# Patient Record
Sex: Female | Born: 1940 | Race: White | Hispanic: No | Marital: Married | Smoking: Never smoker
Health system: Southern US, Community
[De-identification: ages and names within clinical notes are randomized; demographics above are authoritative.]

## PROBLEM LIST (undated history)

## (undated) DIAGNOSIS — Z9989 Dependence on other enabling machines and devices: Secondary | ICD-10-CM

## (undated) DIAGNOSIS — A498 Other bacterial infections of unspecified site: Secondary | ICD-10-CM

## (undated) DIAGNOSIS — S72009A Fracture of unspecified part of neck of unspecified femur, initial encounter for closed fracture: Secondary | ICD-10-CM

## (undated) HISTORY — PX: SKIN BIOPSY: SHX1

## (undated) HISTORY — PX: TONSILLECTOMY: SUR1361

---

## 2006-10-15 ENCOUNTER — Ambulatory Visit (HOSPITAL_COMMUNITY): Admission: RE | Admit: 2006-10-15 | Discharge: 2006-10-15 | Payer: Self-pay | Admitting: Internal Medicine

## 2008-10-06 DIAGNOSIS — A498 Other bacterial infections of unspecified site: Secondary | ICD-10-CM

## 2008-10-06 HISTORY — DX: Other bacterial infections of unspecified site: A49.8

## 2012-11-23 ENCOUNTER — Other Ambulatory Visit (HOSPITAL_COMMUNITY): Payer: Self-pay | Admitting: Internal Medicine

## 2012-11-23 DIAGNOSIS — Z139 Encounter for screening, unspecified: Secondary | ICD-10-CM

## 2012-12-02 ENCOUNTER — Ambulatory Visit (HOSPITAL_COMMUNITY)
Admission: RE | Admit: 2012-12-02 | Discharge: 2012-12-02 | Disposition: A | Discharge status: Home / Self Care | Payer: Medicare Other | Source: Ambulatory Visit | Attending: Internal Medicine | Admitting: Internal Medicine

## 2012-12-02 DIAGNOSIS — Z1231 Encounter for screening mammogram for malignant neoplasm of breast: Secondary | ICD-10-CM | POA: Insufficient documentation

## 2012-12-02 DIAGNOSIS — Z139 Encounter for screening, unspecified: Secondary | ICD-10-CM

## 2012-12-20 ENCOUNTER — Inpatient Hospital Stay (HOSPITAL_COMMUNITY)
Admission: EM | Admit: 2012-12-20 | Discharge: 2012-12-24 | DRG: 481 | Disposition: A | Discharge status: Skilled Nursing Facility | Payer: Medicare Other | Attending: Internal Medicine | Admitting: Internal Medicine

## 2012-12-20 ENCOUNTER — Encounter (HOSPITAL_COMMUNITY): Payer: Self-pay | Admitting: *Deleted

## 2012-12-20 ENCOUNTER — Emergency Department (HOSPITAL_COMMUNITY): Payer: Medicare Other

## 2012-12-20 DIAGNOSIS — S72141A Displaced intertrochanteric fracture of right femur, initial encounter for closed fracture: Secondary | ICD-10-CM

## 2012-12-20 DIAGNOSIS — Z23 Encounter for immunization: Secondary | ICD-10-CM

## 2012-12-20 DIAGNOSIS — S72001A Fracture of unspecified part of neck of right femur, initial encounter for closed fracture: Secondary | ICD-10-CM

## 2012-12-20 DIAGNOSIS — S72009A Fracture of unspecified part of neck of unspecified femur, initial encounter for closed fracture: Principal | ICD-10-CM | POA: Diagnosis present

## 2012-12-20 DIAGNOSIS — W010XXA Fall on same level from slipping, tripping and stumbling without subsequent striking against object, initial encounter: Secondary | ICD-10-CM | POA: Diagnosis present

## 2012-12-20 DIAGNOSIS — S72141S Displaced intertrochanteric fracture of right femur, sequela: Secondary | ICD-10-CM

## 2012-12-20 DIAGNOSIS — Y92009 Unspecified place in unspecified non-institutional (private) residence as the place of occurrence of the external cause: Secondary | ICD-10-CM

## 2012-12-20 DIAGNOSIS — D62 Acute posthemorrhagic anemia: Secondary | ICD-10-CM | POA: Diagnosis not present

## 2012-12-20 HISTORY — DX: Other bacterial infections of unspecified site: A49.8

## 2012-12-20 NOTE — ED Notes (Signed)
Pt fell onto tile floor on rt side, co pain to rt hip, no leg deformities noted

## 2012-12-21 ENCOUNTER — Inpatient Hospital Stay (HOSPITAL_COMMUNITY): Payer: Medicare Other

## 2012-12-21 ENCOUNTER — Encounter (HOSPITAL_COMMUNITY)
Admission: EM | Disposition: A | Discharge status: Skilled Nursing Facility | Payer: Self-pay | Source: Home / Self Care | Attending: Internal Medicine

## 2012-12-21 ENCOUNTER — Encounter (HOSPITAL_COMMUNITY): Payer: Self-pay | Admitting: Internal Medicine

## 2012-12-21 ENCOUNTER — Encounter (HOSPITAL_COMMUNITY): Payer: Self-pay | Admitting: Anesthesiology

## 2012-12-21 ENCOUNTER — Inpatient Hospital Stay (HOSPITAL_COMMUNITY): Payer: Medicare Other | Admitting: Anesthesiology

## 2012-12-21 DIAGNOSIS — S72009A Fracture of unspecified part of neck of unspecified femur, initial encounter for closed fracture: Principal | ICD-10-CM

## 2012-12-21 DIAGNOSIS — S72141A Displaced intertrochanteric fracture of right femur, initial encounter for closed fracture: Secondary | ICD-10-CM

## 2012-12-21 HISTORY — PX: INTRAMEDULLARY (IM) NAIL INTERTROCHANTERIC: SHX5875

## 2012-12-21 LAB — BASIC METABOLIC PANEL
CO2: 25 mEq/L (ref 19–32)
Chloride: 103 mEq/L (ref 96–112)
GFR calc Af Amer: >90 mL/min (ref >90)

## 2012-12-21 LAB — TYPE AND SCREEN
ABO/RH(D): A POS
Antibody Screen: NEGATIVE

## 2012-12-21 LAB — URINALYSIS, MICROSCOPIC ONLY
Glucose, UA: NEGATIVE mg/dL
Ketones, ur: NEGATIVE mg/dL
Nitrite: NEGATIVE
Urobilinogen, UA: 0.2 mg/dL (ref 0.0–1.0)

## 2012-12-21 LAB — PROTIME-INR
INR: 0.92 (ref 0.00–1.49)
Prothrombin Time: 12.3 seconds (ref 11.6–15.2)

## 2012-12-21 LAB — CBC WITH DIFFERENTIAL/PLATELET
Basophils Relative: 0 % (ref 0–1)
Eosinophils Relative: 1 % (ref 0–5)
HCT: 42.7 % (ref 36.0–46.0)
Lymphocytes Relative: 19 % (ref 12–46)
Lymphs Abs: 1.8 10*3/uL (ref 0.7–4.0)
MCHC: 33.5 g/dL (ref 30.0–36.0)
Monocytes Absolute: 0.6 10*3/uL (ref 0.1–1.0)
RDW: 13.4 % (ref 11.5–15.5)

## 2012-12-21 LAB — SURGICAL PCR SCREEN
MRSA, PCR: NEGATIVE
Staphylococcus aureus: NEGATIVE

## 2012-12-21 SURGERY — FIXATION, FRACTURE, INTERTROCHANTERIC, WITH INTRAMEDULLARY ROD
Anesthesia: Spinal | Site: Hip | Laterality: Right | Wound class: Clean

## 2012-12-21 MED ORDER — CEFAZOLIN SODIUM-DEXTROSE 2-3 GM-% IV SOLR
INTRAVENOUS | Status: DC | PRN
  Administered 2012-12-21: 2 g via INTRAVENOUS

## 2012-12-21 MED ORDER — FENTANYL CITRATE 0.05 MG/ML IJ SOLN
25.0000 ug | INTRAMUSCULAR | Status: DC | PRN
  Administered 2012-12-21: 25 ug via INTRAVENOUS

## 2012-12-21 MED ORDER — ENOXAPARIN SODIUM 40 MG/0.4ML ~~LOC~~ SOLN
40.0000 mg | SUBCUTANEOUS | Status: DC
  Administered 2012-12-22 – 2012-12-24 (×3): 40 mg via SUBCUTANEOUS
  Filled 2012-12-21 (×3): qty 0.4

## 2012-12-21 MED ORDER — CHLORHEXIDINE GLUCONATE 4 % EX LIQD
60.0000 mL | Freq: Once | CUTANEOUS | Status: DC
  Filled 2012-12-21: qty 60

## 2012-12-21 MED ORDER — CEFAZOLIN SODIUM-DEXTROSE 2-3 GM-% IV SOLR
INTRAVENOUS | Status: AC
  Filled 2012-12-21: qty 50

## 2012-12-21 MED ORDER — METOCLOPRAMIDE HCL 5 MG/ML IJ SOLN: 5.0000 mg | Freq: Three times a day (TID) | INTRAMUSCULAR | Status: DC | PRN

## 2012-12-21 MED ORDER — DOCUSATE SODIUM 100 MG PO CAPS
100.0000 mg | ORAL_CAPSULE | Freq: Two times a day (BID) | ORAL | Status: DC
  Filled 2012-12-21: qty 1

## 2012-12-21 MED ORDER — METHOCARBAMOL 100 MG/ML IJ SOLN
500.0000 mg | Freq: Four times a day (QID) | INTRAVENOUS | Status: DC | PRN
  Filled 2012-12-21: qty 5

## 2012-12-21 MED ORDER — PROPOFOL INFUSION 10 MG/ML OPTIME
INTRAVENOUS | Status: DC | PRN
  Administered 2012-12-21: 25 ug/kg/min via INTRAVENOUS

## 2012-12-21 MED ORDER — SENNA 8.6 MG PO TABS
1.0000 | ORAL_TABLET | Freq: Two times a day (BID) | ORAL | Status: DC
  Administered 2012-12-21 – 2012-12-24 (×5): 8.6 mg via ORAL
  Filled 2012-12-21 (×5): qty 1

## 2012-12-21 MED ORDER — ONDANSETRON HCL 4 MG/2ML IJ SOLN: 4.0000 mg | Freq: Four times a day (QID) | INTRAMUSCULAR | Status: DC | PRN

## 2012-12-21 MED ORDER — MIDAZOLAM HCL 2 MG/2ML IJ SOLN
INTRAMUSCULAR | Status: AC
  Filled 2012-12-21: qty 2

## 2012-12-21 MED ORDER — PNEUMOCOCCAL VAC POLYVALENT 25 MCG/0.5ML IJ INJ
0.5000 mL | INJECTION | INTRAMUSCULAR | Status: DC
  Filled 2012-12-21: qty 0.5

## 2012-12-21 MED ORDER — FENTANYL CITRATE 0.05 MG/ML IJ SOLN
INTRAMUSCULAR | Status: DC | PRN
  Administered 2012-12-21: 25 ug via INTRATHECAL

## 2012-12-21 MED ORDER — HYDROCODONE-ACETAMINOPHEN 7.5-325 MG PO TABS
1.0000 | ORAL_TABLET | Freq: Once | ORAL | Status: AC
  Administered 2012-12-21: 1 via ORAL

## 2012-12-21 MED ORDER — EPHEDRINE SULFATE 50 MG/ML IJ SOLN
INTRAMUSCULAR | Status: DC | PRN
  Administered 2012-12-21 (×2): 10 mg via INTRAVENOUS

## 2012-12-21 MED ORDER — CEFAZOLIN SODIUM-DEXTROSE 2-3 GM-% IV SOLR: 2.0000 g | INTRAVENOUS | Status: DC

## 2012-12-21 MED ORDER — HYDROMORPHONE HCL PF 1 MG/ML IJ SOLN
1.0000 mg | INTRAMUSCULAR | Status: DC | PRN
  Administered 2012-12-21 (×2): 1 mg via INTRAVENOUS
  Filled 2012-12-21 (×2): qty 1

## 2012-12-21 MED ORDER — HYDROCODONE-ACETAMINOPHEN 7.5-325 MG PO TABS
ORAL_TABLET | ORAL | Status: AC
  Filled 2012-12-21: qty 1

## 2012-12-21 MED ORDER — ONDANSETRON HCL 4 MG/2ML IJ SOLN
4.0000 mg | Freq: Four times a day (QID) | INTRAMUSCULAR | Status: DC | PRN
  Administered 2012-12-22: 4 mg via INTRAVENOUS
  Filled 2012-12-21: qty 2

## 2012-12-21 MED ORDER — SENNOSIDES-DOCUSATE SODIUM 8.6-50 MG PO TABS
1.0000 | ORAL_TABLET | Freq: Every evening | ORAL | Status: DC | PRN
  Administered 2012-12-22: 1 via ORAL
  Filled 2012-12-21: qty 1

## 2012-12-21 MED ORDER — HYDROCODONE-ACETAMINOPHEN 5-325 MG PO TABS
1.0000 | ORAL_TABLET | Freq: Four times a day (QID) | ORAL | Status: DC | PRN
  Administered 2012-12-22 (×2): 1 via ORAL
  Filled 2012-12-21 (×3): qty 1

## 2012-12-21 MED ORDER — LACTATED RINGERS IV SOLN
INTRAVENOUS | Status: DC
  Administered 2012-12-21 (×2): via INTRAVENOUS

## 2012-12-21 MED ORDER — DEXTROSE-NACL 5-0.9 % IV SOLN
INTRAVENOUS | Status: DC
  Administered 2012-12-21: 03:00:00 via INTRAVENOUS

## 2012-12-21 MED ORDER — ONDANSETRON HCL 4 MG/2ML IJ SOLN
4.0000 mg | Freq: Once | INTRAMUSCULAR | Status: AC
  Administered 2012-12-21: 4 mg via INTRAVENOUS
  Filled 2012-12-21: qty 2

## 2012-12-21 MED ORDER — FENTANYL CITRATE 0.05 MG/ML IJ SOLN
INTRAMUSCULAR | Status: AC
  Filled 2012-12-21: qty 2

## 2012-12-21 MED ORDER — ACETAMINOPHEN 10 MG/ML IV SOLN
500.0000 mg | Freq: Once | INTRAVENOUS | Status: AC
  Administered 2012-12-21: 500 mg via INTRAVENOUS

## 2012-12-21 MED ORDER — BUPIVACAINE IN DEXTROSE 0.75-8.25 % IT SOLN
INTRATHECAL | Status: AC
  Filled 2012-12-21: qty 2

## 2012-12-21 MED ORDER — SODIUM CHLORIDE 0.9 % IR SOLN
Status: DC | PRN
  Administered 2012-12-21: 1000 mL

## 2012-12-21 MED ORDER — MUPIROCIN 2 % EX OINT
TOPICAL_OINTMENT | Freq: Two times a day (BID) | CUTANEOUS | Status: DC
  Administered 2012-12-21: 14:00:00 via NASAL

## 2012-12-21 MED ORDER — ONDANSETRON HCL 4 MG/2ML IJ SOLN: 4.0000 mg | Freq: Four times a day (QID) | INTRAMUSCULAR | Status: DC

## 2012-12-21 MED ORDER — HYDROCODONE-ACETAMINOPHEN 5-325 MG PO TABS: 1.0000 | ORAL_TABLET | ORAL | Status: DC | PRN

## 2012-12-21 MED ORDER — SODIUM CHLORIDE 0.9 % IV SOLN: 1000.0000 mL | INTRAVENOUS | Status: DC

## 2012-12-21 MED ORDER — PHENOL 1.4 % MT LIQD: 1.0000 | OROMUCOSAL | Status: DC | PRN

## 2012-12-21 MED ORDER — ACETAMINOPHEN 10 MG/ML IV SOLN
INTRAVENOUS | Status: AC
  Filled 2012-12-21: qty 100

## 2012-12-21 MED ORDER — CEFAZOLIN SODIUM-DEXTROSE 2-3 GM-% IV SOLR
INTRAVENOUS | Status: AC
  Filled 2012-12-21: qty 100

## 2012-12-21 MED ORDER — FENTANYL CITRATE 0.05 MG/ML IJ SOLN
50.0000 ug | INTRAMUSCULAR | Status: DC | PRN
  Administered 2012-12-21: 50 ug via INTRAVENOUS
  Filled 2012-12-21: qty 2

## 2012-12-21 MED ORDER — BUPIVACAINE-EPINEPHRINE PF 0.5-1:200000 % IJ SOLN
INTRAMUSCULAR | Status: AC
  Filled 2012-12-21: qty 20

## 2012-12-21 MED ORDER — ONDANSETRON HCL 4 MG PO TABS: 4.0000 mg | ORAL_TABLET | Freq: Four times a day (QID) | ORAL | Status: DC | PRN

## 2012-12-21 MED ORDER — ASPIRIN EC 81 MG PO TBEC: 81.0000 mg | DELAYED_RELEASE_TABLET | Freq: Every day | ORAL | Status: DC

## 2012-12-21 MED ORDER — MUPIROCIN 2 % EX OINT
TOPICAL_OINTMENT | CUTANEOUS | Status: AC
  Filled 2012-12-21: qty 22

## 2012-12-21 MED ORDER — BUPIVACAINE IN DEXTROSE 0.75-8.25 % IT SOLN
INTRATHECAL | Status: DC | PRN
  Administered 2012-12-21: 15 mg via INTRATHECAL

## 2012-12-21 MED ORDER — MIDAZOLAM HCL 2 MG/2ML IJ SOLN
1.0000 mg | INTRAMUSCULAR | Status: DC | PRN
  Administered 2012-12-21: 2 mg via INTRAVENOUS

## 2012-12-21 MED ORDER — METHOCARBAMOL 500 MG PO TABS
500.0000 mg | ORAL_TABLET | Freq: Four times a day (QID) | ORAL | Status: DC | PRN
  Administered 2012-12-22 – 2012-12-24 (×7): 500 mg via ORAL
  Filled 2012-12-21 (×9): qty 1

## 2012-12-21 MED ORDER — METOCLOPRAMIDE HCL 10 MG PO TABS: 5.0000 mg | ORAL_TABLET | Freq: Three times a day (TID) | ORAL | Status: DC | PRN

## 2012-12-21 MED ORDER — CEFAZOLIN SODIUM-DEXTROSE 2-3 GM-% IV SOLR
2.0000 g | Freq: Four times a day (QID) | INTRAVENOUS | Status: AC
  Administered 2012-12-21 – 2012-12-22 (×2): 2 g via INTRAVENOUS
  Filled 2012-12-21 (×2): qty 50

## 2012-12-21 MED ORDER — MAGNESIUM CITRATE PO SOLN: 1.0000 | Freq: Once | ORAL | Status: AC | PRN

## 2012-12-21 MED ORDER — PHENYLEPHRINE HCL 10 MG/ML IJ SOLN
INTRAMUSCULAR | Status: DC | PRN
  Administered 2012-12-21: 30 ug via INTRAVENOUS
  Administered 2012-12-21: 50 ug via INTRAVENOUS
  Administered 2012-12-21: 20 ug via INTRAVENOUS
  Administered 2012-12-21: 50 ug via INTRAVENOUS

## 2012-12-21 MED ORDER — DOCUSATE SODIUM 100 MG PO CAPS
100.0000 mg | ORAL_CAPSULE | Freq: Two times a day (BID) | ORAL | Status: DC
  Administered 2012-12-21 – 2012-12-24 (×6): 100 mg via ORAL
  Filled 2012-12-21 (×6): qty 1

## 2012-12-21 MED ORDER — BUPIVACAINE-EPINEPHRINE 0.5% -1:200000 IJ SOLN
INTRAMUSCULAR | Status: DC | PRN
  Administered 2012-12-21: 60 mL

## 2012-12-21 MED ORDER — FENTANYL CITRATE 0.05 MG/ML IJ SOLN: 25.0000 ug | INTRAMUSCULAR | Status: DC | PRN

## 2012-12-21 MED ORDER — EPHEDRINE SULFATE 50 MG/ML IJ SOLN
INTRAMUSCULAR | Status: AC
  Filled 2012-12-21: qty 1

## 2012-12-21 MED ORDER — BISACODYL 5 MG PO TBEC: 5.0000 mg | DELAYED_RELEASE_TABLET | Freq: Every day | ORAL | Status: DC | PRN

## 2012-12-21 MED ORDER — PROPOFOL 10 MG/ML IV EMUL
INTRAVENOUS | Status: AC
  Filled 2012-12-21: qty 20

## 2012-12-21 MED ORDER — ONDANSETRON HCL 4 MG/2ML IJ SOLN
4.0000 mg | Freq: Once | INTRAMUSCULAR | Status: AC | PRN
  Administered 2012-12-21: 4 mg via INTRAVENOUS

## 2012-12-21 MED ORDER — HYDROMORPHONE HCL PF 1 MG/ML IJ SOLN: 0.1000 mg | INTRAMUSCULAR | Status: DC | PRN

## 2012-12-21 MED ORDER — MENTHOL 3 MG MT LOZG: 1.0000 | LOZENGE | OROMUCOSAL | Status: DC | PRN

## 2012-12-21 MED ORDER — LACTATED RINGERS IV SOLN
INTRAVENOUS | Status: DC
  Administered 2012-12-21 – 2012-12-23 (×4): via INTRAVENOUS

## 2012-12-21 MED ORDER — SODIUM CHLORIDE 0.9 % IV SOLN
1000.0000 mL | INTRAVENOUS | Status: DC
  Administered 2012-12-21: 1000 mL via INTRAVENOUS

## 2012-12-21 MED ORDER — HYDROCODONE-ACETAMINOPHEN 5-325 MG PO TABS
1.0000 | ORAL_TABLET | ORAL | Status: DC
  Administered 2012-12-21 – 2012-12-22 (×8): 1 via ORAL
  Filled 2012-12-21 (×7): qty 1

## 2012-12-21 MED ORDER — ALUM & MAG HYDROXIDE-SIMETH 200-200-20 MG/5ML PO SUSP: 30.0000 mL | ORAL | Status: DC | PRN

## 2012-12-21 MED ORDER — ONDANSETRON HCL 4 MG/2ML IJ SOLN
INTRAMUSCULAR | Status: AC
  Filled 2012-12-21: qty 2

## 2012-12-21 SURGICAL SUPPLY — 50 items
BAG HAMPER (MISCELLANEOUS) ×2 IMPLANT
BANDAGE GAUZE ELAST BULKY 4 IN (GAUZE/BANDAGES/DRESSINGS) IMPLANT
BIT DRILL AO GAMMA 4.2X300 (BIT) ×2 IMPLANT
BLADE SURG SZ10 CARB STEEL (BLADE) ×4 IMPLANT
CHLORAPREP W/TINT 26ML (MISCELLANEOUS) ×2 IMPLANT
CLOTH BEACON ORANGE TIMEOUT ST (SAFETY) ×2 IMPLANT
COVER LIGHT HANDLE STERIS (MISCELLANEOUS) ×4 IMPLANT
COVER MAYO STAND XLG (DRAPE) ×2 IMPLANT
DECANTER SPIKE VIAL GLASS SM (MISCELLANEOUS) ×4 IMPLANT
DRAPE STERI IOBAN 125X83 (DRAPES) ×2 IMPLANT
DRSG MEPILEX BORDER 4X12 (GAUZE/BANDAGES/DRESSINGS) ×2 IMPLANT
ELECT REM PT RETURN 9FT ADLT (ELECTROSURGICAL) ×2
ELECTRODE REM PT RTRN 9FT ADLT (ELECTROSURGICAL) ×1 IMPLANT
GLOVE ECLIPSE 6.5 STRL STRAW (GLOVE) ×2 IMPLANT
GLOVE ECLIPSE 7.0 STRL STRAW (GLOVE) ×2 IMPLANT
GLOVE EXAM NITRILE MD LF STRL (GLOVE) ×2 IMPLANT
GLOVE INDICATOR 7.0 STRL GRN (GLOVE) ×2 IMPLANT
GLOVE INDICATOR 7.5 STRL GRN (GLOVE) ×2 IMPLANT
GLOVE SKINSENSE NS SZ8.0 LF (GLOVE) ×1
GLOVE SKINSENSE STRL SZ8.0 LF (GLOVE) ×1 IMPLANT
GLOVE SS N UNI LF 8.5 STRL (GLOVE) ×2 IMPLANT
GOWN STRL REIN XL XLG (GOWN DISPOSABLE) ×8 IMPLANT
GUIDEROD T2 3X1000 (ROD) ×2 IMPLANT
INST SET MAJOR BONE (KITS) ×2 IMPLANT
K-WIRE  3.2X450M STR (WIRE) ×1
K-WIRE 3.2X450M STR (WIRE) ×1
KIT BLADEGUARD II DBL (SET/KITS/TRAYS/PACK) ×2 IMPLANT
KIT ROOM TURNOVER AP CYSTO (KITS) ×2 IMPLANT
KWIRE 3.2X450M STR (WIRE) ×1 IMPLANT
MANIFOLD NEPTUNE II (INSTRUMENTS) ×2 IMPLANT
MARKER SKIN DUAL TIP RULER LAB (MISCELLANEOUS) ×2 IMPLANT
NAIL TROCH GAMMA 11X18 (Nail) ×2 IMPLANT
NEEDLE HYPO 21X1.5 SAFETY (NEEDLE) ×2 IMPLANT
NEEDLE SPNL 18GX3.5 QUINCKE PK (NEEDLE) ×2 IMPLANT
NS IRRIG 1000ML POUR BTL (IV SOLUTION) ×2 IMPLANT
PACK BASIC III (CUSTOM PROCEDURE TRAY) ×1
PACK SRG BSC III STRL LF ECLPS (CUSTOM PROCEDURE TRAY) ×1 IMPLANT
PENCIL HANDSWITCHING (ELECTRODE) ×2 IMPLANT
SCREW LAG GAMMA 3 TI 10.5X80MM (Screw) ×2 IMPLANT
SCREW LOCKING T2 F/T  5X32.5MM (Screw) ×1 IMPLANT
SCREW LOCKING T2 F/T 5X32.5MM (Screw) ×1 IMPLANT
SET BASIN LINEN APH (SET/KITS/TRAYS/PACK) ×2 IMPLANT
SPONGE LAP 18X18 X RAY DECT (DISPOSABLE) ×4 IMPLANT
STAPLER VISISTAT 35W (STAPLE) ×2 IMPLANT
SUT MNCRL 0 VIOLET CTX 36 (SUTURE) ×2 IMPLANT
SUT MON AB 2-0 CT1 36 (SUTURE) ×2 IMPLANT
SUT MONOCRYL 0 CTX 36 (SUTURE) ×2
SYR 30ML LL (SYRINGE) ×2 IMPLANT
SYR BULB IRRIGATION 50ML (SYRINGE) ×4 IMPLANT
YANKAUER SUCT 12FT TUBE ARGYLE (SUCTIONS) ×2 IMPLANT

## 2012-12-21 NOTE — Anesthesia Postprocedure Evaluation (Signed)
  Anesthesia Post-op Note  Patient: Angela [name]  Procedure(s) Performed: Procedure(s): INTRAMEDULLARY (IM) NAIL INTERTROCHANTRIC (Right)  Patient Location: PACU  Anesthesia Type:Spinal  Level of Consciousness: awake, alert  and patient cooperative  Airway and Oxygen Therapy: Patient Spontanous Breathing  Post-op Pain: 4 /10, mild  Post-op Assessment: Post-op Vital signs reviewed, Patient's Cardiovascular Status Stable, Respiratory Function Stable, RESPIRATORY FUNCTION UNSTABLE, No signs of Nausea or vomiting and Pain level controlled  Post-op Vital Signs: Reviewed and stable  Complications: No apparent anesthesia complications

## 2012-12-21 NOTE — Brief Op Note (Signed)
12/20/2012 - 12/21/2012  4:08 PM  PATIENT:  Angela [name]  72 y.o. female  PRE-OPERATIVE DIAGNOSIS:  right hip fracture, intertrochanteric   POST-OPERATIVE DIAGNOSIS:  right hip fracture, intertrochanteric  PROCEDURE:  Procedure(s): INTRAMEDULLARY (IM) NAIL INTERTROCHANTRIC (Right) Short gamma nail 125 / 80 / 32.5 / proximal screw SURGEON:  Surgeon(s) and Role:    * Vickki Hearing, MD - Primary  PHYSICIAN ASSISTANT:   ASSISTANTS: betty ashley   ANESTHESIA:   spinal  EBL:  Total I/O In: 2187.5 [I.V.:2187.5] Out: 1175 [Urine:1100; Blood:75]  BLOOD ADMINISTERED:none  DRAINS: none   LOCAL MEDICATIONS USED:  MARCAINE   , Amount: 60 ml and OTHER epi  SPECIMEN:  No Specimen  DISPOSITION OF SPECIMEN:  N/A  COUNTS:  YES  TOURNIQUET:  * No tourniquets in log *  DICTATION: .Dragon Dictation  PLAN OF CARE: Admit to inpatient   PATIENT DISPOSITION:  PACU - hemodynamically stable.   Delay start of Pharmacological VTE agent (>24hrs) due to surgical blood loss or risk of bleeding: yes

## 2012-12-21 NOTE — Op Note (Signed)
  After site marking and chart update the patient was taken to the operating room where she received spinal anesthesia. The patient was placed on the fracture table with perineal post. Right leg placed in traction device left leg placed in a padded foot holder and placed in abduction   In traction and manipulation of the limb was performed under C-arm guidance until a closed stable reduction was obtained. The right leg was then prepped and draped sterilely. After completing the timeout a lateral incision was made over the greater trochanter extended proximally. Subcutaneous tissues were divided and the subcutaneous hemorrhage was controlled with electrocautery. Further dissection was carried out to the fascia which was split in line with the skin incision and then blunt dissection was carried out in the abductor musculature into the greater trochanter was palpated. A curved awl was passed into the greater trochanter into the femoral canal. This was confirmed by x-ray. A guidewire was placed into the cannulated awl and passed to the knee and confirmed by x-ray.   Using manufacturer's recommended technique reaming was performed over the guidewire to the level of the lesser trochanter. A 125 nail was passed over the guidewire,  . The nail was then passed . A lateral incision was made in preparation for the lag screw. Using the cannula and perforating drill the guidewire was placed across the fracture site into the femoral head. X-rays confirmed position. The pin was then measured. A 80 mm setting was placed on the reamer and the reamer was passed over the guidewire under C-arm guidance. The 80 mm lag screw was placed. The acorn was then placed proximally. The acorn was placed in a dynamic mode. The acorn was confirmed to be engaged per manufacture a technique.  Traction was then released.  We then made a second stab wound laterally, the cannula for the locking bolt was placed down to bone and the recommended  drill was drilled across the nail. We then passed a 32.5 m locking bolt per direct measurement.   Wounds were then irrigated radiographs confirmed position of the fracture and of the implant.  Layered closure was performed proximally with 0 Monocryl and 2-0 Monocryl followed by reapproximation of skin edges with staples  The 2 distal incisions were closed with staples.  A total of 60 cc of Marcaine was injected into the wound area with 30 cc injected beneath the fascial layer and 30 cc in the subcutaneous layer.  Sterile dressing was applied  The patient was taken to recovery room in stable condition  Postop plan is for full weightbearing.

## 2012-12-21 NOTE — H&P (Signed)
[facility] History and Physical  Angela [name] ZOX:[id number] DOB: 12-08-1940    PCP:   Dwana Melena, MD   Chief Complaint: Mechanical fall, right hip Fx.  HPI: Angela [name] is an 72 y.o. female previously healthy with only home medicine of 81mg  ASA, was walking over the gate at home, holding her dog, tripped and fell on her right hip.  She has pain there.  X ray showed non displaced intertrochanteric fx of the right femoral neck.  Her other work up included a clear CXR, EKG with SNR and no acute ischemic changes, her Hb of 14g/DL, and her renal fx tests were normal.  Dr Monico Blitz of orthopedics was consulted and will see her in the morning.  Patient would like to have Dr Romeo Apple operated on her and Dr Monico Blitz is aware of her request.  Hospitalist was asked to admit her for right hip Fx.  Rewiew of Systems:  Constitutional: Negative for malaise, fever and chills. No significant weight loss or weight gain Eyes: Negative for eye pain, redness and discharge, diplopia, visual changes, or flashes of light. ENMT: Negative for ear pain, hoarseness, nasal congestion, sinus pressure and sore throat. No headaches; tinnitus, drooling, or problem swallowing. Cardiovascular: Negative for chest pain, palpitations, diaphoresis, dyspnea and peripheral edema. ; No orthopnea, PND Respiratory: Negative for cough, hemoptysis, wheezing and stridor. No pleuritic chestpain. Gastrointestinal: Negative for nausea, vomiting, diarrhea, constipation, abdominal pain, melena, blood in stool, hematemesis, jaundice and rectal bleeding.    Genitourinary: Negative for frequency, dysuria, incontinence,flank pain and hematuria; Musculoskeletal: Negative for back pain and neck pain. Negative for swelling and trauma.;  Skin: . Negative for pruritus, rash, abrasions, bruising and skin lesion.; ulcerations Neuro: Negative for headache, lightheadedness and neck stiffness. Negative for weakness, altered level of consciousness ,  altered mental status, extremity weakness, burning feet, involuntary movement, seizure and syncope.  Psych: negative for anxiety, depression, insomnia, tearfulness, panic attacks, hallucinations, paranoia, suicidal or homicidal ideation    Past Medical History  Diagnosis Date  . Clostridium difficile infection 2010    History reviewed. No pertinent past surgical history.  Medications:  HOME MEDS: Prior to Admission medications   Medication Sig Start Date End Date Taking? Authorizing Provider  aspirin 81 MG tablet Take 81 mg by mouth daily.   Yes Historical Provider, MD     Allergies:  No Known Allergies  Social History:   reports that she has never smoked. She does not have any smokeless tobacco history on file. She reports that she does not drink alcohol or use illicit drugs.  Family History: History reviewed. No pertinent family history.   Physical Exam: Filed Vitals:   12/20/12 2305 12/20/12 2310 12/21/12 0245  BP:  146/88 135/67  Pulse:  77 73  Temp: 98 F (36.7 C) 98 F (36.7 C) 98.1 F (36.7 C)  TempSrc: Oral Oral Oral  Resp:  20 15  Height:  5' (1.524 m)   Weight:  55.339 kg (122 lb)   SpO2:  97% 98%   Blood pressure 135/67, pulse 73, temperature 98.1 F (36.7 C), temperature source Oral, resp. rate 15, height 5' (1.524 m), weight 55.339 kg (122 lb), SpO2 98.00%.  GEN:  Pleasant  patient lying in the stretcher in no acute distress; cooperative with exam. PSYCH:  alert and oriented x4; does not appear anxious or depressed; affect is appropriate. HEENT: Mucous membranes pink and anicteric; PERRLA; EOM intact; no cervical lymphadenopathy nor thyromegaly or carotid bruit; no JVD; There were no  stridor. Neck is very supple. Breasts:: Not examined CHEST WALL: No tenderness CHEST: Normal respiration, clear to auscultation bilaterally.  HEART: Regular rate and rhythm.  There are no murmur, rub, or gallops.   BACK: No kyphosis or scoliosis; no CVA  tenderness ABDOMEN: soft and non-tender; no masses, no organomegaly, normal abdominal bowel sounds; no pannus; no intertriginous candida. There is no rebound and no distention. Rectal Exam: Not done EXTREMITIES: No bone or joint deformity; age-appropriate arthropathy of the hands and knees; no edema; no ulcerations.  There is no calf tenderness.  Her right leg is not shorter but is slightly externally rotated. Genitalia: not examined PULSES: 2+ and symmetric SKIN: Normal hydration no rash or ulceration CNS: Cranial nerves 2-12 grossly intact no focal lateralizing neurologic deficit.  Speech is fluent; uvula elevated with phonation, facial symmetry and tongue midline. DTR are normal bilaterally, cerebella exam is intact, barbinski is negative and strengths are equaled bilaterally.  No sensory loss.   Labs on Admission:  Basic Metabolic Panel:  Recent Labs Lab 12/21/12 0033  NA 139  K 3.8  CL 103  CO2 25  GLUCOSE 111*  BUN 18  CREATININE 0.72  CALCIUM 9.1   Liver Function Tests: No results found for this basename: AST, ALT, ALKPHOS, BILITOT, PROT, ALBUMIN,  in the last 168 hours No results found for this basename: LIPASE, AMYLASE,  in the last 168 hours No results found for this basename: AMMONIA,  in the last 168 hours CBC:  Recent Labs Lab 12/21/12 0033  WBC 9.0  NEUTROABS 6.5  HGB 14.3  HCT 42.7  MCV 89.3  PLT 229   Cardiac Enzymes: No results found for this basename: CKTOTAL, CKMB, CKMBINDEX, TROPONINI,  in the last 168 hours  CBG: No results found for this basename: GLUCAP,  in the last 168 hours   Radiological Exams on Admission: Dg Chest 1 View  12/21/2012  *RADIOLOGY REPORT*  Clinical Data: Intertrochanteric right femoral neck fracture. Preoperative respiratory evaluation.  CHEST - 1 VIEW  Comparison: None.  Findings: Cardiomediastinal silhouette unremarkable for the AP supine technique.  Suboptimal inspiration accounts for crowded bronchovascular markings  diffusely.  Taking this into account, lungs clear.  IMPRESSION: Suboptimal inspiration.  No acute cardiopulmonary disease.   Original Report Authenticated By: Hulan Saas, M.D.    Dg Hip Complete Right  12/21/2012  *RADIOLOGY REPORT*  Clinical Data:  Larey Seat at home, injuring the right hip.  RIGHT HIP - COMPLETE 2+ VIEW  Comparison: None.  Findings: Intertrochanteric right femoral neck fracture without significant displacement.  Severe joint space narrowing in the right hip with protrusio deformity.  Included AP pelvis demonstrates a normal-appearing contralateral left hip.  Sacroiliac joints intact with mild degenerative changes. Symphysis pubis intact.  Bone mineral density well preserved. Degenerative changes involving the visualized lower lumbar spine.  IMPRESSION: Nondisplaced intertrochanteric right femoral neck fracture.  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   Original Report Authenticated By: Hulan Saas, M.D.     EKG: Independently reviewed. NSR no acute ischemic changes.   Assessment/Plan Right hip Fx.  PLAN:  Will admit her for mechanical fall with right hip Fx.  Ortho has been consulted by EDP and Dr Monico Blitz will see her.  She would like to have Dr Romeo Apple operated on her, and Dr Monico Blitz is aware of her request.  Accepting a slight increased risk for cardiovascular complication, she is clear for surgery.  She is stable, full code, and will be admitted to [facility] service.  Thank you for allowing me to partake in the care of your nice patient.    Other plans as per orders.  Code Status: FULL Unk Lightning, MD. [facility] Pager [phone] 7pm to 7am.  12/21/2012, 2:58 AM

## 2012-12-21 NOTE — ED Provider Notes (Signed)
History     CSN: [id number]  Arrival date & time 12/20/12  2310   First MD Initiated Contact with Patient 12/20/12 2330      Chief Complaint  Patient presents with  . Fall  . Hip Pain    Rt hip    (Consider location/radiation/quality/duration/timing/severity/associated sxs/prior treatment) HPI Comments: 72 year old female with a history of no significant medical problems, takes very few home medications, only a baby aspirin daily who presents after falling on her right side. This occurred just prior to arrival, was acute in onset, constant severe 10 out of 10 pain in her right hip and prevented her from ambulating. Paramedics were called and transported the patient on a backboard with some slight flexion to her hip with a pillow. Her leg is shortened and rotated, pain is worse with movement of the leg. She denies head injury, neck pain or numbness or weakness. She denies history of broken bones or operative orthopedic procedures  Patient is a 72 y.o. female presenting with fall and hip pain. The history is provided by the patient, the spouse and the EMS personnel.  Fall  Hip Pain    Past Medical History  Diagnosis Date  . Clostridium difficile infection 2010    History reviewed. No pertinent past surgical history.  History reviewed. No pertinent family history.  History  Substance Use Topics  . Smoking status: Never Smoker   . Smokeless tobacco: Not on file  . Alcohol Use: No    OB History   Grav Para Term Preterm Abortions TAB SAB Ect Mult Living                  Review of Systems  All other systems reviewed and are negative.    Allergies  Review of patient's allergies indicates no known allergies.  Home Medications   Current Outpatient Rx  Name  Route  Sig  Dispense  Refill  . aspirin 81 MG tablet   Oral   Take 81 mg by mouth daily.           BP 146/88  Pulse 77  Temp(Src) 98 F (36.7 C) (Oral)  Resp 20  Ht 5' (1.524 m)  Wt 122 lb (55.339  kg)  BMI 23.83 kg/m2  SpO2 97%  Physical Exam  Nursing note and vitals reviewed. Constitutional: She appears well-developed and well-nourished. No distress.  HENT:  Head: Normocephalic and atraumatic.  Mouth/Throat: Oropharynx is clear and moist. No oropharyngeal exudate.  Eyes: Conjunctivae and EOM are normal. Pupils are equal, round, and reactive to light. Right eye exhibits no discharge. Left eye exhibits no discharge. No scleral icterus.  Neck: Normal range of motion. Neck supple. No JVD present. No thyromegaly present.  Cardiovascular: Normal rate, regular rhythm, normal heart sounds and intact distal pulses.  Exam reveals no gallop and no friction rub.   No murmur heard. Pulmonary/Chest: Effort normal and breath sounds normal. No respiratory distress. She has no wheezes. She has no rales.  Abdominal: Soft. Bowel sounds are normal. She exhibits no distension and no mass. There is no tenderness.  Musculoskeletal: Normal range of motion. She exhibits tenderness ( Tender to palpation over the right trochanter, pain with range of motion of the internal and external rotation of the right hip, pain with flexion of the right hip. No tenderness or deformity of the right knee). She exhibits no edema.  Normal appearing upper extremities, left lower extremity.  Lymphadenopathy:    She has no cervical adenopathy.  Neurological:  She is alert. Coordination normal.  Neurologically intact distal to the right hip  Skin: Skin is warm and dry. No rash noted. No erythema.  Psychiatric: She has a normal mood and affect. Her behavior is normal.    ED Course  Procedures (including critical care time)  Labs Reviewed  BASIC METABOLIC PANEL  CBC WITH DIFFERENTIAL  PROTIME-INR  URINALYSIS, MICROSCOPIC ONLY  TYPE AND SCREEN   Dg Chest 1 View  12/21/2012  *RADIOLOGY REPORT*  Clinical Data: Intertrochanteric right femoral neck fracture. Preoperative respiratory evaluation.  CHEST - 1 VIEW  Comparison:  None.  Findings: Cardiomediastinal silhouette unremarkable for the AP supine technique.  Suboptimal inspiration accounts for crowded bronchovascular markings diffusely.  Taking this into account, lungs clear.  IMPRESSION: Suboptimal inspiration.  No acute cardiopulmonary disease.   Original Report Authenticated By: Hulan Saas, M.D.    Dg Hip Complete Right  12/21/2012  *RADIOLOGY REPORT*  Clinical Data:  Larey Seat at home, injuring the right hip.  RIGHT HIP - COMPLETE 2+ VIEW  Comparison: None.  Findings: Intertrochanteric right femoral neck fracture without significant displacement.  Severe joint space narrowing in the right hip with protrusio deformity.  Included AP pelvis demonstrates a normal-appearing contralateral left hip.  Sacroiliac joints intact with mild degenerative changes. Symphysis pubis intact.  Bone mineral density well preserved. Degenerative changes involving the visualized lower lumbar spine.  IMPRESSION: Nondisplaced intertrochanteric right femoral neck fracture.  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   Original Report Authenticated By: Hulan Saas, M.D.      1. Hip fracture, right, closed, initial encounter       MDM  I have personally interpreted the hip x-rays, there appears to be a fracture of the right femoral neck. There is no hip dislocation  Care discussed with Dr. Ninetta Lights who will see the patient in the morning, Dr. Conley Rolls of the hospitalist service will admit the patient to the hospital. Labs pending, patient stable  On exam the patient has no neurovascular compromise to the distal extremity on the right, she is otherwise a very healthy person and can be admitted to internal medicine service with orthopedic consultation for surgery. Consultation paged.  Pt requests Dr. Romeo Apple  ED ECG REPORT  I personally interpreted this EKG   Date: 12/21/2012   Rate: 75  Rhythm: normal sinus rhythm  QRS Axis: normal  Intervals: normal  ST/T Wave  abnormalities: normal  Conduction Disutrbances:none  Narrative Interpretation:   Old EKG Reviewed: none available     Vida Roller, MD 12/21/12 [phone]

## 2012-12-21 NOTE — Progress Notes (Signed)
Patient ID: Angela [name], female   DOB: 1941-04-19, 71 y.o.   MRN: [id number] Consult has been requested by Dr. Karilyn Cota Found out about consult at approximately 11  Just have  finished in the office I will see the patient and I have tentatively scheduled her for surgery for today  I will discuss the surgical procedure with the patient prior to getting the consent

## 2012-12-21 NOTE — Transfer of Care (Signed)
Immediate Anesthesia Transfer of Care Note  Patient: Angela [name]  Procedure(s) Performed: Procedure(s): INTRAMEDULLARY (IM) NAIL INTERTROCHANTRIC (Right)  Patient Location: PACU  Anesthesia Type:Spinal  Level of Consciousness: awake and patient cooperative  Airway & Oxygen Therapy: Patient Spontanous Breathing and Patient connected to face mask oxygen  Post-op Assessment: Report given to PACU RN and Post -op Vital signs reviewed and stable  Post vital signs: Reviewed and stable  Complications: No apparent anesthesia complications

## 2012-12-21 NOTE — Progress Notes (Signed)
UR Chart Review Completed  

## 2012-12-21 NOTE — Consult Note (Signed)
Reason for Consult:RIGHTHIP FRACTURE  Referring Physician:GOSRANI Angela Angela [name] Angela [name] is an 72 y.o. female.  HPI:  I FELL OVER THE DOG GATE NO ASSISTIVE DEVICES  OCCASIONAL PREOP HIP PAIN D/W WALKING C/O PAIN  CANT WALK  SPASMS NON RADIATING  SEVERE WITH MOTION    Past Medical History  Diagnosis Date  . Clostridium difficile infection 2010    Past Surgical History  Procedure Laterality Date  . Skin biopsy      3x 2013  . Tonsillectomy      History reviewed. No pertinent family history.  Social History:  reports that she has never smoked. She does not have any smokeless tobacco history on file. She reports that she does not drink alcohol or use illicit drugs.  Allergies: No Known Allergies  Medications: I have reviewed the patient's current medications.  Results for orders placed during the hospital encounter of 12/20/12 (from the past 48 hour(s))  BASIC METABOLIC PANEL     Status: Abnormal   Collection Time    12/21/12 12:33 AM      Result Value Range   Sodium 139  135 - 145 mEq/L   Potassium 3.8  3.5 - 5.1 mEq/L   Chloride 103  96 - 112 mEq/L   CO2 25  19 - 32 mEq/L   Glucose, Bld 111 (*) 70 - 99 mg/dL   BUN 18  6 - 23 mg/dL   Creatinine, Ser 1.61  0.50 - 1.10 mg/dL   Calcium 9.1  8.4 - 09.6 mg/dL   GFR calc non Af Amer 84 (*) >90 mL/min   GFR calc Af Amer >90  >90 mL/min   Comment:            The eGFR has been calculated     using the CKD EPI equation.     This calculation has not been     validated in all clinical     situations.     eGFR's persistently     <90 mL/min signify     possible Chronic Kidney Disease.  CBC WITH DIFFERENTIAL     Status: None   Collection Time    12/21/12 12:33 AM      Result Value Range   WBC 9.0  4.0 - 10.5 K/uL   RBC 4.78  3.87 - 5.11 MIL/uL   Hemoglobin 14.3  12.0 - 15.0 g/dL   HCT 04.5  40.9 - 81.1 %   MCV 89.3  78.0 - 100.0 fL   MCH 29.9  26.0 - 34.0 pg   MCHC 33.5  30.0 - 36.0 g/dL   RDW 91.4  78.2 - 95.6 %   Platelets 229  150 - 400 K/uL   Neutrophils Relative 73  43 - 77 %   Neutro Abs 6.5  1.7 - 7.7 K/uL   Lymphocytes Relative 19  12 - 46 %   Lymphs Abs 1.8  0.7 - 4.0 K/uL   Monocytes Relative 7  3 - 12 %   Monocytes Absolute 0.6  0.1 - 1.0 K/uL   Eosinophils Relative 1  0 - 5 %   Eosinophils Absolute 0.1  0.0 - 0.7 K/uL   Basophils Relative 0  0 - 1 %   Basophils Absolute 0.0  0.0 - 0.1 K/uL  PROTIME-INR     Status: None   Collection Time    12/21/12 12:33 AM      Result Value Range   Prothrombin Time 12.3  11.6 - 15.2 seconds  INR 0.92  0.00 - 1.49  TYPE AND SCREEN     Status: None   Collection Time    12/21/12 12:33 AM      Result Value Range   ABO/RH(D) A POS     Antibody Screen NEG     Sample Expiration 12/24/2012    URINALYSIS, MICROSCOPIC ONLY     Status: Abnormal   Collection Time    12/21/12  1:21 AM      Result Value Range   Color, Urine YELLOW  YELLOW   APPearance HAZY (*) CLEAR   Specific Gravity, Urine 1.015  1.005 - 1.030   pH 6.0  5.0 - 8.0   Glucose, UA NEGATIVE  NEGATIVE mg/dL   Hgb urine dipstick TRACE (*) NEGATIVE   Bilirubin Urine NEGATIVE  NEGATIVE   Ketones, ur NEGATIVE  NEGATIVE mg/dL   Protein, ur NEGATIVE  NEGATIVE mg/dL   Urobilinogen, UA 0.2  0.0 - 1.0 mg/dL   Nitrite NEGATIVE  NEGATIVE   Leukocytes, UA TRACE (*) NEGATIVE   WBC, UA 21-50  <3 WBC/hpf   RBC / HPF 0-2  <3 RBC/hpf   Bacteria, UA MANY (*) RARE   Squamous Epithelial / LPF RARE  RARE    Dg Chest 1 View  12/21/2012  *RADIOLOGY REPORT*  Clinical Data: Intertrochanteric right femoral neck fracture. Preoperative respiratory evaluation.  CHEST - 1 VIEW  Comparison: None.  Findings: Cardiomediastinal silhouette unremarkable for the AP supine technique.  Suboptimal inspiration accounts for crowded bronchovascular markings diffusely.  Taking this into account, lungs clear.  IMPRESSION: Suboptimal inspiration.  No acute cardiopulmonary disease.   Original Report Authenticated By: Hulan Saas, M.D.    Dg Hip Complete Right  12/21/2012  *RADIOLOGY REPORT*  Clinical Data:  Larey Seat at home, injuring the right hip.  RIGHT HIP - COMPLETE 2+ VIEW  Comparison: None.  Findings: Intertrochanteric right femoral neck fracture without significant displacement.  Severe joint space narrowing in the right hip with protrusio deformity.  Included AP pelvis demonstrates a normal-appearing contralateral left hip.  Sacroiliac joints intact with mild degenerative changes. Symphysis pubis intact.  Bone mineral density well preserved. Degenerative changes involving the visualized lower lumbar spine.  IMPRESSION: Nondisplaced intertrochanteric right femoral neck fracture.  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   Original Report Authenticated By: Hulan Saas, M.D.     Review of Systems  Musculoskeletal: Positive for joint pain.       Hip arthritis walking difficult at times   All other systems reviewed and are negative.   Blood pressure 134/66, pulse 79, temperature 98.9 F (37.2 C), temperature source Oral, resp. rate 19, height 5' (1.524 m), weight 122 lb (55.339 kg), SpO2 96.00%. Physical Exam  Nursing note and vitals reviewed. Constitutional: She is oriented to person, place, and time. She appears well-developed and well-nourished. No distress.  HENT:  Head: Normocephalic.  Eyes: Right eye exhibits no discharge. Left eye exhibits no discharge. No scleral icterus.  Neck: Neck supple. No JVD present. No tracheal deviation present. No thyromegaly present.  Cardiovascular: Normal rate and intact distal pulses.   Respiratory: Effort normal. No respiratory distress.  GI: Soft. She exhibits no distension.  Musculoskeletal:       Left hip: She exhibits decreased range of motion, decreased strength, tenderness, bony tenderness and swelling. She exhibits no crepitus and no deformity.       Left knee: Normal.       Left ankle: Normal.  Upper extremity exam  The right  and left upper extremity:  Inspection and palpation revealed no abnormalities in the upper extremities.  Range of motion is full without contracture. Motor exam is normal with grade 5 strength. The joints are fully reduced without subluxation. There is no atrophy or tremor and muscle tone is normal.  All joints are stable.    Right lower  No deformity ROM NORMAL  STRENGTH NORMAL  STABILITY NORMAL   Lymphadenopathy:    She has no cervical adenopathy.  Neurological: She is alert and oriented to person, place, and time. She has normal reflexes.  Skin: Skin is warm and dry. She is not diaphoretic.  Multiples areas of skin breaks look like scratches     Assessment/Plan: CXR NORMAL   RIGHT HIP OA SEVERE   RIGHT HIP 2 PART INTERTROCHANTERIC FRACTURE   OTIF RIGHT HIP / GAMMA NAIL     Fuller Canada 12/21/2012, 1:48 PM

## 2012-12-21 NOTE — Progress Notes (Signed)
Pt seen examined and chart reviewed.   Assessment/Plan  1. Right hip fx: s/p mechanical fall. Pt seen by ortho last evening. Pt requesting Dr. Romeo Apple for surgery. Pain within manageable limits. Continue with pain med as needed. Continue NPO. Will plan to confirm consult with Dr. Romeo Apple if not seen by 11am. Anticipate surgery today.    Exam  Gen: awake alert well nourished Cardio: RRR No MGR No LEE PPP Resp: normal effort. BS somewhat distant but clear. No rhonchi no wheeze Abd: flat soft +BS non-tender to palpation  MS: right leg with slight external rotation. Currently elevated on pillow at knee. Right foot warm/dry. PPP. No joint swelling/erythema.     Lesle Chris. Black, NP Attending: Patient seen and examined. She is medically stable to have surgery today if schedule for Dr. Romeo Apple allows.

## 2012-12-21 NOTE — Clinical Social Work Psychosocial (Signed)
    Clinical Social Work Department BRIEF PSYCHOSOCIAL ASSESSMENT 12/21/2012  Patient:  Angela [name], Angela [name]     Account Number:  [id number]     Admit date:  12/20/2012  Clinical Social Worker:  Santa Genera, CLINICAL SOCIAL WORKER  Date/Time:  12/21/2012 12:00 N  Referred by:  Physician  Date Referred:  12/21/2012 Referred for  Other - See comment   Other Referral:   Hip fracture   Interview type:  Patient Other interview type:    PSYCHOSOCIAL DATA Living Status:  FAMILY Admitted from facility:   Level of care:   Primary support name:  Pandora Mccrackin Primary support relationship to patient:  SPOUSE Degree of support available:   Significant.  Adult son also available to assist    CURRENT CONCERNS Current Concerns  Post-Acute Placement   Other Concerns:    SOCIAL WORK ASSESSMENT / PLAN CSW met w patient at bedside, patient alert and oriented x4.  Patient fell while carrying pet over gate inside house, fell last night, transported to [facility] via EMS. Scheduled for hip fracture surgery today w Dr Romeo Apple. Patient lives w husband in family home, also has adult son who is available to assist w care if needed.  Patient is retired Barrister's clerk at [employer].    CSW explained possibility of placement for rehab post surgery, pending recommendation of PT evaluation.  Patient verbalized understanding of process.  CSW will await eval post surgery before proceeding w placement process.   Assessment/plan status:  Psychosocial Support/Ongoing Assessment of Needs Other assessment/ plan:   Information/referral to community resources:   None needed at this time, will give SNF list if PT recommends placement    PATIENT'S/FAMILY'S RESPONSE TO PLAN OF CARE: Patient appreciative of support   Santa Genera, LCSW Clinical Social Worker [phone])

## 2012-12-21 NOTE — Anesthesia Preprocedure Evaluation (Signed)
Anesthesia Evaluation  Patient identified by MRN, date of birth, ID band Patient awake    Reviewed: Allergy & Precautions, H&P , NPO status , Patient's Chart, lab work & pertinent test results  Airway Mallampati: I TM Distance: >3 FB     Dental  (+) Teeth Intact   Pulmonary neg pulmonary ROS,  breath sounds clear to auscultation        Cardiovascular negative cardio ROS  Rhythm:Regular Rate:Normal     Neuro/Psych negative neurological ROS     GI/Hepatic negative GI ROS,   Endo/Other    Renal/GU      Musculoskeletal   Abdominal   Peds  Hematology   Anesthesia Other Findings   Reproductive/Obstetrics                           Anesthesia Physical Anesthesia Plan  ASA: II  Anesthesia Plan: Spinal   Post-op Pain Management:    Induction:   Airway Management Planned: Nasal Cannula  Additional Equipment:   Intra-op Plan:   Post-operative Plan:   Informed Consent: I have reviewed the patients History and Physical, chart, labs and discussed the procedure including the risks, benefits and alternatives for the proposed anesthesia with the patient or authorized representative who has indicated his/her understanding and acceptance.     Plan Discussed with:   Anesthesia Plan Comments:         Anesthesia Quick Evaluation

## 2012-12-21 NOTE — Anesthesia Procedure Notes (Signed)
Spinal  Start time: 12/21/2012 2:22 PM Staffing CRNA/Resident: Joycelyn Man J Preanesthetic Checklist Completed: patient identified, site marked, surgical consent, pre-op evaluation, timeout performed, IV checked, risks and benefits discussed and monitors and equipment checked Spinal Block Patient position: right lateral decubitus Prep: Betadine Patient monitoring: continuous pulse ox, blood pressure, cardiac monitor and heart rate Approach: midline Location: L3-4 Injection technique: single-shot Needle Needle type: Spinocan  Needle gauge: 22 G Assessment Sensory level: T8 Additional Notes CSF brisk and clear       [id number]   09/2013

## 2012-12-22 DIAGNOSIS — D62 Acute posthemorrhagic anemia: Secondary | ICD-10-CM | POA: Diagnosis not present

## 2012-12-22 LAB — CBC
Hemoglobin: 11.5 g/dL — ABNORMAL LOW (ref 12.0–15.0)
MCH: 30.4 pg (ref 26.0–34.0)
Platelets: 170 10*3/uL (ref 150–400)
RBC: 3.78 MIL/uL — ABNORMAL LOW (ref 3.87–5.11)
WBC: 10.4 10*3/uL (ref 4.0–10.5)

## 2012-12-22 LAB — URINE CULTURE: Colony Count: >=100000

## 2012-12-22 LAB — BASIC METABOLIC PANEL
CO2: 28 mEq/L (ref 19–32)
Chloride: 102 mEq/L (ref 96–112)
Glucose, Bld: 127 mg/dL — ABNORMAL HIGH (ref 70–99)
Sodium: 136 mEq/L (ref 135–145)

## 2012-12-22 MED ORDER — CIPROFLOXACIN HCL 250 MG PO TABS
500.0000 mg | ORAL_TABLET | Freq: Two times a day (BID) | ORAL | Status: DC
  Administered 2012-12-22 – 2012-12-24 (×4): 500 mg via ORAL
  Filled 2012-12-22: qty 2
  Filled 2012-12-22 (×2): qty 1
  Filled 2012-12-22 (×2): qty 2

## 2012-12-22 MED ORDER — HYDROCODONE-ACETAMINOPHEN 7.5-325 MG PO TABS
1.0000 | ORAL_TABLET | ORAL | Status: DC
  Administered 2012-12-23 – 2012-12-24 (×9): 1 via ORAL
  Filled 2012-12-22 (×9): qty 1

## 2012-12-22 NOTE — Progress Notes (Signed)
Subjective: 1 Day Post-Op Procedure(s) (LRB): INTRAMEDULLARY (IM) NAIL INTERTROCHANTRIC (Right)  Objective: Vital signs in last 24 hours: Temp:  [98.7 F (37.1 C)-99.1 F (37.3 C)] 99.1 F (37.3 C) (03/19 1500) Pulse Rate:  [79-89] 84 (03/19 1500) Resp:  [16-20] 16 (03/19 1500) BP: (104-153)/(52-71) 153/71 mmHg (03/19 1500) SpO2:  [93 %-95 %] 94 % (03/19 1500)  Intake/Output from previous day: 03/18 0701 - 03/19 0700 In: 2484.2 [P.O.:240; I.V.:2244.2] Out: 2675 [Urine:2600; Blood:75] Intake/Output this shift:     Recent Labs  12/21/12 0033 12/22/12 0532  HGB 14.3 11.5*    Recent Labs  12/21/12 0033 12/22/12 0532  WBC 9.0 10.4  RBC 4.78 3.78*  HCT 42.7 34.3*  PLT 229 170    Recent Labs  12/21/12 0033 12/22/12 0532  NA 139 136  K 3.8 4.0  CL 103 102  CO2 25 28  BUN 18 9  CREATININE 0.72 0.62  GLUCOSE 111* 127*  CALCIUM 9.1 8.5    Recent Labs  12/21/12 0033  INR 0.92    Assessment/Plan: 1 Day Post-Op Procedure(s) (LRB): INTRAMEDULLARY (IM) NAIL INTERTROCHANTRIC (Right) Chart review: good 1st day continue therapy   Adjust pain meds to avoid spikes in intensity will monitor for over sedation   Fuller Canada 12/22/2012, 9:41 PM

## 2012-12-22 NOTE — Care Management Note (Signed)
    Page 1 of 1   12/24/2012     11:54:57 AM   CARE MANAGEMENT NOTE 12/24/2012  Patient:  Angela [name], Angela [name]   Account Number:  [id number]  Date Initiated:  12/22/2012  Documentation initiated by:  Rosemary Holms  Subjective/Objective Assessment:   Pt admitted from home where she lives with her spouse. Spoke briefly with pt and spouse at bedside. Discussed HH PT and equipment needs. Will follow.     Action/Plan:   Anticipated DC Date:  12/24/2012   Anticipated DC Plan:  SKILLED NURSING FACILITY  In-house referral  Clinical Social Worker      DC Planning Services  CM consult      Choice offered to / List presented to:             Status of service:  Completed, signed off Medicare Important Message given?  YES (If response is "NO", the following Medicare IM given date fields will be blank) Date Medicare IM given:  12/24/2012 Date Additional Medicare IM given:    Discharge Disposition:  SKILLED NURSING FACILITY  Per UR Regulation:    If discussed at Long Length of Stay Meetings, dates discussed:    Comments:  12/24/12 1030 Jrue Jarriel RN BSN CM pt going to [employer]  12/22/12 Gessica Jawad Leanord Hawking RN BSN CM

## 2012-12-22 NOTE — Progress Notes (Signed)
[facility] PROGRESS NOTE  Angela [name]:[id number] DOB: 1940/10/12 DOA: 12/20/2012 PCP: Dwana Melena, MD  Assessment/Plan:  Right hip fx: s/p mechanical fall. Post op day #1. Afebrile, VSS.  Pain within manageable limits. Continue with pain med and robaxin as needed. Up with PT likely today. May benefit rehab. SW following.   Anemia: likely related to above. Expect it to resolve. Will monitor closely   Code Status: full Family Communication: pt at bedside Disposition Plan: home when ready    Consultants:  Dr. Romeo Apple [facility]  Procedures:  OTIF right hip 12/21/12  Antibiotics:  Ancef 12/21/12  HPI/Subjective: Alert reports pain fairly tolerable. NAD  Objective: Filed Vitals:   12/22/12 0000 12/22/12 0147 12/22/12 0356 12/22/12 0420  BP:  104/52  116/53  Pulse:  89  79  Temp:  98.8 F (37.1 C)  98.7 F (37.1 C)  TempSrc:  Oral  Oral  Resp: 16 20 18 16   Height:      Weight:      SpO2: 95% 93% 94% 94%    Intake/Output Summary (Last 24 hours) at 12/22/12 0841 Last data filed at 12/22/12 4401  Gross per 24 hour  Intake 3837.5 ml  Output   2675 ml  Net 1162.5 ml   Filed Weights   12/20/12 2310 12/21/12 1308  Weight: 55.339 kg (122 lb) 55.339 kg (122 lb)    Exam:   General:  Awake alert NAD  Cardiovascular: RRR No MGR No LEE  Respiratory: normal effort BSCTAB No wheeze  Abdomen: flat soft +BS non-tender to palpation  Musculoskeletal: dressing right hip dry and intact.    Data Reviewed: Basic Metabolic Panel:  Recent Labs Lab 12/21/12 0033 12/22/12 0532  NA 139 136  K 3.8 4.0  CL 103 102  CO2 25 28  GLUCOSE 111* 127*  BUN 18 9  CREATININE 0.72 0.62  CALCIUM 9.1 8.5       CBC:  Recent Labs Lab 12/21/12 0033 12/22/12 0532  WBC 9.0 10.4  NEUTROABS 6.5  --   HGB 14.3 11.5*  HCT 42.7 34.3*  MCV 89.3 90.7  PLT 229 170      Recent Results (from the past 240 hour(s))  URINE CULTURE     Status: None   Collection  Time    12/21/12  1:21 AM      Result Value Range Status   Specimen Description URINE, CATHETERIZED   Final   Special Requests NONE   Final   Culture  Setup Time 12/21/2012 02:05   Final   Colony Count >=100,000 COLONIES/ML   Final   Culture ESCHERICHIA COLI   Final   Report Status PENDING   Incomplete  SURGICAL PCR SCREEN     Status: None   Collection Time    12/21/12 10:34 AM      Result Value Range Status   MRSA, PCR NEGATIVE  NEGATIVE Final   Staphylococcus aureus NEGATIVE  NEGATIVE Final   Comment:            The Xpert SA Assay (FDA     approved for NASAL specimens     in patients over 70 years of age),     is one component of     a comprehensive surveillance     program.  Test performance has     been validated by [employer] for patients greater     than or equal to 52 year old.     It is  not intended     to diagnose infection nor to     guide or monitor treatment.     Studies: Dg Chest 1 View  12/21/2012  *RADIOLOGY REPORT*  Clinical Data: Intertrochanteric right femoral neck fracture. Preoperative respiratory evaluation.  CHEST - 1 VIEW  Comparison: None.  Findings: Cardiomediastinal silhouette unremarkable for the AP supine technique.  Suboptimal inspiration accounts for crowded bronchovascular markings diffusely.  Taking this into account, lungs clear.  IMPRESSION: Suboptimal inspiration.  No acute cardiopulmonary disease.   Original Report Authenticated By: Hulan Saas, M.D.    Dg Hip Complete Right  12/21/2012  *RADIOLOGY REPORT*  Clinical Data:  Larey Seat at home, injuring the right hip.  RIGHT HIP - COMPLETE 2+ VIEW  Comparison: None.  Findings: Intertrochanteric right femoral neck fracture without significant displacement.  Severe joint space narrowing in the right hip with protrusio deformity.  Included AP pelvis demonstrates a normal-appearing contralateral left hip.  Sacroiliac joints intact with mild degenerative changes. Symphysis pubis intact.  Bone mineral  density well preserved. Degenerative changes involving the visualized lower lumbar spine.  IMPRESSION: Nondisplaced intertrochanteric right femoral neck fracture.  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   Original Report Authenticated By: Hulan Saas, M.D.    Dg Hip Operative Right  12/21/2012  *RADIOLOGY REPORT*  Clinical Data: Right hip gamma nail placement.  DG OPERATIVE RIGHT HIP  Comparison: Radiograph 12/20/2012.  Findings: Series of 10 intraoperative fluoroscopic spot films demonstrate placement of a gamma nail with short femoral nail.  IMPRESSION: ORIF of right hip fracture with short gamma nail   Original Report Authenticated By: Andreas Newport, M.D.     Scheduled Meds: . docusate sodium  100 mg Oral BID  . enoxaparin (LOVENOX) injection  40 mg Subcutaneous Q24H  . HYDROcodone-acetaminophen  1 tablet Oral Q4H  . senna  1 tablet Oral BID   Continuous Infusions: . lactated ringers 100 mL/hr at 12/22/12 0541    Active Problems:   Closed hip fracture   Intertrochanteric fracture of right hip   Acute blood loss anemia    Time spent: 30 minutes    [facility] M  [facility]  If 7PM-7AM, please contact night-coverage at [email or web address], password [facility] 12/22/2012, 8:41 AM  LOS: 2 days   Attending: Patient seen and examined. Above note reviewed. Patient is doing well postoperatively. No dyspnea. Lung fields are clear.  Plan: 1. Reduce IV fluids. Encourage oral intake. 2. Physical therapy.

## 2012-12-22 NOTE — Evaluation (Signed)
Physical Therapy Evaluation Patient Details Name: Angela [name] MRN: [id number] DOB: [birth year] Today's Date: 12/22/2012 Time: 4098-1191 PT Time Calculation (min): 48 min  PT Assessment / Plan / Recommendation Clinical Impression  Pt is a normally 72 yo female who fell at home yesterday fracturing her hip.  Pt did well with therapy and anticipate that the patient will be able to go home with [facility] therapy.     PT Assessment    Pt with decreased strength and difficulty walking.  PT will need skilled care while in hospital to improve safety of ambulation and mobility/   Follow Up Recommendations  Home health PT    Does the patient have the potential to tolerate intense rehabilitation    N/A  Barriers to Discharge None      Equipment Recommendations  Rolling walker with 5" wheels    Recommendations for Other Services OT consult   Frequency Min 6X/week    Precautions / Restrictions Precautions Precautions: Fall Restrictions Weight Bearing Restrictions: No RLE Weight Bearing: Non weight bearing   Pertinent Vitals/Pain 6/10      Mobility  Bed Mobility Bed Mobility: Supine to Sit Supine to Sit: 3: Mod assist Transfers Transfers: Sit to Stand Sit to Stand: 4: Min guard Ambulation/Gait Ambulation/Gait Assistance: 4: Min guard Ambulation Distance (Feet): 8 Feet Assistive device: Standard walker Gait Pattern: Step-to pattern;Decreased step length - left Gait velocity: slow Stairs: No    Exercises General Exercises - Lower Extremity Ankle Circles/Pumps: AROM;Both;10 reps Quad Sets: AROM;Both;10 reps Gluteal Sets: AROM;Both;10 reps Heel Slides: AAROM;Right;10 reps   PT Diagnosis: Difficulty walking;Generalized weakness;Acute pain  PT Problem List: Decreased strength;Decreased activity tolerance;Decreased balance;Decreased mobility PT Treatment Interventions: Gait training;Therapeutic activities;Therapeutic exercise   PT Goals Acute Rehab PT Goals PT Goal  Formulation: With patient Potential to Achieve Goals: Good Pt will go Sit to Supine/Side: with modified independence Pt will go Sit to Stand: with modified independence Pt will Ambulate: 51 - 150 feet;with modified independence PT Goal: Ambulate - Progress: Goal set today  Visit Information  Last PT Received On: 12/22/12    Subjective Data      Prior Functioning  Home Living Lives With: Spouse Available Help at Discharge: Family Type of Home: House Home Access: Level entry Home Layout: Able to live on main level with bedroom/bathroom Bathroom Shower/Tub: Engineer, manufacturing systems: Standard Home Adaptive Equipment: None Prior Function Level of Independence: Independent Able to Take Stairs?: Yes Driving: No Vocation: Retired Musician: No difficulties Dominant Hand: Right    Cognition  Cognition Overall Cognitive Status: Appears within functional limits for tasks assessed/performed Arousal/Alertness: Awake/alert Behavior During Session: [facility] for tasks performed    Extremity/Trunk Assessment Right Lower Extremity Assessment RLE ROM/Strength/Tone: Deficits;Due to pain RLE ROM/Strength/Tone Deficits: due to pain and strength Left Lower Extremity Assessment LLE ROM/Strength/Tone: Within functional levels   Balance    End of Session PT - End of Session Equipment Utilized During Treatment: Gait belt Activity Tolerance: Patient tolerated treatment well Patient left: in chair;with call bell/phone within reach  GP     RUSSELL,CINDY 12/22/2012, 9:34 AM

## 2012-12-22 NOTE — Anesthesia Postprocedure Evaluation (Signed)
  Anesthesia Post-op Note  Patient: Angela [name]  Procedure(s) Performed: Procedure(s): INTRAMEDULLARY (IM) NAIL INTERTROCHANTRIC (Right)  Patient Location: room 337  Anesthesia Type:Spinal  Level of Consciousness: awake, alert , oriented and patient cooperative  Airway and Oxygen Therapy: Patient Spontanous Breathing and Patient connected to nasal cannula oxygen  Post-op Pain: 5 /10, moderate  Post-op Assessment: Post-op Vital signs reviewed, Patient's Cardiovascular Status Stable, Respiratory Function Stable, Patent Airway, No signs of Nausea or vomiting, Adequate PO intake and Pain level controlled  Post-op Vital Signs: Reviewed and stable  Complications: No apparent anesthesia complications

## 2012-12-22 NOTE — Clinical Social Work Note (Signed)
CSW reviewed PT recommendation for patient to return home at discharge w [facility] PT, will not need SNF placement for rehab.  As patient does not have SW need at this time, CSW will sign off but remain available to be reconsulted if further SW needs arise.  RN CM aware of HH recommendation.  Santa Genera, LCSW Clinical Social Worker [phone])

## 2012-12-23 LAB — CBC
HCT: 32.9 % — ABNORMAL LOW (ref 36.0–46.0)
Hemoglobin: 10.9 g/dL — ABNORMAL LOW (ref 12.0–15.0)
RBC: 3.62 MIL/uL — ABNORMAL LOW (ref 3.87–5.11)
WBC: 9.3 10*3/uL (ref 4.0–10.5)

## 2012-12-23 LAB — BASIC METABOLIC PANEL
BUN: 7 mg/dL (ref 6–23)
CO2: 30 mEq/L (ref 19–32)
Chloride: 106 mEq/L (ref 96–112)
Glucose, Bld: 103 mg/dL — ABNORMAL HIGH (ref 70–99)
Potassium: 3.9 mEq/L (ref 3.5–5.1)
Sodium: 142 mEq/L (ref 135–145)

## 2012-12-23 MED ORDER — ENOXAPARIN SODIUM 40 MG/0.4ML ~~LOC~~ SOLN: 40.0000 mg | SUBCUTANEOUS | Status: DC

## 2012-12-23 MED ORDER — BISACODYL 5 MG PO TBEC: 5.0000 mg | DELAYED_RELEASE_TABLET | Freq: Every day | ORAL | Status: DC | PRN

## 2012-12-23 MED ORDER — HYDROCODONE-ACETAMINOPHEN 7.5-325 MG PO TABS: 1.0000 | ORAL_TABLET | ORAL | Status: DC

## 2012-12-23 MED ORDER — HYDROCODONE-ACETAMINOPHEN 7.5-325 MG PO TABS: 1.0000 | ORAL_TABLET | Freq: Four times a day (QID) | ORAL | Status: DC | PRN

## 2012-12-23 NOTE — Progress Notes (Signed)
Physical Therapy Treatment Patient Details Name: Angela [name] MRN: [id number] DOB: October 28, 1940 Today's Date: 12/23/2012 Time: 2130-8657 PT Time Calculation (min): 35 min  PT Assessment / Plan / Recommendation Comments on Treatment Session  Pt is pleasant and cooperative. Though she appears somewhat fatigued she is willing to participate in therapy. Pt tolerates increased distance with gait training this session. Spoke with Myrlene Broker, PT who recommends SNF for discharge now rather than discharge to home.    Follow Up Recommendations  SNF                    Plan Discharge plan needs to be updated           Mobility  Bed Mobility Bed Mobility: Scooting to HOB Supine to Sit: 4: Min assist Scooting to [facility]: 4: Min guard Transfers Transfers: Sit to Stand;Stand to Sit Sit to Stand: 4: Min guard;With upper extremity assist Stand to Sit: 5: Supervision;With upper extremity assist Ambulation/Gait Ambulation/Gait Assistance: 4: Min guard Ambulation Distance (Feet): 90 Feet Assistive device: Rolling walker Gait Pattern: Decreased step length - left;Decreased stance time - right;Decreased stride length Gait velocity: slow Stairs: No Wheelchair Mobility Wheelchair Mobility: No    Exercises Total Joint Exercises Ankle Circles/Pumps: AROM;Both;10 reps Quad Sets: AROM;Both;10 reps Gluteal Sets: AROM;Both;10 reps Short Arc Quad: AROM;10 reps;Both Heel Slides: AROM;Both;10 reps General Exercises - Lower Extremity Ankle Circles/Pumps: AROM;Both;10 reps Quad Sets: AROM;Both;10 reps Gluteal Sets: AROM;Both;10 reps Heel Slides: AAROM;Right;10 reps    PT Goals Acute Rehab PT Goals PT Goal: Sit to Supine/Side - Progress: Progressing toward goal PT Goal: Sit to Stand - Progress: Progressing toward goal PT Goal: Ambulate - Progress: Progressing toward goal  Visit Information  Last PT Received On: 12/23/12    Subjective Data  Subjective: Pt states that she just got back  into the bad from going to the bathroom with nursing.         End of Session PT - End of Session Equipment Utilized During Treatment: Gait belt Activity Tolerance: Patient tolerated treatment well Patient left: in bed;with call bell/phone within reach   Seth Bake, PTA 12/23/2012, 1:58 PM

## 2012-12-23 NOTE — Discharge Summary (Addendum)
Physician Discharge Summary  Patient ID: Angela [name] MRN: [id number] DOB/AGE: 02-08-1941 72 y.o.  Admit date: 12/20/2012 Discharge date: 12/24/2012  Admission Diagnoses: The primary encounter diagnosis was Intertrochanteric fracture of right hip, closed, initial encounter. A diagnosis of Hip fracture, right, closed, initial encounter was also pertinent to this visit.   Discharge Diagnoses: same  Active Problems:   Closed hip fracture   Intertrochanteric fracture of right hip   Acute blood loss anemia   Discharged Condition: good  Hospital Course: The patient was admitted on March 17 after falling in her home. She underwent open treatment internal fixation with a gamma nail. She tolerated that well was able to have good with physical therapy but was unsteady and concerned about getting in and out of bed with her home situation. She is therefore referred to skilled nursing facility.   Significant Diagnostic Studies:  CBC    Component Value Date/Time   WBC 9.4 12/24/2012 0539   RBC 3.34* 12/24/2012 0539   HGB 10.3* 12/24/2012 0539   HCT 30.2* 12/24/2012 0539   PLT 151 12/24/2012 0539   MCV 90.4 12/24/2012 0539   MCH 30.8 12/24/2012 0539   MCHC 34.1 12/24/2012 0539   RDW 13.8 12/24/2012 0539   LYMPHSABS 1.8 12/21/2012 0033   MONOABS 0.6 12/21/2012 0033   EOSABS 0.1 12/21/2012 0033   BASOSABS 0.0 12/21/2012 0033    BMET    Component Value Date/Time   NA 142 12/23/2012 0549   K 3.9 12/23/2012 0549   CL 106 12/23/2012 0549   CO2 30 12/23/2012 0549   GLUCOSE 103* 12/23/2012 0549   BUN 7 12/23/2012 0549   CREATININE 0.64 12/23/2012 0549   CALCIUM 8.7 12/23/2012 0549   GFRNONAA 88* 12/23/2012 0549   GFRAA >90 12/23/2012 0549      Treatments:   Discharge Exam: Blood pressure 111/67, pulse 85, temperature 98.3 F (36.8 C), temperature source Oral, resp. rate 20, height 5' (1.524 m), weight 122 lb (55.339 kg), SpO2 93.00%. General appearance: alert, cooperative and appears stated  age Incision/Wound: clean   Disposition: Final discharge disposition not confirmed      Discharge Orders   Future Orders Complete By Expires     Call MD / Call 911  As directed     Comments:      If you experience chest pain or shortness of breath, CALL 911 and be transported to the hospital emergency room.  If you develope a fever above 101 F, pus (white drainage) or increased drainage or redness at the wound, or calf pain, call your surgeon's office.    Constipation Prevention  As directed     Comments:      Drink plenty of fluids.  Prune juice may be helpful.  You may use a stool softener, such as Colace (over the counter) 100 mg twice a day.  Use MiraLax (over the counter) for constipation as needed.    Diet - low sodium heart healthy  As directed     Discharge instructions  As directed     Comments:      HIP FRACTURE INSTRUCTIONS: TAKE STAPLES OUT POD 12 APPLY STERI STRIPS WEIGHT BEARING AS TOLERATED WITH A WALKER X 6 WEEKS  LOVENOX X 30 DAYS  HIP PRECAUTIONS: NO    Increase activity slowly as tolerated  As directed         Medication List    STOP taking these medications       CRANBERRY PO  TAKE these medications       aspirin 81 MG tablet  Take 81 mg by mouth daily.     bisacodyl 5 MG EC tablet  Commonly known as:  DULCOLAX  Take 1 tablet (5 mg total) by mouth daily as needed.     enoxaparin 30 MG/0.3ML injection  Commonly known as:  LOVENOX  Inject 0.3 mLs (30 mg total) into the skin daily.     HYDROcodone-acetaminophen 7.5-325 MG per tablet  Commonly known as:  NORCO  Take 1 tablet by mouth every 4 (four) hours.       Follow-up Information   Follow up with Fuller Canada, MD. Schedule an appointment as soon as possible for a visit in 2 weeks.   Contact information:   [address], [address] [place] [address] [phone]       Signed: Fuller Canada 12/23/2012, 5:33 PM

## 2012-12-23 NOTE — Progress Notes (Signed)
[facility] PROGRESS NOTE  Angela [name] JYN:[id number] DOB: May 21, 1941 DOA: 12/20/2012 PCP: Dwana Melena, MD  Assessment/Plan: Right hip fx: s/p mechanical fall. Post op day #2. Remains afebrile, VSS. Reports less pain today.  Continue with pain med and robaxin as needed.  Up with PT. Likely SNF  Anemia: likely related to above. Expect it to resolve. Stable. Continue to monitor.      Code Status: full Family Communication: pt at bedside Disposition Plan: SNF when ready   Consultants:  [facility]  Procedures:  OTIF tight hip 12/21/12  Antibiotics:  Ancef 12/21/12  HPI/Subjective: Awake alert smiling. Reports less pain. No other complaints  Objective: Filed Vitals:   12/23/12 0000 12/23/12 0219 12/23/12 0342 12/23/12 0527  BP:  126/70  121/58  Pulse:  84  80  Temp:  98.4 F (36.9 C)  98.2 F (36.8 C)  TempSrc:  Oral  Oral  Resp: 18 18 18 20   Height:      Weight:      SpO2: 96% 93% 93% 95%    Intake/Output Summary (Last 24 hours) at 12/23/12 1141 Last data filed at 12/23/12 0419  Gross per 24 hour  Intake 1574.83 ml  Output   2000 ml  Net -425.17 ml   Filed Weights   12/20/12 2310 12/21/12 1308  Weight: 55.339 kg (122 lb) 55.339 kg (122 lb)    Exam:   General:  Alert smiling, NAD  Cardiovascular: RRR No murmur, gallup,rub. No LEE PPP bilaterally  Respiratory: normal effort. BS clear bilaterally no wheeze/rhonchi  Abdomen: soft flat +BS non-tender to palpation  Musculoskeletal: dressing right hip dry/intact.    Data Reviewed: Basic Metabolic Panel:  Recent Labs Lab 12/21/12 0033 12/22/12 0532 12/23/12 0549  NA 139 136 142  K 3.8 4.0 3.9  CL 103 102 106  CO2 25 28 30   GLUCOSE 111* 127* 103*  BUN 18 9 7   CREATININE 0.72 0.62 0.64  CALCIUM 9.1 8.5 8.7   Liver Function Tests: No results found for this basename: AST, ALT, ALKPHOS, BILITOT, PROT, ALBUMIN,  in the last 168 hours No results found for this basename:  LIPASE, AMYLASE,  in the last 168 hours No results found for this basename: AMMONIA,  in the last 168 hours CBC:  Recent Labs Lab 12/21/12 0033 12/22/12 0532 12/23/12 0549  WBC 9.0 10.4 9.3  NEUTROABS 6.5  --   --   HGB 14.3 11.5* 10.9*  HCT 42.7 34.3* 32.9*  MCV 89.3 90.7 90.9  PLT 229 170 158   Cardiac Enzymes: No results found for this basename: CKTOTAL, CKMB, CKMBINDEX, TROPONINI,  in the last 168 hours BNP (last 3 results) No results found for this basename: PROBNP,  in the last 8760 hours CBG: No results found for this basename: GLUCAP,  in the last 168 hours  Recent Results (from the past 240 hour(s))  URINE CULTURE     Status: None   Collection Time    12/21/12  1:21 AM      Result Value Range Status   Specimen Description URINE, CATHETERIZED   Final   Special Requests NONE   Final   Culture  Setup Time 12/21/2012 02:05   Final   Colony Count >=100,000 COLONIES/ML   Final   Culture ESCHERICHIA COLI   Final   Report Status 12/22/2012 FINAL   Final   Organism ID, Bacteria ESCHERICHIA COLI   Final  SURGICAL PCR SCREEN     Status: None   Collection Time  01-02-13 10:34 AM      Result Value Range Status   MRSA, PCR NEGATIVE  NEGATIVE Final   Staphylococcus aureus NEGATIVE  NEGATIVE Final   Comment:            The Xpert SA Assay (FDA     approved for NASAL specimens     in patients over 9 years of age),     is one component of     a comprehensive surveillance     program.  Test performance has     been validated by [employer] for patients greater     than or equal to 51 year old.     It is not intended     to diagnose infection nor to     guide or monitor treatment.     Studies: Dg Hip Operative Right  01/02/2013  *RADIOLOGY REPORT*  Clinical Data: Right hip gamma nail placement.  DG OPERATIVE RIGHT HIP  Comparison: Radiograph 12/20/2012.  Findings: Series of 10 intraoperative fluoroscopic spot films demonstrate placement of a gamma nail with short  femoral nail.  IMPRESSION: ORIF of right hip fracture with short gamma nail   Original Report Authenticated By: Andreas Newport, M.D.     Scheduled Meds: . ciprofloxacin  500 mg Oral BID  . docusate sodium  100 mg Oral BID  . enoxaparin (LOVENOX) injection  40 mg Subcutaneous Q24H  . HYDROcodone-acetaminophen  1 tablet Oral Q4H  . senna  1 tablet Oral BID   Continuous Infusions: . lactated ringers 10 mL/hr at 12/23/12 0045    Active Problems:   Closed hip fracture   Intertrochanteric fracture of right hip   Acute blood loss anemia    Time spent: 30 minutes    [facility] M  [facility]  If 7PM-7AM, please contact night-coverage at [email or web address], password [facility] 12/23/2012, 11:41 AM  LOS: 3 days   Attending: Patient seen and examined, above note reviewed. She is progressing very nicely. She will benefit from skilled nursing facility placement/rehabilitation initially prior to discharge home eventually.

## 2012-12-23 NOTE — Clinical Social Work Placement (Signed)
Clinical Social Work Department CLINICAL SOCIAL WORK PLACEMENT NOTE 12/23/2012  Patient:  Angela [name], Angela [name]  Account Number:  [id number] Admit date:  12/20/2012  Clinical Social Worker:  Derenda Fennel, LCSW  Date/time:  12/23/2012 02:43 PM  Clinical Social Work is seeking post-discharge placement for this patient at the following level of care:   SKILLED NURSING   (*CSW will update this form in Epic as items are completed)   12/23/2012  Patient/family provided with [facility] Department of Clinical Social Work's list of facilities offering this level of care within the geographic area requested by the patient (or if unable, by the patient's family).  12/23/2012  Patient/family informed of their freedom to choose among providers that offer the needed level of care, that participate in Medicare, Medicaid or managed care program needed by the patient, have an available bed and are willing to accept the patient.  12/23/2012  Patient/family informed of [facility]' ownership interest in [facility], as well as of the fact that they are under no obligation to receive care at this facility.  PASARR submitted to EDS on 12/23/2012 PASARR number received from EDS on 12/23/2012  FL2 transmitted to all facilities in geographic area requested by pt/family on  12/23/2012 FL2 transmitted to all facilities within larger geographic area on   Patient informed that his/her managed care company has contracts with or will negotiate with  certain facilities, including the following:     Patient/family informed of bed offers received:   Patient chooses bed at  Physician recommends and patient chooses bed at    Patient to be transferred to  on   Patient to be transferred to facility by   The following physician request were entered in Epic:   Additional Comments:  Derenda Fennel, LCSW [phone]

## 2012-12-23 NOTE — Progress Notes (Signed)
Physical Therapy Treatment Patient Details Name: Angela [name] MRN: [id number] DOB: 1940/12/12 Today's Date: 12/23/2012 Time: 5621-3086 PT Time Calculation (min): 35 min   PT Assessment / Plan / Recommendation Comments on Treatment Session  Min cueing required for safe hand placement with sit to stands and gait training.  Gait training with multimodal cueing for heel to toe, increase stride length L LE, increased stance phase RLE for improved gait mechanics.  Pt limited by fatigue at end of session.  Pt left in bed per request due to discomfort sitting in chair yesterday with ice on hip for pain relief,, pt encouaged to sit by EOB to prevent risk of secondary issues like pneumonia    Follow Up Recommendations  SNF     Does the patient have the potential to tolerate intense rehabilitation     Barriers to Discharge        Equipment Recommendations       Recommendations for Other Services    Frequency     Plan Discharge plan needs to be updated    Precautions / Restrictions Precautions Precautions: Fall Restrictions Weight Bearing Restrictions: Yes RLE Weight Bearing: Weight bearing as tolerated   Pertinent Vitals/Pain     Mobility  Bed Mobility Bed Mobility: Scooting to HOB Supine to Sit: 4: Min assist (min assist with RLE) Scooting to HOB: 4: Min guard Transfers Transfers: Sit to Stand;Stand to Sit Sit to Stand: 4: Min guard;With upper extremity assist (cueing for hand placement ) Stand to Sit: 5: Supervision;With upper extremity assist Ambulation/Gait Ambulation/Gait Assistance: 4: Min guard Ambulation Distance (Feet): 24 Feet Assistive device: Rolling walker Gait Pattern: Decreased step length - left;Decreased stance time - right;Decreased stride length Gait velocity: slow Stairs: No Wheelchair Mobility Wheelchair Mobility: No    Exercises Total Joint Exercises Ankle Circles/Pumps: AROM;Both;10 reps Quad Sets: AROM;Both;10 reps Gluteal Sets: AROM;Both;10  reps Short Arc Quad: AROM;10 reps;Both Heel Slides: AROM;Both;10 reps   PT Diagnosis:    PT Problem List:   PT Treatment Interventions:     PT Goals Acute Rehab PT Goals PT Goal: Sit to Supine/Side - Progress: Progressing toward goal PT Goal: Sit to Stand - Progress: Progressing toward goal PT Goal: Ambulate - Progress: Progressing toward goal  Visit Information  Last PT Received On: 12/23/12    Subjective Data  Subjective: Pt stated she feels better today, reported real discomfort sitting in chair yesterday following 15-20 minutes of sitting.  Pt has been thinking and feels that going to rehab rather than home health will be more beneficial due to family support that she does have.  Pain scale 5-6/10 pre medicated 30 minutes prior therapy.   Cognition  Cognition Overall Cognitive Status: Appears within functional limits for tasks assessed/performed Arousal/Alertness: Awake/alert Orientation Level: Appears intact for tasks assessed Behavior During Session: [facility] for tasks performed    Balance     End of Session PT - End of Session Equipment Utilized During Treatment: Gait belt Activity Tolerance: Patient tolerated treatment well Patient left: in bed;with call bell/phone within reach (per request)   GP     Juel Burrow 12/23/2012, 12:17 PM

## 2012-12-23 NOTE — Progress Notes (Signed)
Subjective: 2 Days Post-Op Procedure(s) (LRB): INTRAMEDULLARY (IM) NAIL INTERTROCHANTRIC (Right) Patient reports pain as mild.   Increases with moving    Recent Labs  12/21/12 0033 12/22/12 0532 12/23/12 0549  HGB 14.3 11.5* 10.9*    Recent Labs  12/22/12 0532 12/23/12 0549  WBC 10.4 9.3  RBC 3.78* 3.62*  HCT 34.3* 32.9*  PLT 170 158    Neurologically intact ABD soft Neurovascular intact Sensation intact distally Intact pulses distally Dorsiflexion/Plantar flexion intact Compartment soft  Assessment/Plan: 2 Days Post-Op Procedure(s) (LRB): INTRAMEDULLARY (IM) NAIL INTERTROCHANTRIC (Right) Advance diet Up with therapy D/C IV fluids Arrange snf for [facility] 12/23/2012, 7:19 AM

## 2012-12-23 NOTE — Clinical Social Work Note (Signed)
PT now recommending SNF. CSW discussed with pt who is agreeable and requests [facility] if possible. Aware of copays. CSW will follow up with bed offers when available.  Derenda Fennel, [place] [phone]

## 2012-12-24 ENCOUNTER — Inpatient Hospital Stay
Admission: RE | Admit: 2012-12-24 | Discharge: 2013-01-14 | Disposition: A | Discharge status: Home / Self Care | Payer: Medicare Other | Source: Ambulatory Visit | Attending: Internal Medicine | Admitting: Internal Medicine

## 2012-12-24 ENCOUNTER — Encounter (HOSPITAL_COMMUNITY): Payer: Self-pay | Admitting: Orthopedic Surgery

## 2012-12-24 ENCOUNTER — Other Ambulatory Visit: Payer: Self-pay | Admitting: *Deleted

## 2012-12-24 DIAGNOSIS — S72143A Displaced intertrochanteric fracture of unspecified femur, initial encounter for closed fracture: Secondary | ICD-10-CM

## 2012-12-24 LAB — CBC
HCT: 30.2 % — ABNORMAL LOW (ref 36.0–46.0)
Hemoglobin: 10.3 g/dL — ABNORMAL LOW (ref 12.0–15.0)
MCH: 30.8 pg (ref 26.0–34.0)
MCHC: 34.1 g/dL (ref 30.0–36.0)
RBC: 3.34 MIL/uL — ABNORMAL LOW (ref 3.87–5.11)

## 2012-12-24 MED ORDER — ENOXAPARIN SODIUM 30 MG/0.3ML ~~LOC~~ SOLN: 30.0000 mg | Freq: Every day | SUBCUTANEOUS | Status: DC

## 2012-12-24 MED ORDER — HYDROCODONE-ACETAMINOPHEN 7.5-325 MG PO TABS: ORAL_TABLET | ORAL | Status: DC

## 2012-12-24 MED ORDER — BISACODYL 5 MG PO TBEC: 5.0000 mg | DELAYED_RELEASE_TABLET | Freq: Every day | ORAL | Status: DC | PRN

## 2012-12-24 MED ORDER — HYDROCODONE-ACETAMINOPHEN 7.5-325 MG PO TABS: 1.0000 | ORAL_TABLET | ORAL | Status: DC

## 2012-12-24 NOTE — Progress Notes (Signed)
Patient discharged to [facility] report called,and given to [facility] LPN. Vital signs stable. No c/o pain or discomfort noted.Accompanied by staff to awaiting floor.

## 2012-12-24 NOTE — Discharge Summary (Signed)
Physician Discharge Summary  Angela [name] ZOX:[id number] DOB: [birth year] DOA: 12/20/2012  PCP: Dwana Melena, MD  Admit date: 12/20/2012 Discharge date: 12/24/2012  Time spent: 40  minutes  Recommendations for Outpatient Follow-up:  1. Call for appointment with Dr. Romeo Apple 2 weeks  Discharge Diagnoses:  Active Problems:   Closed hip fracture   Intertrochanteric fracture of right hip   Acute blood loss anemia   Discharge Condition: stable  Diet recommendation: heart healthy  Filed Weights   12/20/12 2310 12/21/12 1308  Weight: 55.339 kg (122 lb) 55.339 kg (122 lb)    History of present illness:  Angela [name] is a very pleasant 72 y.o. female previously healthy with only home medicine of 81mg  ASA, who was stepping over safety gate at home while holding her dog, tripped and fell on her right hip. She had pain there immediately. X ray showed non displaced intertrochanteric fx of the right femoral neck. Her other work up included a clear CXR, EKG with SNR and no acute ischemic changes, her Hb of 14g/DL, and her renal fx tests were normal. Dr Monico Blitz of orthopedics was consulted. Patient would like to have Dr Romeo Apple operated on her. Hospitalist was asked to admit her for right hip Fx.   Hospital Course:  Right hip fx: s/p mechanical fall.  Admitted to medical floor. Underwent OTIF on 12/21/12. She remained afebrile, with stable VS. Pain within manageable limits. Evaluated by PT and SNF recommended.    Anemia: likely related to above. Expect it to resolve. Stable at 10.6.       Procedures:  OTIF 12/21/12  Consultations:  Dr. Jonathon Resides  Discharge Exam: Filed Vitals:   12/24/12 0000 12/24/12 0400 12/24/12 0500 12/24/12 0800  BP:   117/53   Pulse:   91   Temp:   98.4 F (36.9 C)   TempSrc:   Oral   Resp: 18 18 20 19   Height:      Weight:      SpO2:  95% 96% 96%    General: awake alert NAD Cardiovascular: RRR No MGR No LEE  Respiratory: normal effort BSCTAB  no wheeze   Discharge Instructions  Discharge Orders   Future Orders Complete By Expires     Call MD / Call 911  As directed     Comments:      If you experience chest pain or shortness of breath, CALL 911 and be transported to the hospital emergency room.  If you develope a fever above 101 F, pus (white drainage) or increased drainage or redness at the wound, or calf pain, call your surgeon's office.    Constipation Prevention  As directed     Comments:      Drink plenty of fluids.  Prune juice may be helpful.  You may use a stool softener, such as Colace (over the counter) 100 mg twice a day.  Use MiraLax (over the counter) for constipation as needed.    Diet - low sodium heart healthy  As directed     Diet - low sodium heart healthy  As directed     Discharge instructions  As directed     Comments:      HIP FRACTURE INSTRUCTIONS: TAKE STAPLES OUT POD 12 APPLY STERI STRIPS WEIGHT BEARING AS TOLERATED WITH A WALKER X 6 WEEKS  LOVENOX X 30 DAYS  HIP PRECAUTIONS: NO    Discharge instructions  As directed     Comments:      Remove staples post  op day 12. Apply steri-strips Weight bearing as tolerated with walker for 6 weeks Lovenox daily for 30 days    Increase activity slowly as tolerated  As directed     Increase activity slowly  As directed         Medication List    STOP taking these medications       CRANBERRY PO      TAKE these medications       aspirin 81 MG tablet  Take 81 mg by mouth daily.     bisacodyl 5 MG EC tablet  Commonly known as:  DULCOLAX  Take 1 tablet (5 mg total) by mouth daily as needed.     HYDROcodone-acetaminophen 7.5-325 MG per tablet  Commonly known as:  NORCO  Take 1 tablet by mouth every 4 (four) hours.    Lovenox 30 mg daily for 30 days.        Follow-up Information   Follow up with Fuller Canada, MD. Schedule an appointment as soon as possible for a visit in 2 weeks.   Contact information:   [address], [address], [place] [address] [phone]        The results of significant diagnostics from this hospitalization (including imaging, microbiology, ancillary and laboratory) are listed below for reference.    Significant Diagnostic Studies: Dg Chest 1 View  12/21/2012  *RADIOLOGY REPORT*  Clinical Data: Intertrochanteric right femoral neck fracture. Preoperative respiratory evaluation.  CHEST - 1 VIEW  Comparison: None.  Findings: Cardiomediastinal silhouette unremarkable for the AP supine technique.  Suboptimal inspiration accounts for crowded bronchovascular markings diffusely.  Taking this into account, lungs clear.  IMPRESSION: Suboptimal inspiration.  No acute cardiopulmonary disease.   Original Report Authenticated By: Hulan Saas, M.D.    Dg Hip Complete Right  12/21/2012  *RADIOLOGY REPORT*  Clinical Data:  Larey Seat at home, injuring the right hip.  RIGHT HIP - COMPLETE 2+ VIEW  Comparison: None.  Findings: Intertrochanteric right femoral neck fracture without significant displacement.  Severe joint space narrowing in the right hip with protrusio deformity.  Included AP pelvis demonstrates a normal-appearing contralateral left hip.  Sacroiliac joints intact with mild degenerative changes. Symphysis pubis intact.  Bone mineral density well preserved. Degenerative changes involving the visualized lower lumbar spine.  IMPRESSION: Nondisplaced intertrochanteric right femoral neck fracture.  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   Original Report Authenticated By: Hulan Saas, M.D.    Dg Hip Operative Right  12/21/2012  *RADIOLOGY REPORT*  Clinical Data: Right hip gamma nail placement.  DG OPERATIVE RIGHT HIP  Comparison: Radiograph 12/20/2012.  Findings: Series of 10 intraoperative fluoroscopic spot films demonstrate placement of a gamma nail with short femoral nail.  IMPRESSION: ORIF of right hip fracture with short gamma nail   Original Report  Authenticated By: Andreas Newport, M.D.    Mm Digital Screening  12/03/2012  *RADIOLOGY REPORT*  Clinical Data: Screening.  DIGITAL BILATERAL SCREENING MAMMOGRAM WITH CAD  Comparison:  Previous exams.  FINDINGS:  ACR Breast Density Category 3: The breast tissue is heterogeneously dense.  No suspicious masses, architectural distortion, or calcifications are present.  Images were processed with CAD.  IMPRESSION: No mammographic evidence of malignancy.  A result letter of this screening mammogram will be mailed directly to the patient.  RECOMMENDATION: Screening mammogram in one year. (Code:SM-B-01Y)  BI-RADS CATEGORY 1:  Negative.   Original Report Authenticated By: Norva Pavlov, M.D.     Microbiology:  Recent Results (from the past 240 hour(s))  URINE CULTURE     Status: None   Collection Time    12/21/12  1:21 AM      Result Value Range Status   Specimen Description URINE, CATHETERIZED   Final   Special Requests NONE   Final   Culture  Setup Time 12/21/2012 02:05   Final   Colony Count >=100,000 COLONIES/ML   Final   Culture ESCHERICHIA COLI   Final   Report Status 12/22/2012 FINAL   Final   Organism ID, Bacteria ESCHERICHIA COLI   Final  SURGICAL PCR SCREEN     Status: None   Collection Time    12/21/12 10:34 AM      Result Value Range Status   MRSA, PCR NEGATIVE  NEGATIVE Final   Staphylococcus aureus NEGATIVE  NEGATIVE Final   Comment:            The Xpert SA Assay (FDA     approved for NASAL specimens     in patients over 81 years of age),     is one component of     a comprehensive surveillance     program.  Test performance has     been validated by [employer] for patients greater     than or equal to 14 year old.     It is not intended     to diagnose infection nor to     guide or monitor treatment.     Labs: Basic Metabolic Panel:  Recent Labs Lab 12/21/12 0033 12/22/12 0532 12/23/12 0549  NA 139 136 142  K 3.8 4.0 3.9  CL 103 102 106  CO2 25 28 30    GLUCOSE 111* 127* 103*  BUN 18 9 7   CREATININE 0.72 0.62 0.64  CALCIUM 9.1 8.5 8.7       CBC:  Recent Labs Lab 12/21/12 0033 12/22/12 0532 12/23/12 0549 12/24/12 0539  WBC 9.0 10.4 9.3 9.4  NEUTROABS 6.5  --   --   --   HGB 14.3 11.5* 10.9* 10.3*  HCT 42.7 34.3* 32.9* 30.2*  MCV 89.3 90.7 90.9 90.4  PLT 229 170 158 151       Signed:  BLACK,KAREN M  [facility] 12/24/2012, 9:29 AM Attending: Patient seen and examined and the above note reviewed. Patient is medically stable for discharge to skilled nursing facility for rehabilitation.

## 2012-12-24 NOTE — Clinical Social Work Note (Signed)
Pt to d/c today to SNF. CSW presented bed offers and pt chooses [facility]. Facility notified. CSW will fax d/c summary when complete. Pt to transfer with RN.   Derenda Fennel, [place] [phone]

## 2012-12-24 NOTE — Clinical Social Work Placement (Signed)
Clinical Social Work Department CLINICAL SOCIAL WORK PLACEMENT NOTE 12/24/2012  Patient:  Angela [name], Angela [name]  Account Number:  [id number] Admit date:  12/20/2012  Clinical Social Worker:  Derenda Fennel, LCSW  Date/time:  12/23/2012 02:43 PM  Clinical Social Work is seeking post-discharge placement for this patient at the following level of care:   SKILLED NURSING   (*CSW will update this form in Epic as items are completed)   12/23/2012  Patient/family provided with [facility] Department of Clinical Social Work's list of facilities offering this level of care within the geographic area requested by the patient (or if unable, by the patient's family).  12/23/2012  Patient/family informed of their freedom to choose among providers that offer the needed level of care, that participate in Medicare, Medicaid or managed care program needed by the patient, have an available bed and are willing to accept the patient.  12/23/2012  Patient/family informed of [facility]' ownership interest in [facility], as well as of the fact that they are under no obligation to receive care at this facility.  PASARR submitted to EDS on 12/23/2012 PASARR number received from EDS on 12/23/2012  FL2 transmitted to all facilities in geographic area requested by pt/family on  12/23/2012 FL2 transmitted to all facilities within larger geographic area on   Patient informed that his/her managed care company has contracts with or will negotiate with  certain facilities, including the following:     Patient/family informed of bed offers received:  12/24/2012 Patient chooses bed at [facility] Physician recommends and patient chooses bed at  [facility]  Patient to be transferred to [facility] on  12/24/2012 Patient to be transferred to facility by RN  The following physician request were entered in Epic:   Additional Comments:  Derenda Fennel, LCSW [phone]

## 2012-12-25 ENCOUNTER — Non-Acute Institutional Stay (SKILLED_NURSING_FACILITY): Payer: Medicare Other | Admitting: Internal Medicine

## 2012-12-25 DIAGNOSIS — S72009D Fracture of unspecified part of neck of unspecified femur, subsequent encounter for closed fracture with routine healing: Secondary | ICD-10-CM

## 2012-12-25 DIAGNOSIS — M1611 Unilateral primary osteoarthritis, right hip: Secondary | ICD-10-CM

## 2012-12-25 DIAGNOSIS — D649 Anemia, unspecified: Secondary | ICD-10-CM

## 2012-12-25 DIAGNOSIS — S72141D Displaced intertrochanteric fracture of right femur, subsequent encounter for closed fracture with routine healing: Secondary | ICD-10-CM

## 2012-12-25 DIAGNOSIS — M169 Osteoarthritis of hip, unspecified: Secondary | ICD-10-CM

## 2012-12-29 ENCOUNTER — Non-Acute Institutional Stay (SKILLED_NURSING_FACILITY): Payer: Medicare Other | Admitting: Internal Medicine

## 2012-12-29 DIAGNOSIS — F5102 Adjustment insomnia: Secondary | ICD-10-CM

## 2012-12-29 NOTE — Progress Notes (Signed)
Patient ID: Angela [name], female   DOB: 1941/02/09, 72 y.o.   MRN: [id number] Chief complaint; exhaustion/insomnia  History; this is a patient who came to [place] after suffering a closed right hip fracture.  She underwent ORIF which was reasonably uneventful by Dr. Romeo Apple.  Since her arrival here her as she is complaining about generalized fatigue, exhaustion, and insomnia.  She states she has not slept in 10 days.  She feels particularly exhausted after therapy.  Her insomnia seems to have multiple possible contributors including some pain, urinary frequency 2 times at night, and it seems just nocturnal restlessness.  I have looked at her today because of these concerns.  Review of systems; Respiratory no shortness of breath Cardiac no chest pain GI some constipation GU no dysuria but some urinary frequency which I don't think is new Musculoskeletal she complains of some right hip pain but I don't get the sense this is unstable.  He is however asking for Robaxin  Physical exam Respiratory clear entry bilaterally Cardiac heart sounds are normal there is no murmurs Musculoskeletal right hip incision looks perfect without evidence of infection.  Impression/plan Insomnia I think this is situational going to give her a nocturnal status.  Also honor her request for Robaxin which might help some with her urinary frequency as well.  Do not see a major issue here.  Although she is somewhat sad I don't think that this represents major depression

## 2013-01-04 NOTE — Progress Notes (Shared)
Patient ID: Angela [name], female   DOB: Oct 06, 1941, 72 y.o.   MRN: [id number]          HISTORY & PHYSICAL  DATE:  03.22.2014   FACILITY: [facility]   LEVEL OF CARE:  SNF   CHIEF COMPLAINT:   This is an admission to SNF, status post stay at [facility], 12/20/2012 through 12/24/2012.  HISTORY OF PRESENT ILLNESS:  Review, status post intertrochanteric fracture of the right hip.   This is a 72 year-old, independent woman who lives at home with her husband in [place].  She tripped over her dog gate and fell on her right hip.  She suffered a nondisplaced intertrochanteric fracture and this was surgically repaired by Dr. Romeo Apple, status post ORIF.  She appears to have tolerated her postoperative course well.  She is on Lovenox for DVT prophylaxis.  She tells me that she has no prior history of falls or fractures.  She is unaware of her bone density test status.    PAST MEDICAL HISTORY:  Severe osteoarthritis of the right hip.    Generalized osteoarthritis   CURRENT MEDICATIONS:  Medication list is reviewed.  I will probably change her to Xarelto from Lovenox.  I have discussed this with her.    SOCIAL HISTORY: HOUSING:  The patient lives in her own home in [place].  It is a two-level.  However, her bedroom and bathroom are all on the main level.  No stair issues here.    FAMILY HISTORY:  None related by the patient.    REVIEW OF SYSTEMS:   CHEST/RESPIRATORY:  No cough, shortness of breath.  CARDIAC:   No exertional chest pain.  No cardiac history.   GI:  No nausea or vomiting.  She is having bowel movements.  They are formed.  GU:  No dysuria, but significant urinary frequency which may represent a UTI.  She had a catheter removed after she arrived here from the hospital.  I will give this over the weekend to settle in.  Otherwise, we will get a urine culture.    PHYSICAL EXAMINATION:   GENERAL APPEARANCE:  Very well-looking woman in no distress.    CHEST/RESPIRATORY:  Clear air entry bilaterally.   CARDIOVASCULAR:  CARDIAC:   Heart sounds are normal.  No murmurs.   GASTROINTESTINAL:  LIVER/SPLEEN/KIDNEYS:  No liver, no spleen.   ABDOMEN:  No tenderness.  No masses.   GENITOURINARY:   BLADDER:  No fullness or tenderness.  No CVA tenderness.   CIRCULATION:  EXTREMITIES:  No evidence of a DVT.   MUSCULOSKELETAL:   GAIT/STATION:  The patient is already able to get up and is using a walker.      ASSESSMENT/PLAN:  Status post right hip fracture.  She appears to be stable.  We will change to Xarelto for DVT prophylaxis.  Check her hemoglobin to check her postoperative anemia.  She is unaware of her bone density status.  She should have a duplex ultrasound after she sees her primary physician, Dr. Dwana Melena.    Severe osteoarthritis of the right hip.  This has also been noted on her hip x-rays.  Nevertheless, she appears to be rehabilitating well.    Postoperative anemia.  We will recheck.    I think this lady's stay here will be short.  No significant medical issues are identified.     CPT CODE: 78295.

## 2013-01-12 ENCOUNTER — Non-Acute Institutional Stay (SKILLED_NURSING_FACILITY): Payer: Medicare Other | Admitting: Internal Medicine

## 2013-01-12 DIAGNOSIS — D649 Anemia, unspecified: Secondary | ICD-10-CM

## 2013-01-12 DIAGNOSIS — S72141D Displaced intertrochanteric fracture of right femur, subsequent encounter for closed fracture with routine healing: Secondary | ICD-10-CM

## 2013-01-12 DIAGNOSIS — S72009D Fracture of unspecified part of neck of unspecified femur, subsequent encounter for closed fracture with routine healing: Secondary | ICD-10-CM

## 2013-01-12 NOTE — Progress Notes (Signed)
Patient ID: Angela [name], female   DOB: August 29, 1941, 72 y.o.   MRN: [id number] Chief complaint; predischarge review status post repair of intertrochanteric fracture of the right femoral neck repair by Dr. Romeo Apple.  History; Mrs. Angela [name] is a relatively independent 72 year old woman who fell while carrying her dog over  A dog date.  Her hip fracture was repaired with ORIF.  She has done very well was up ambulating independently with a straight cane.  She was discharged on Lovenox for 30 days her surgery was on 12/20/2012.  I changed her actually to xarelto for DVT prophylaxis.  Socially the patient lives in her own home with her husband in [place].  There are no stairs to manage.  On examination Get up and go test was done she doses perfectly well he uses a straight cane adequately. Extremities no edema  Impressions/plan #1 right hip fracture for rehabilitation as well she is more than ready for discharge home on April 11.  I think she can go to outpatient PT and OT.  She does not have a history of falls.  Does not know her bone density.  I would be worthwhile having a bone density test and asked her to discuss this with her primary physician.

## 2013-01-13 ENCOUNTER — Ambulatory Visit (INDEPENDENT_AMBULATORY_CARE_PROVIDER_SITE_OTHER): Payer: Medicare Other | Admitting: Orthopedic Surgery

## 2013-01-13 ENCOUNTER — Encounter: Payer: Self-pay | Admitting: Orthopedic Surgery

## 2013-01-13 VITALS — BP 110/58 | Ht 60.0 in | Wt 129.0 lb

## 2013-01-13 DIAGNOSIS — S72141D Displaced intertrochanteric fracture of right femur, subsequent encounter for closed fracture with routine healing: Secondary | ICD-10-CM

## 2013-01-13 DIAGNOSIS — S72009D Fracture of unspecified part of neck of unspecified femur, subsequent encounter for closed fracture with routine healing: Secondary | ICD-10-CM

## 2013-01-13 NOTE — Progress Notes (Signed)
Patient ID: Angela [name], female   DOB: [birth year], 72 y.o.   MRN: [id number] Gamma nail right hip  Chief Complaint  Patient presents with  . Follow-up    Follow up right hip fracture DOS 12/21/12    First postop visit wound check  Incision is clean dry and intact no sign of infection  Patient imagery with a cane schedule the leave [facility] to go home tomorrow

## 2013-01-13 NOTE — Patient Instructions (Addendum)
activities as tolerated 

## 2013-01-17 ENCOUNTER — Ambulatory Visit (HOSPITAL_COMMUNITY): Payer: Medicare Other | Admitting: Physical Therapy

## 2013-02-15 ENCOUNTER — Ambulatory Visit (INDEPENDENT_AMBULATORY_CARE_PROVIDER_SITE_OTHER): Payer: Medicare Other

## 2013-02-15 ENCOUNTER — Ambulatory Visit (INDEPENDENT_AMBULATORY_CARE_PROVIDER_SITE_OTHER): Payer: Medicare Other | Admitting: Orthopedic Surgery

## 2013-02-15 ENCOUNTER — Encounter: Payer: Self-pay | Admitting: Orthopedic Surgery

## 2013-02-15 VITALS — BP 110/62 | Ht 60.0 in | Wt 129.0 lb

## 2013-02-15 DIAGNOSIS — S72009D Fracture of unspecified part of neck of unspecified femur, subsequent encounter for closed fracture with routine healing: Secondary | ICD-10-CM

## 2013-02-15 DIAGNOSIS — S72141D Displaced intertrochanteric fracture of right femur, subsequent encounter for closed fracture with routine healing: Secondary | ICD-10-CM

## 2013-02-15 NOTE — Patient Instructions (Signed)
-  Resume normal activities

## 2013-02-15 NOTE — Progress Notes (Signed)
Patient ID: Angela [name], female   DOB: Aug 30, 1941, 72 y.o.   MRN: [id number] Chief Complaint  Patient presents with  . Follow-up    1 month recheck on right hip gamma nail short. DOS 12-21-12.    BP 110/62  Ht 5' (1.524 m)  Wt 129 lb (58.514 kg)  BMI 25.19 kg/m2  Intertrochanteric fracture of right hip, closed, with routine healing, subsequent encounter - Plan: DG Hip Complete Right   Followup intertrochanteric fracture right hip patient is now ambulatory without any assistive devices takes 2 Aleve at night for pain walking normally  Clinical exam shows equal leg lengths normal hip motion despite hip arthritis  She can resume normal activities come back in a month no x-ray needed

## 2013-03-24 ENCOUNTER — Encounter: Payer: Self-pay | Admitting: Orthopedic Surgery

## 2013-03-24 ENCOUNTER — Ambulatory Visit (INDEPENDENT_AMBULATORY_CARE_PROVIDER_SITE_OTHER): Payer: Medicare Other | Admitting: Orthopedic Surgery

## 2013-03-24 VITALS — BP 104/62 | Ht 60.0 in | Wt 129.0 lb

## 2013-03-24 DIAGNOSIS — S72009D Fracture of unspecified part of neck of unspecified femur, subsequent encounter for closed fracture with routine healing: Secondary | ICD-10-CM

## 2013-03-24 DIAGNOSIS — S72141D Displaced intertrochanteric fracture of right femur, subsequent encounter for closed fracture with routine healing: Secondary | ICD-10-CM

## 2013-03-24 NOTE — Patient Instructions (Addendum)
activities as tolerated 

## 2013-03-24 NOTE — Progress Notes (Signed)
Patient ID: Angela [name], female   DOB: 1941-02-17, 72 y.o.   MRN: [id number] Chief Complaint  Patient presents with  . Follow-up    5 week recheck on right hip gamma nail short. DOS 12-21-12.     BP 104/62  Ht 5' (1.524 m)  Wt 129 lb (58.514 kg)  BMI 25.19 kg/m2  Status post right hip gamma nail fixation 3 months out patient is doing well her only complaint is some difficulty going up the stairs but she says getting better. She denies hip pain  Her leg lengths are equal she is walking without support is no limp. Her hip flexion is 100 is normal abduction and adduction  Impression fractured right hip intertrochanteric Status post internal fixation with gamma nail Fracture healing was noted on last x-ray patient has been released

## 2013-12-27 ENCOUNTER — Other Ambulatory Visit (HOSPITAL_COMMUNITY): Payer: Self-pay | Admitting: Internal Medicine

## 2013-12-27 DIAGNOSIS — Z1231 Encounter for screening mammogram for malignant neoplasm of breast: Secondary | ICD-10-CM

## 2013-12-29 ENCOUNTER — Ambulatory Visit (HOSPITAL_COMMUNITY)
Admission: RE | Admit: 2013-12-29 | Discharge: 2013-12-29 | Disposition: A | Discharge status: Home / Self Care | Payer: Medicare Other | Source: Ambulatory Visit | Attending: Internal Medicine | Admitting: Internal Medicine

## 2013-12-29 DIAGNOSIS — Z1231 Encounter for screening mammogram for malignant neoplasm of breast: Secondary | ICD-10-CM | POA: Insufficient documentation

## 2014-10-02 ENCOUNTER — Emergency Department (HOSPITAL_COMMUNITY): Payer: Medicare Other

## 2014-10-02 ENCOUNTER — Encounter (HOSPITAL_COMMUNITY): Payer: Self-pay | Admitting: Emergency Medicine

## 2014-10-02 ENCOUNTER — Emergency Department (HOSPITAL_COMMUNITY)
Admission: EM | Admit: 2014-10-02 | Discharge: 2014-10-02 | Disposition: A | Discharge status: Home / Self Care | Payer: Medicare Other | Attending: Emergency Medicine | Admitting: Emergency Medicine

## 2014-10-02 DIAGNOSIS — M25562 Pain in left knee: Secondary | ICD-10-CM | POA: Diagnosis not present

## 2014-10-02 DIAGNOSIS — Z8619 Personal history of other infectious and parasitic diseases: Secondary | ICD-10-CM | POA: Insufficient documentation

## 2014-10-02 DIAGNOSIS — Z7982 Long term (current) use of aspirin: Secondary | ICD-10-CM | POA: Diagnosis not present

## 2014-10-02 MED ORDER — HYDROCODONE-ACETAMINOPHEN 5-325 MG PO TABS: 1.0000 | ORAL_TABLET | ORAL | Status: DC | PRN

## 2014-10-02 NOTE — ED Provider Notes (Signed)
This chart was scribed for Angela MawKristen N Harveen Flesch, DO by Arlan OrganAshley Leger, ED Scribe. This patient was seen in room APA01/APA01 and the patient's care was started 9:22 AM.   TIME SEEN: 9:23 AM   CHIEF COMPLAINT:  Chief Complaint  Patient presents with  . Knee Pain    HPI:  HPI Comments: Angela GrainSuzanne F [name] is a 73 y.o. female with no significant past medical history who presents to the Emergency Department complaining of constant, moderate L knee pain with associated swelling x 1 week that is unchanged. Pain is described as "stiff". No recent trauma or injury to knee. Pain is exacerbated with weight bearing/movement and alleviated at rest and when sitting. She has tried OTC Aleve without any improvement for symptoms. Pt has a cane at home and uses it PRN.  [name] admits to a history of a R hip fracture and surgical repair. No known allergies to medications.  ROS: See HPI Constitutional: no fever  Eyes: no drainage  ENT: no runny nose   Cardiovascular:  no chest pain  Resp: no SOB  GI: no vomiting GU: no dysuria Integumentary: no rash  Allergy: no hives  Musculoskeletal: no leg swelling. Positive for arthralgias  Neurological: no slurred speech ROS otherwise negative  PAST MEDICAL HISTORY/PAST SURGICAL HISTORY:  Past Medical History  Diagnosis Date  . Clostridium difficile infection 2010    MEDICATIONS:  Prior to Admission medications   Medication Sig Start Date End Date Taking? Authorizing Provider  aspirin 81 MG tablet Take 81 mg by mouth daily.    Historical Provider, MD  bisacodyl (DULCOLAX) 5 MG EC tablet Take 1 tablet (5 mg total) by mouth daily as needed. 12/24/12   Gwenyth BenderKaren M Black, NP  enoxaparin (LOVENOX) 30 MG/0.3ML injection Inject 0.3 mLs (30 mg total) into the skin daily. 12/24/12   Vickki HearingStanley E Harrison, MD  HYDROcodone-acetaminophen [facility](NORCO) 7.5-325 MG per tablet Take 1 tablet every 4 hours as needed for pain 12/24/12   Man Mast X, NP    ALLERGIES:  No Known Allergies  SOCIAL  HISTORY:  History  Substance Use Topics  . Smoking status: Never Smoker   . Smokeless tobacco: Not on file  . Alcohol Use: No    FAMILY HISTORY: History reviewed. No pertinent family history.  EXAM: BP 132/68 mmHg  Pulse 69  Temp(Src) 98.2 F (36.8 C) (Oral)  Resp 18  Ht 5' (1.524 m)  Wt 132 lb (59.875 kg)  BMI 25.78 kg/m2  SpO2 98% CONSTITUTIONAL: Alert and oriented and responds appropriately to questions. Well-appearing; well-nourished; GCS 15 HEAD: Normocephalic; atraumatic EYES: Conjunctivae clear, PERRL, EOMI ENT: normal nose; no rhinorrhea; moist mucous membranes; pharynx without lesions noted; no dental injury; no hemotypanum; no septal hematoma NECK: Supple, no meningismus, no LAD; no midline spinal tenderness, step-off or deformity CARD: RRR; S1 and S2 appreciated; no murmurs, no clicks, no rubs, no gallops; 2 plus DP pulse on L. RESP: Normal chest excursion without splinting or tachypnea; breath sounds clear and equal bilaterally; no wheezes, no rhonchi, no rales; chest wall stable, nontender to palpation ABD/GI: Normal bowel sounds; non-distended; soft, non-tender, no rebound, no guarding PELVIS:  stable, nontender to palpation BACK:  The back appears normal and is non-tender to palpation, there is no CVA tenderness; no midline spinal tenderness, step-off or deformity EXT: Normal ROM in all joints; no edema; normal capillary refill; no cyanosis, minimally tender over medial L knee with no ligament laxity, joint effusion, erythema, or warmth. No calf tenderness or swelling.  No tenderness over foot, ankle, or hip on the left side. SKIN: Normal color for age and race; warm NEURO: Moves all extremities equally; Normal sensation diffusely,  PSYCH: The patient's mood and manner are appropriate. Grooming and personal hygiene are appropriate.  MEDICAL DECISION MAKING: Patient here with left knee pain. Suspect arthritis versus meniscal injury. No ligamentous laxity. No signs of  septic arthritis. Neurovascularly intact distally. Will obtain x-rays.  ED PROGRESS: X-ray show mild joint effusion and mild tricompartmental osteoarthritic changes. No fracture, dislocation. Patient reports feeling better without receiving any pain medication ED refused pain medication here. We'll discharge with prescription for Vicodin. Will apply Ace wrap for compression. Discussed importance of rest, elevation and ice. We'll give with patient follow-up information of symptoms continue. Discussed return precautions.   I personally performed the services described in this documentation, which was scribed in my presence. The recorded information has been reviewed and is accurate.    Angela MawKristen N Angela Grismer, DO 10/02/14 [phone]

## 2014-10-02 NOTE — ED Notes (Signed)
Pt reports left knee pain x1 week. Pt denies any known injury. Pt reports knee feels "stiff". Pt able to bear weight to left knee but with pain.

## 2014-10-02 NOTE — Discharge Instructions (Signed)
Arthritis, Nonspecific °Arthritis is inflammation of a joint. This usually means pain, redness, warmth or swelling are present. One or more joints may be involved. There are a number of types of arthritis. Your caregiver may not be able to tell what type of arthritis you have right away. °CAUSES  °The most common cause of arthritis is the wear and tear on the joint (osteoarthritis). This causes damage to the cartilage, which can break down over time. The knees, hips, back and neck are most often affected by this type of arthritis. °Other types of arthritis and common causes of joint pain include: °· Sprains and other injuries near the joint. Sometimes minor sprains and injuries cause pain and swelling that develop hours later. °· Rheumatoid arthritis. This affects hands, feet and knees. It usually affects both sides of your body at the same time. It is often associated with chronic ailments, fever, weight loss and general weakness. °· Crystal arthritis. Gout and pseudo gout can cause occasional acute severe pain, redness and swelling in the foot, ankle, or knee. °· Infectious arthritis. Bacteria can get into a joint through a break in overlying skin. This can cause infection of the joint. Bacteria and viruses can also spread through the blood and affect your joints. °· Drug, infectious and allergy reactions. Sometimes joints can become mildly painful and slightly swollen with these types of illnesses. °SYMPTOMS  °· Pain is the main symptom. °· Your joint or joints can also be red, swollen and warm or hot to the touch. °· You may have a fever with certain types of arthritis, or even feel overall ill. °· The joint with arthritis will hurt with movement. Stiffness is present with some types of arthritis. °DIAGNOSIS  °Your caregiver will suspect arthritis based on your description of your symptoms and on your exam. Testing may be needed to find the type of arthritis: °· Blood and sometimes urine tests. °· X-ray tests  and sometimes CT or MRI scans. °· Removal of fluid from the joint (arthrocentesis) is done to check for bacteria, crystals or other causes. Your caregiver (or a specialist) will numb the area over the joint with a local anesthetic, and use a needle to remove joint fluid for examination. This procedure is only minimally uncomfortable. °· Even with these tests, your caregiver may not be able to tell what kind of arthritis you have. Consultation with a specialist (rheumatologist) may be helpful. °TREATMENT  °Your caregiver will discuss with you treatment specific to your type of arthritis. If the specific type cannot be determined, then the following general recommendations may apply. °Treatment of severe joint pain includes: °· Rest. °· Elevation. °· Anti-inflammatory medication (for example, ibuprofen) may be prescribed. Avoiding activities that cause increased pain. °· Only take over-the-counter or prescription medicines for pain and discomfort as recommended by your caregiver. °· Cold packs over an inflamed joint may be used for 10 to 15 minutes every hour. Hot packs sometimes feel better, but do not use overnight. Do not use hot packs if you are diabetic without your caregiver's permission. °· A cortisone shot into arthritic joints may help reduce pain and swelling. °· Any acute arthritis that gets worse over the next 1 to 2 days needs to be looked at to be sure there is no joint infection. °Long-term arthritis treatment involves modifying activities and lifestyle to reduce joint stress jarring. This can include weight loss. Also, exercise is needed to nourish the joint cartilage and remove waste. This helps keep the muscles   around the joint strong. HOME CARE INSTRUCTIONS   Do not take aspirin to relieve pain if gout is suspected. This elevates uric acid levels.  Only take over-the-counter or prescription medicines for pain, discomfort or fever as directed by your caregiver.  Rest the joint as much as  possible.  If your joint is swollen, keep it elevated.  Use crutches if the painful joint is in your leg.  Drinking plenty of fluids may help for certain types of arthritis.  Follow your caregiver's dietary instructions.  Try low-impact exercise such as:  Swimming.  Water aerobics.  Biking.  Walking.  Morning stiffness is often relieved by a warm shower.  Put your joints through regular range-of-motion. SEEK MEDICAL CARE IF:   You do not feel better in 24 hours or are getting worse.  You have side effects to medications, or are not getting better with treatment. SEEK IMMEDIATE MEDICAL CARE IF:   You have a fever.  You develop severe joint pain, swelling or redness.  Many joints are involved and become painful and swollen.  There is severe back pain and/or leg weakness.  You have loss of bowel or bladder control. Document Released: 10/30/2004 Document Revised: 12/15/2011 Document Reviewed: 11/15/2008 [facility] Patient Information 2015 [place], [place]. This information is not intended to replace advice given to you by your health care provider. Make sure you discuss any questions you have with your health care provider.  Knee Pain The knee is the complex joint between your thigh and your lower leg. It is made up of bones, tendons, ligaments, and cartilage. The bones that make up the knee are:  The femur in the thigh.  The tibia and fibula in the lower leg.  The patella or kneecap riding in the groove on the lower femur. CAUSES  Knee pain is a common complaint with many causes. A few of these causes are:  Injury, such as:  A ruptured ligament or tendon injury.  Torn cartilage.  Medical conditions, such as:  Gout  Arthritis  Infections  Overuse, over training, or overdoing a physical activity. Knee pain can be minor or severe. Knee pain can accompany debilitating injury. Minor knee problems often respond well to self-care measures or get well on their  own. More serious injuries may need medical intervention or even surgery. SYMPTOMS The knee is complex. Symptoms of knee problems can vary widely. Some of the problems are:  Pain with movement and weight bearing.  Swelling and tenderness.  Buckling of the knee.  Inability to straighten or extend your knee.  Your knee locks and you cannot straighten it.  Warmth and redness with pain and fever.  Deformity or dislocation of the kneecap. DIAGNOSIS  Determining what is wrong may be very straight forward such as when there is an injury. It can also be challenging because of the complexity of the knee. Tests to make a diagnosis may include:  Your caregiver taking a history and doing a physical exam.  Routine X-rays can be used to rule out other problems. X-rays will not reveal a cartilage tear. Some injuries of the knee can be diagnosed by:  Arthroscopy a surgical technique by which a small video camera is inserted through tiny incisions on the sides of the knee. This procedure is used to examine and repair internal knee joint problems. Tiny instruments can be used during arthroscopy to repair the torn knee cartilage (meniscus).  Arthrography is a radiology technique. A contrast liquid is directly injected into the knee  joint. Internal structures of the knee joint then become visible on X-ray film.  An MRI scan is a non X-ray radiology procedure in which magnetic fields and a computer produce two- or three-dimensional images of the inside of the knee. Cartilage tears are often visible using an MRI scanner. MRI scans have largely replaced arthrography in diagnosing cartilage tears of the knee.  Blood work.  Examination of the fluid that helps to lubricate the knee joint (synovial fluid). This is done by taking a sample out using a needle and a syringe. TREATMENT The treatment of knee problems depends on the cause. Some of these treatments are:  Depending on the injury, proper casting,  splinting, surgery, or physical therapy care will be needed.  Give yourself adequate recovery time. Do not overuse your joints. If you begin to get sore during workout routines, back off. Slow down or do fewer repetitions.  For repetitive activities such as cycling or running, maintain your strength and nutrition.  Alternate muscle groups. For example, if you are a weight lifter, work the upper body on one day and the lower body the next.  Either tight or weak muscles do not give the proper support for your knee. Tight or weak muscles do not absorb the stress placed on the knee joint. Keep the muscles surrounding the knee strong.  Take care of mechanical problems.  If you have flat feet, orthotics or special shoes may help. See your caregiver if you need help.  Arch supports, sometimes with wedges on the inner or outer aspect of the heel, can help. These can shift pressure away from the side of the knee most bothered by osteoarthritis.  A brace called an "unloader" brace also may be used to help ease the pressure on the most arthritic side of the knee.  If your caregiver has prescribed crutches, braces, wraps or ice, use as directed. The acronym for this is PRICE. This means protection, rest, ice, compression, and elevation.  Nonsteroidal anti-inflammatory drugs (NSAIDs), can help relieve pain. But if taken immediately after an injury, they may actually increase swelling. Take NSAIDs with food in your stomach. Stop them if you develop stomach problems. Do not take these if you have a history of ulcers, stomach pain, or bleeding from the bowel. Do not take without your caregiver's approval if you have problems with fluid retention, heart failure, or kidney problems.  For ongoing knee problems, physical therapy may be helpful.  Glucosamine and chondroitin are over-the-counter dietary supplements. Both may help relieve the pain of osteoarthritis in the knee. These medicines are different from  the usual anti-inflammatory drugs. Glucosamine may decrease the rate of cartilage destruction.  Injections of a corticosteroid drug into your knee joint may help reduce the symptoms of an arthritis flare-up. They may provide pain relief that lasts a few months. You may have to wait a few months between injections. The injections do have a small increased risk of infection, water retention, and elevated blood sugar levels.  Hyaluronic acid injected into damaged joints may ease pain and provide lubrication. These injections may work by reducing inflammation. A series of shots may give relief for as long as 6 months.  Topical painkillers. Applying certain ointments to your skin may help relieve the pain and stiffness of osteoarthritis. Ask your pharmacist for suggestions. Many over the-counter products are approved for temporary relief of arthritis pain.  In some countries, doctors often prescribe topical NSAIDs for relief of chronic conditions such as arthritis and tendinitis.  A review of treatment with NSAID creams found that they worked as well as oral medications but without the serious side effects. PREVENTION  Maintain a healthy weight. Extra pounds put more strain on your joints.  Get strong, stay limber. Weak muscles are a common cause of knee injuries. Stretching is important. Include flexibility exercises in your workouts.  Be smart about exercise. If you have osteoarthritis, chronic knee pain or recurring injuries, you may need to change the way you exercise. This does not mean you have to stop being active. If your knees ache after jogging or playing basketball, consider switching to swimming, water aerobics, or other low-impact activities, at least for a few days a week. Sometimes limiting high-impact activities will provide relief.  Make sure your shoes fit well. Choose footwear that is right for your sport.  Protect your knees. Use the proper gear for knee-sensitive activities. Use  kneepads when playing volleyball or laying carpet. Buckle your seat belt every time you drive. Most shattered kneecaps occur in car accidents.  Rest when you are tired. SEEK MEDICAL CARE IF:  You have knee pain that is continual and does not seem to be getting better.  SEEK IMMEDIATE MEDICAL CARE IF:  Your knee joint feels hot to the touch and you have a high fever. MAKE SURE YOU:   Understand these instructions.  Will watch your condition.  Will get help right away if you are not doing well or get worse. Document Released: 07/20/2007 Document Revised: 12/15/2011 Document Reviewed: 07/20/2007 [facility]Exit[facility] Patient Information 2015 [place], [place]. This information is not intended to replace advice given to you by your health care provider. Make sure you discuss any questions you have with your health care provider.    RICE: Routine Care for Injuries The routine care of many injuries includes Rest, Ice, Compression, and Elevation (RICE). HOME CARE INSTRUCTIONS  Rest is needed to allow your body to heal. Routine activities can usually be resumed when comfortable. Injured tendons and bones can take up to 6 weeks to heal. Tendons are the cord-like structures that attach muscle to bone.  Ice following an injury helps keep the swelling down and reduces pain.  Put ice in a plastic bag.  Place a towel between your skin and the bag.  Leave the ice on for 15-20 minutes, 3-4 times a day, or as directed by your health care provider. Do this while awake, for the first 24 to 48 hours. After that, continue as directed by your caregiver.  Compression helps keep swelling down. It also gives support and helps with discomfort. If an elastic bandage has been applied, it should be removed and reapplied every 3 to 4 hours. It should not be applied tightly, but firmly enough to keep swelling down. Watch fingers or toes for swelling, bluish discoloration, coldness, numbness, or excessive pain. If any of these  problems occur, remove the bandage and reapply loosely. Contact your caregiver if these problems continue.  Elevation helps reduce swelling and decreases pain. With extremities, such as the arms, hands, legs, and feet, the injured area should be placed near or above the level of the heart, if possible. SEEK IMMEDIATE MEDICAL CARE IF:  You have persistent pain and swelling.  You develop redness, numbness, or unexpected weakness.  Your symptoms are getting worse rather than improving after several days. These symptoms may indicate that further evaluation or further X-rays are needed. Sometimes, X-rays may not show a small broken bone (fracture) until 1 week or 10  days later. Make a follow-up appointment with your caregiver. Ask when your X-ray results will be ready. Make sure you get your X-ray results. Document Released: 01/04/2001 Document Revised: 09/27/2013 Document Reviewed: 02/21/2011 [facility] Patient Information 2015 [place], [place]. This information is not intended to replace advice given to you by your health care provider. Make sure you discuss any questions you have with your health care provider.

## 2014-10-05 ENCOUNTER — Encounter: Payer: Self-pay | Admitting: Orthopedic Surgery

## 2014-10-05 ENCOUNTER — Ambulatory Visit (INDEPENDENT_AMBULATORY_CARE_PROVIDER_SITE_OTHER): Payer: Medicare Other | Admitting: Orthopedic Surgery

## 2014-10-05 VITALS — BP 130/89 | Ht 60.0 in | Wt 132.0 lb

## 2014-10-05 DIAGNOSIS — M25462 Effusion, left knee: Secondary | ICD-10-CM

## 2014-10-05 DIAGNOSIS — M25469 Effusion, unspecified knee: Secondary | ICD-10-CM | POA: Insufficient documentation

## 2014-10-05 NOTE — Patient Instructions (Signed)

## 2014-10-05 NOTE — Progress Notes (Signed)
Patient ID: Angela [name], female   DOB: August 19, 1941, 73 y.o.   MRN: [id number] Patient ID: Angela GrainSuzanne F [name], female   DOB: August 19, 1941, 73 y.o.   MRN: [id number]  Chief Complaint  Patient presents with  . Knee Pain    left knee pain x 1 week, no known injury    HPI Angela [name] is a 73 y.o. female.   She comes in for pain swelling in the left knee no injury. She does not recall any injury but has swelling and pain in the back of her knee described as constant stabbing aching associated with it giving out feeling of the knee. Self treatment with Aleve helped temporarily.  HPI  Review of Systems Review of Systems  negative review of systems  Past Medical History  Diagnosis Date  . Clostridium difficile infection 2010    Past Surgical History  Procedure Laterality Date  . Skin biopsy      3x 2013  . Tonsillectomy    . Intramedullary (im) nail intertrochanteric Right 12/21/2012    Procedure: INTRAMEDULLARY (IM) NAIL INTERTROCHANTRIC;  Surgeon: Vickki HearingStanley E Razan Siler, MD;  Location: [facility];  Service: Orthopedics;  Laterality: Right;    History reviewed. No pertinent family history.  Social History History  Substance Use Topics  . Smoking status: Never Smoker   . Smokeless tobacco: Not on file  . Alcohol Use: No    No Known Allergies  Current Outpatient Prescriptions  Medication Sig Dispense Refill  . aspirin 81 MG tablet Take 81 mg by mouth daily.    Marland Kitchen. CRANBERRY PO Take by mouth.    . naproxen sodium (ANAPROX) 220 MG tablet Take 220 mg by mouth 2 (two) times daily with a meal.    . bisacodyl (DULCOLAX) 5 MG EC tablet Take 1 tablet (5 mg total) by mouth daily as needed. (Patient not taking: Reported on 10/05/2014) 30 tablet 0  . enoxaparin (LOVENOX) 30 MG/0.3ML injection Inject 0.3 mLs (30 mg total) into the skin daily. (Patient not taking: Reported on 10/05/2014) 30 Syringe 0  . HYDROcodone-acetaminophen (NORCO) 7.5-325 MG per tablet Take 1 tablet every 4 hours as needed  for pain (Patient not taking: Reported on 10/05/2014) 180 tablet 5  . HYDROcodone-acetaminophen (NORCO/VICODIN) 5-325 MG per tablet Take 1 tablet by mouth every 4 (four) hours as needed. (Patient not taking: Reported on 10/05/2014) 15 tablet 0   No current facility-administered medications for this visit.       Physical Exam Blood pressure 130/89, height 5' (1.524 m), weight 132 lb (59.875 kg). Physical Exam  appearance normal vital stable oriented 3 mood affect normal gait unremarkable without a limp. Chest symmetric pulses on the dorsum of the foot no peripheral edema normal sensation in both legs. Motor exam remains normal with grade 5 strength in her quadriceps normal muscle tone ligaments are stable she has painful range of motion and tightness in the left knee with normal range of motion stability and strength in the right knee. She has a left knee palpable joint effusion.    Data Reviewed  x-ray review independent : mild degenerative arthritis with effusion  Assessment     Encounter Diagnosis  Name Primary?  . Effusion of knee joint, left Yes       Plan     aspirate inject left knee continue ice twice a day as needed over 1-2 weeks call back after a month if no improvement  Procedure note injection and aspiration left knee joint  Verbal consent  was obtained to aspirate and inject the left knee joint   Timeout was completed to confirm the site of aspiration and injection  An 18-gauge needle was used to aspirate the left knee joint from a suprapatellar lateral approach.  The medications used were 40 mg of Depo-Medrol and 1% lidocaine 3 cc  Anesthesia was provided by ethyl chloride and the skin was prepped with alcohol.  After cleaning the skin with alcohol an 18-gauge needle was used to aspirate the right knee joint.  We obtained 40 cc of fluid  We follow this by injection of 40 mg of Depo-Medrol and 3 cc 1% lidocaine.  There were no complications. A sterile  bandage was applied.

## 2014-10-11 IMAGING — CR DG HIP COMPLETE 2+V*R*
3 series · 3 of 3 positions shown · non-contrast
Comparison: None.

CLINICAL DATA: Fell at home, injuring the right hip.

RIGHT HIP - COMPLETE 2+ VIEW

[view not recorded (1 of 3)]
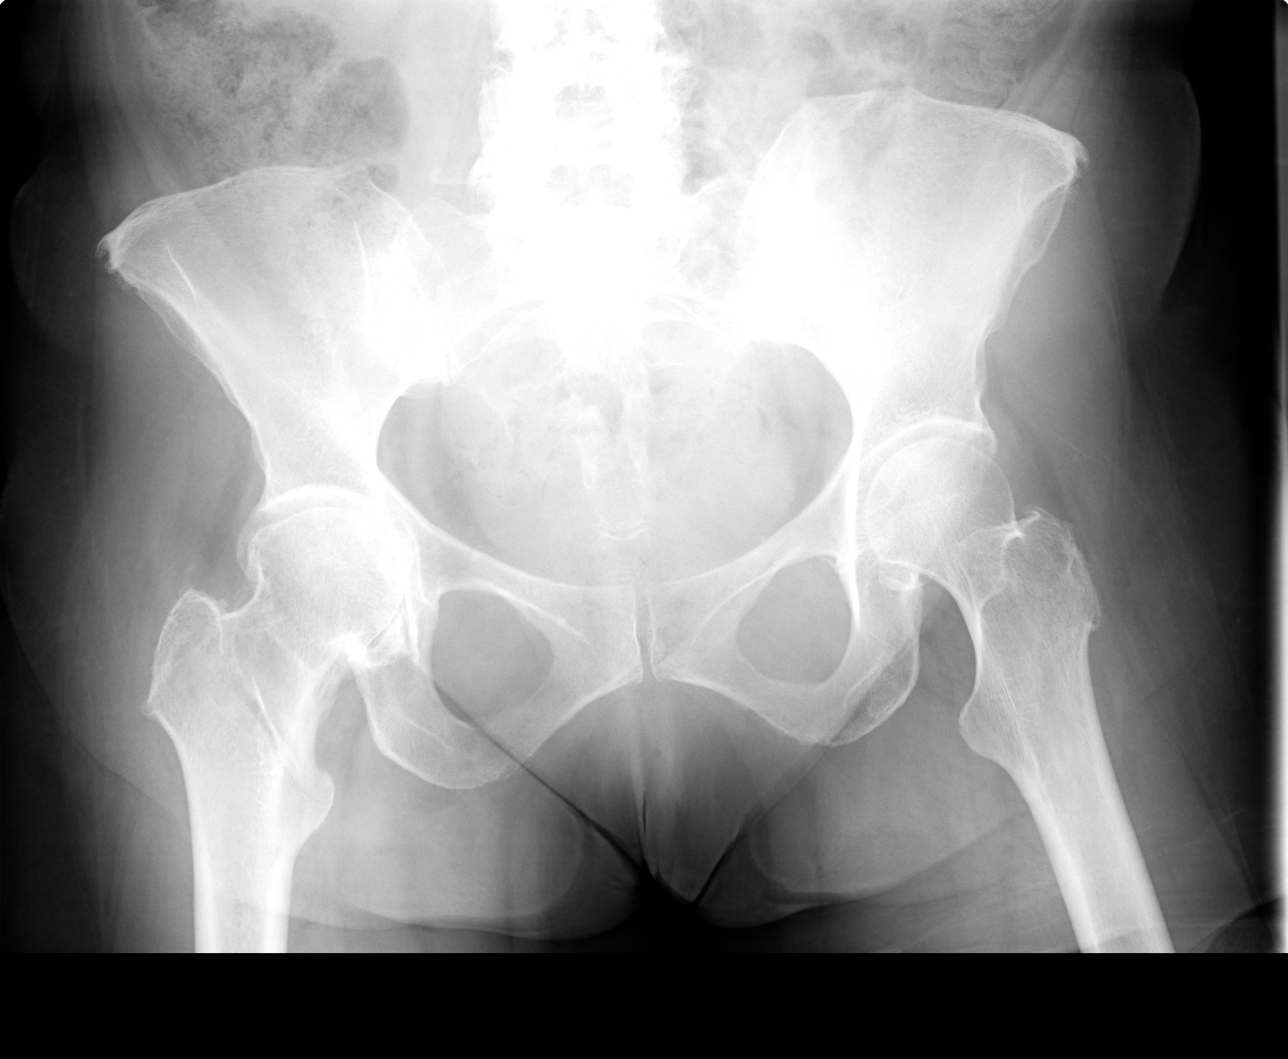

[view not recorded (2 of 3)]
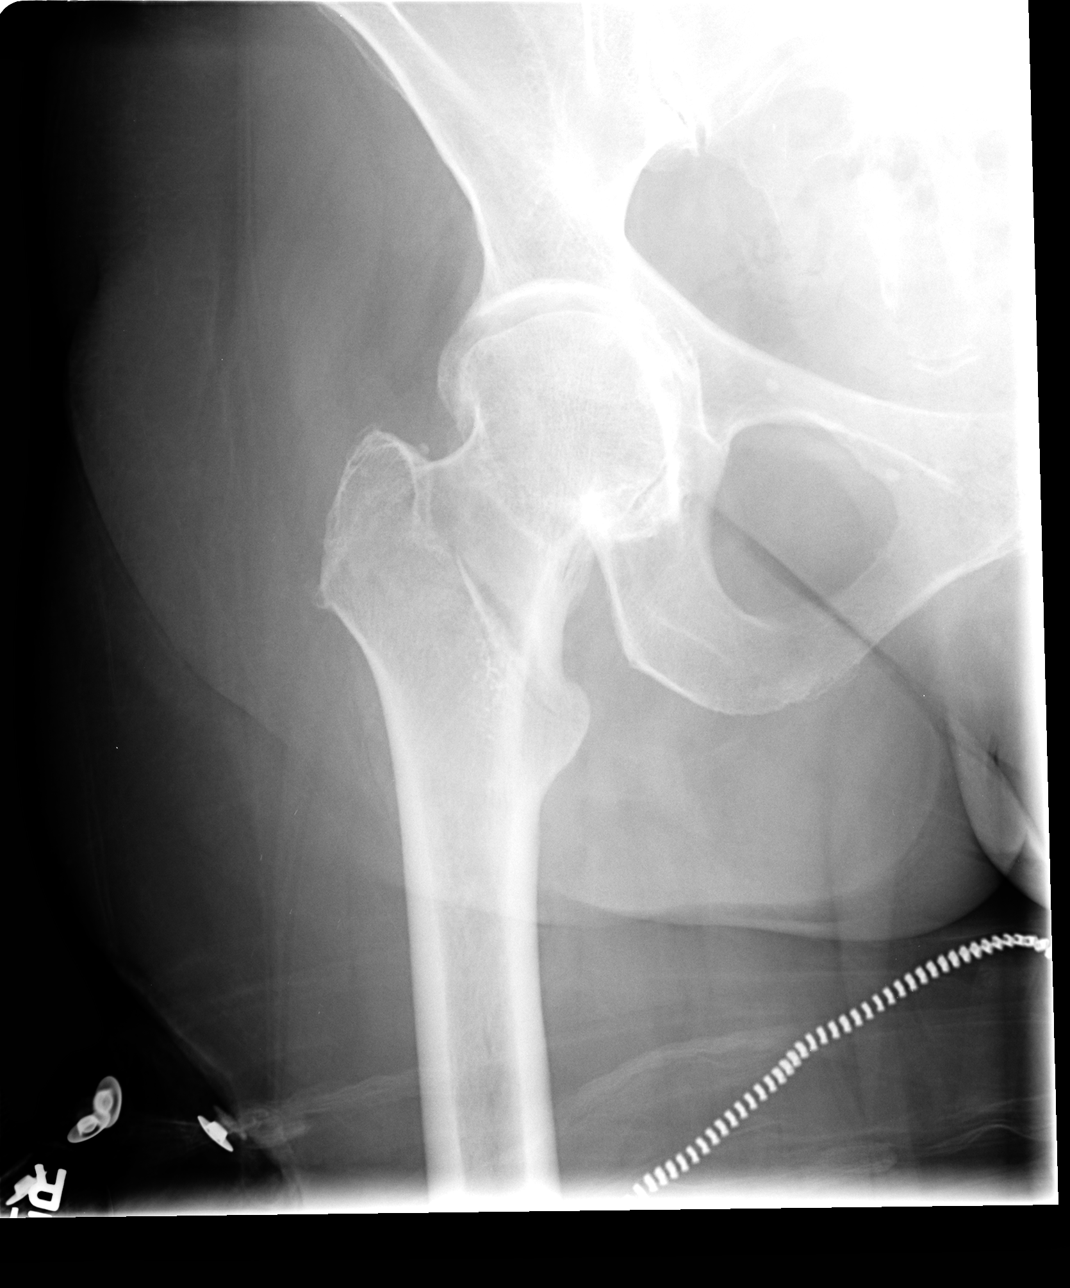

[view not recorded (3 of 3)]
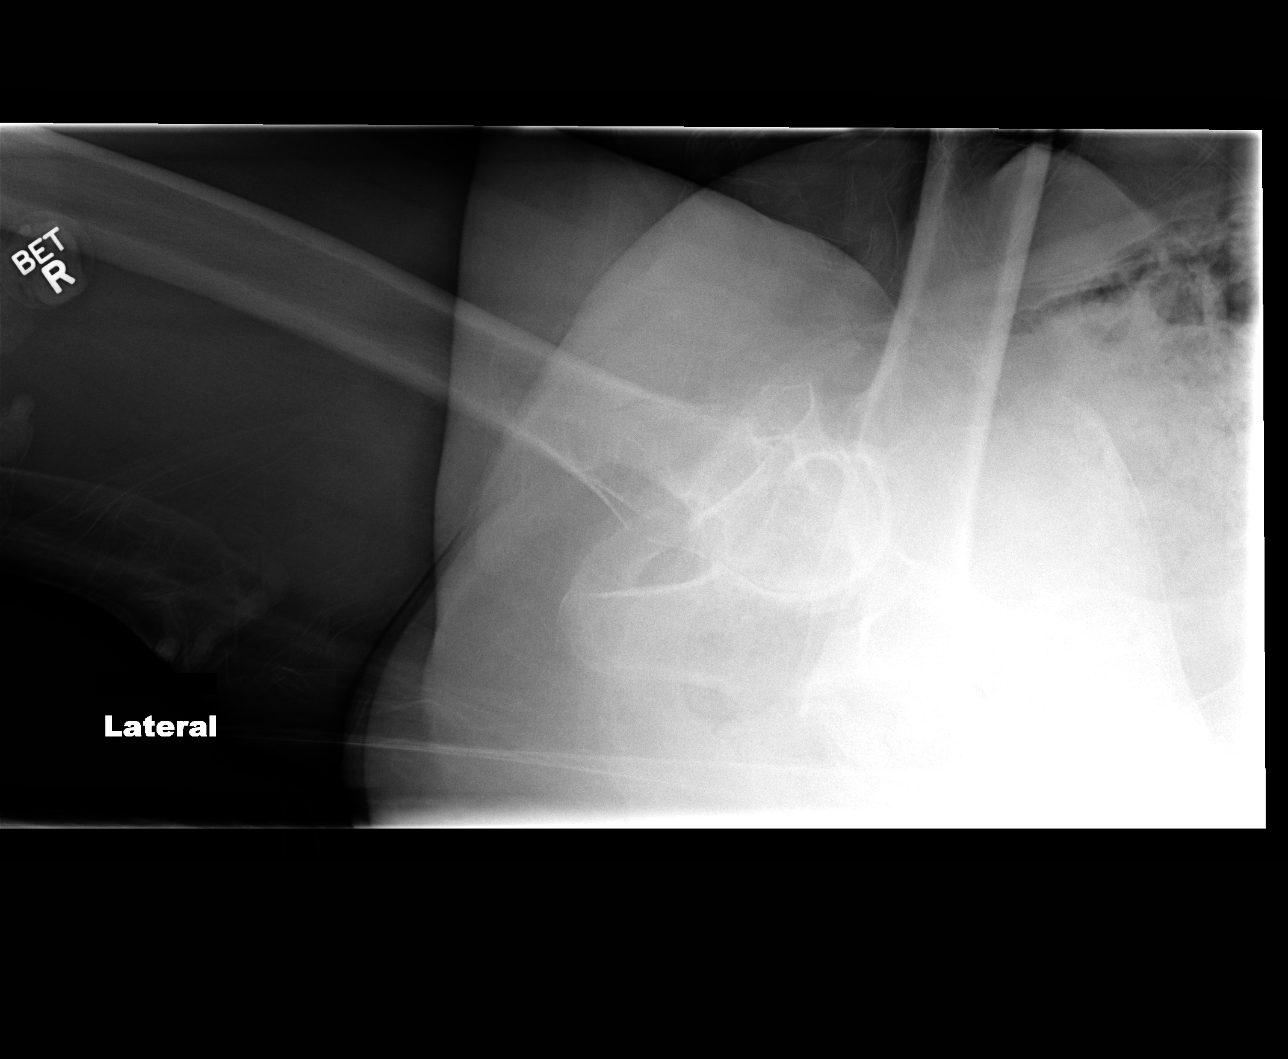

[3 of 3 positions shown; findings below may reference images not displayed]

FINDINGS: Intertrochanteric right femoral neck fracture without
significant displacement.  Severe joint space narrowing in the
right hip with protrusio deformity.

Included [facility] pelvis demonstrates a normal-appearing contralateral
left hip.  Sacroiliac joints intact with mild degenerative changes.
Symphysis pubis intact.  Bone mineral density well preserved.
Degenerative changes involving the visualized lower lumbar spine.
IMPRESSION: Nondisplaced intertrochanteric right femoral neck fracture.  Severe
osteoarthritis in the right hip with protrusio deformity.

## 2014-10-12 IMAGING — RF DG HIP OPERATIVE*R*
1 series · 10 of 10 positions shown · non-contrast
Comparison: Radiograph 12/20/2012.

CLINICAL DATA: Right hip gamma nail placement.

DG OPERATIVE RIGHT HIP

[Series 1: run · 10 of 10 slices shown]
[im 1/10]
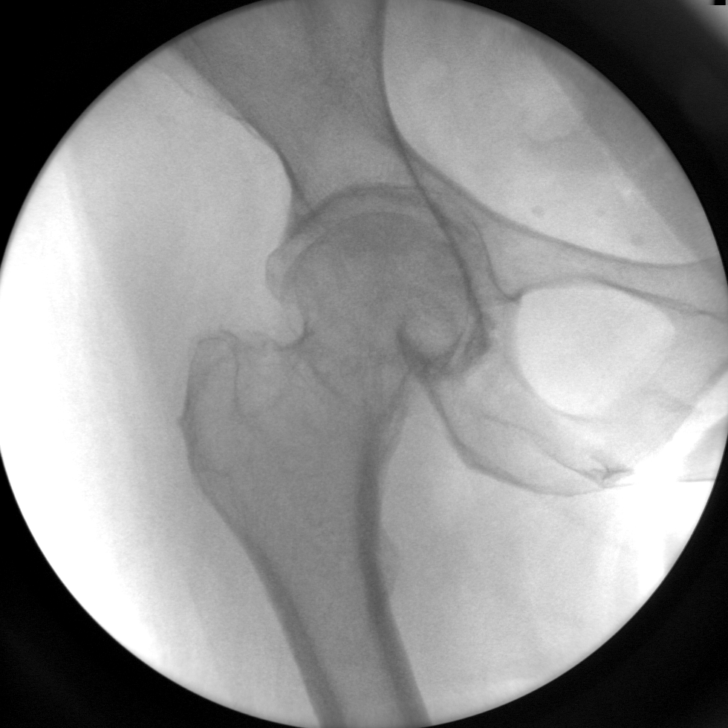
[im 2/10]
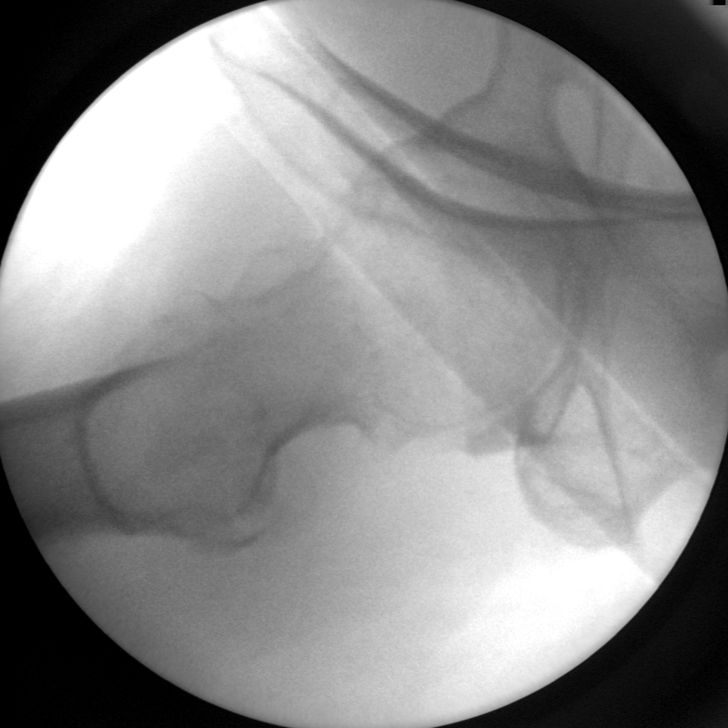
[im 3/10]
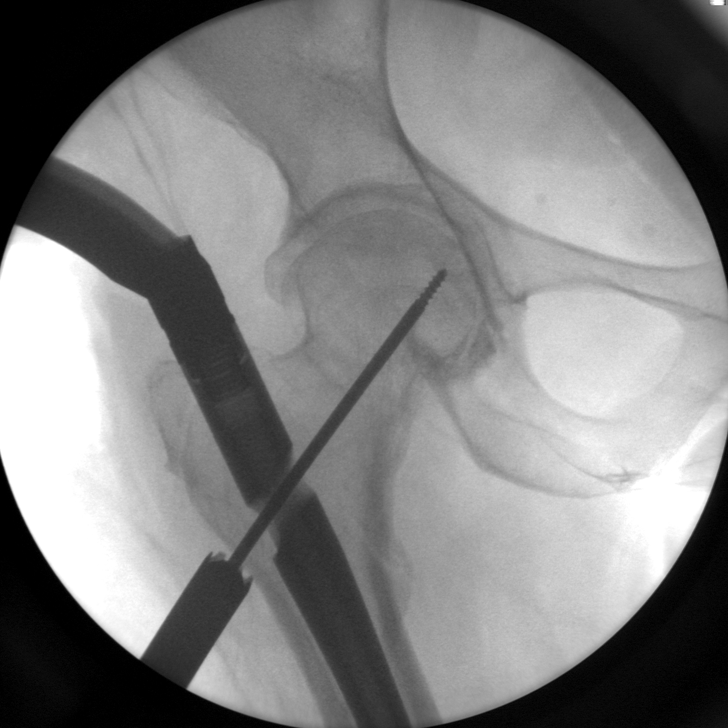
[im 4/10]
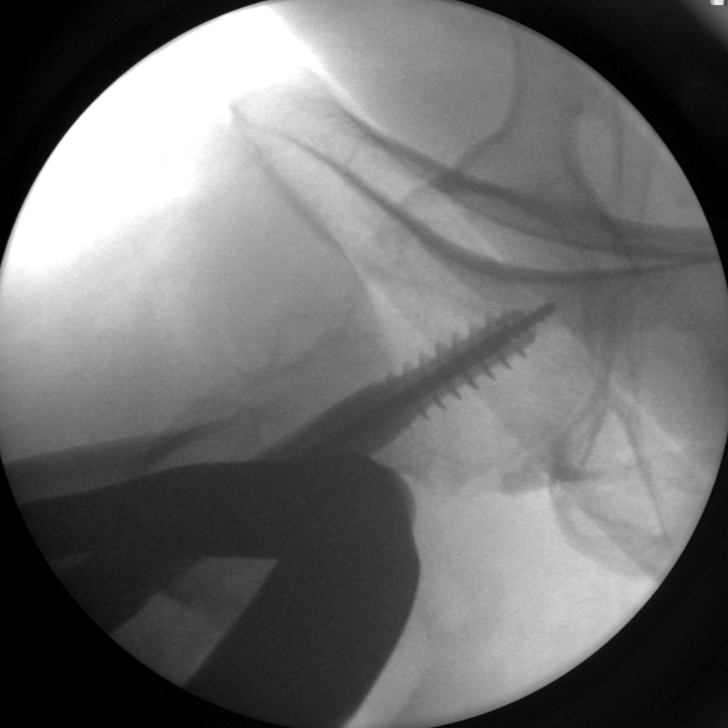
[im 5/10]
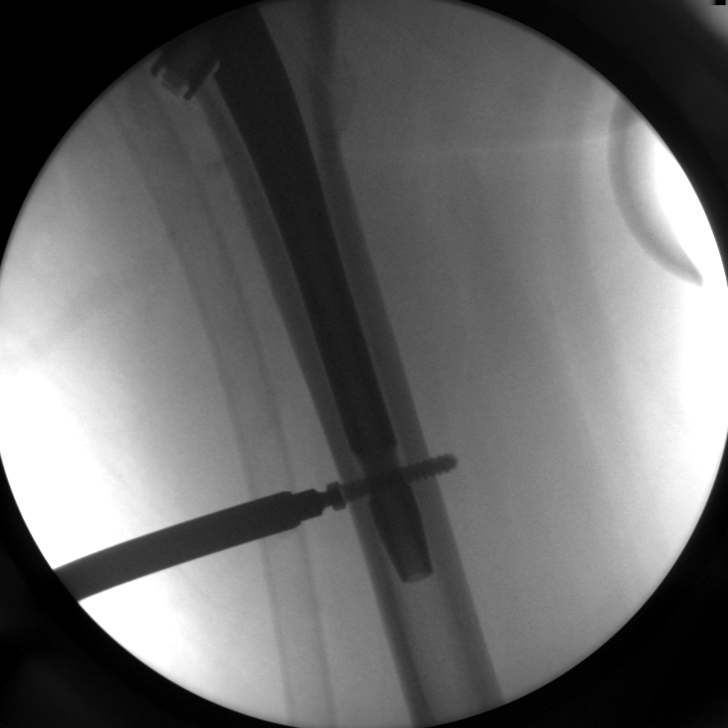
[im 6/10]
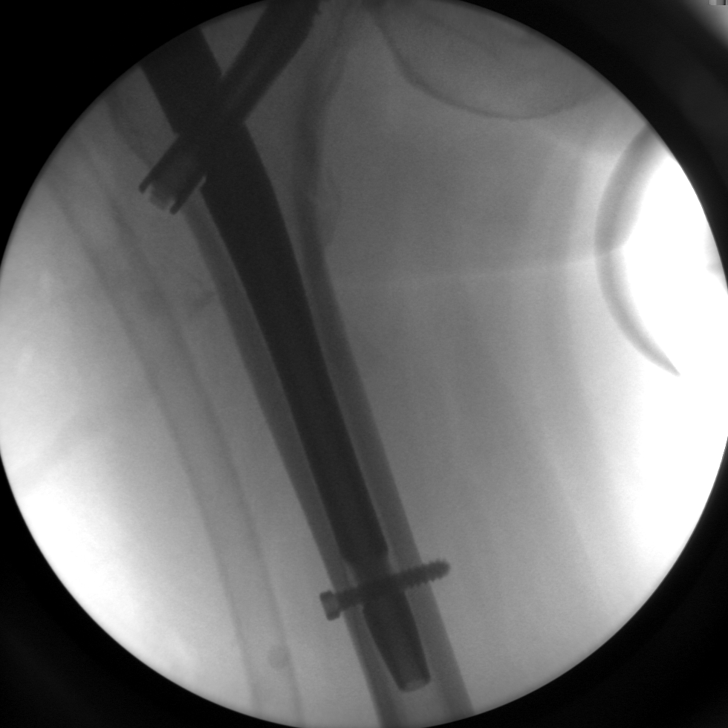
[im 7/10]
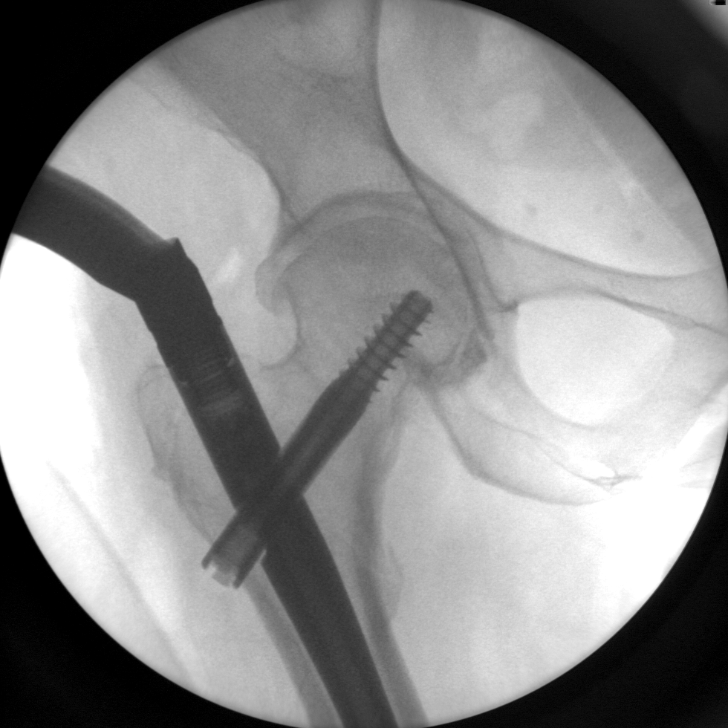
[im 8/10]
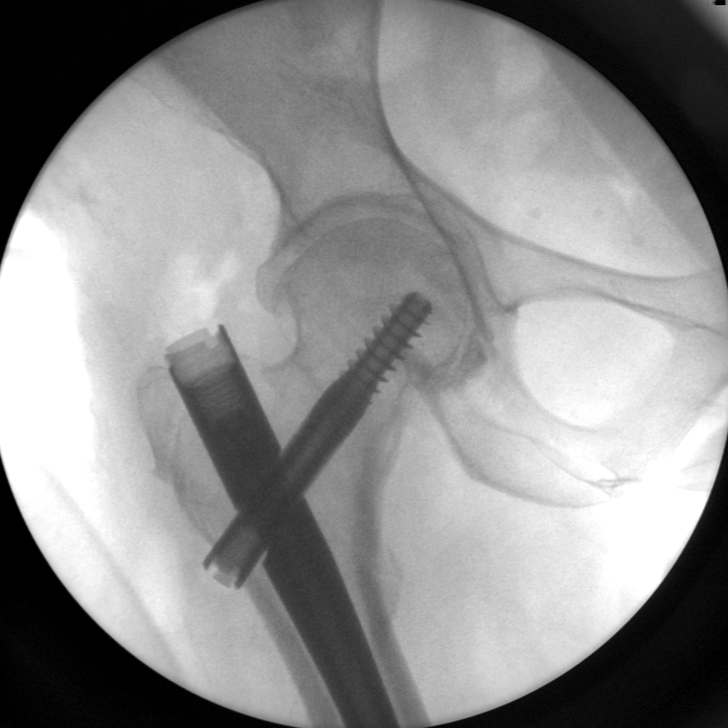
[im 9/10]
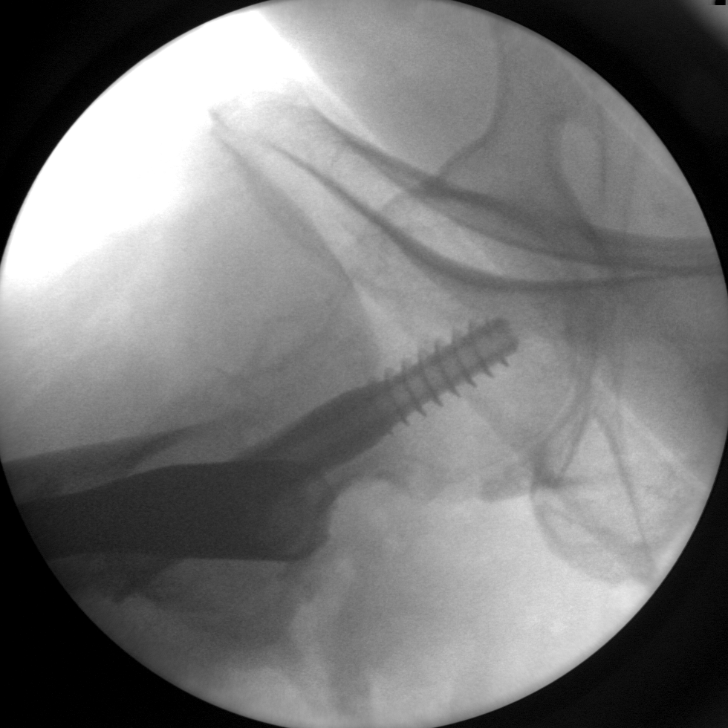
[im 10/10]
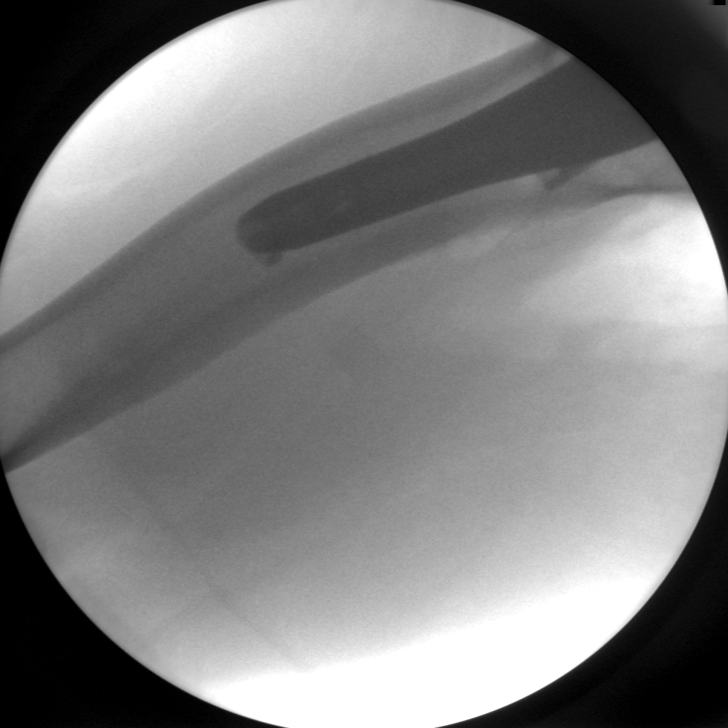

[10 of 10 positions shown; findings below may reference images not displayed]

FINDINGS: Series of 10 intraoperative fluoroscopic spot films
demonstrate placement of a gamma nail with short femoral nail.
IMPRESSION: ORIF of right hip fracture with short gamma nail

## 2014-12-16 ENCOUNTER — Emergency Department (HOSPITAL_COMMUNITY)
Admission: EM | Admit: 2014-12-16 | Discharge: 2014-12-16 | Disposition: A | Discharge status: Home / Self Care | Payer: Medicare Other | Attending: Emergency Medicine | Admitting: Emergency Medicine

## 2014-12-16 ENCOUNTER — Encounter (HOSPITAL_COMMUNITY): Payer: Self-pay | Admitting: *Deleted

## 2014-12-16 ENCOUNTER — Emergency Department (HOSPITAL_COMMUNITY): Payer: Medicare Other

## 2014-12-16 DIAGNOSIS — W19XXXA Unspecified fall, initial encounter: Secondary | ICD-10-CM

## 2014-12-16 DIAGNOSIS — Y998 Other external cause status: Secondary | ICD-10-CM | POA: Diagnosis not present

## 2014-12-16 DIAGNOSIS — Z7982 Long term (current) use of aspirin: Secondary | ICD-10-CM | POA: Diagnosis not present

## 2014-12-16 DIAGNOSIS — Y9389 Activity, other specified: Secondary | ICD-10-CM | POA: Insufficient documentation

## 2014-12-16 DIAGNOSIS — Z79899 Other long term (current) drug therapy: Secondary | ICD-10-CM | POA: Diagnosis not present

## 2014-12-16 DIAGNOSIS — S8992XA Unspecified injury of left lower leg, initial encounter: Secondary | ICD-10-CM | POA: Diagnosis present

## 2014-12-16 DIAGNOSIS — S82032A Displaced transverse fracture of left patella, initial encounter for closed fracture: Secondary | ICD-10-CM | POA: Diagnosis not present

## 2014-12-16 DIAGNOSIS — Z8619 Personal history of other infectious and parasitic diseases: Secondary | ICD-10-CM | POA: Insufficient documentation

## 2014-12-16 DIAGNOSIS — W1839XA Other fall on same level, initial encounter: Secondary | ICD-10-CM | POA: Diagnosis not present

## 2014-12-16 DIAGNOSIS — S82002A Unspecified fracture of left patella, initial encounter for closed fracture: Secondary | ICD-10-CM

## 2014-12-16 DIAGNOSIS — Y9289 Other specified places as the place of occurrence of the external cause: Secondary | ICD-10-CM | POA: Diagnosis not present

## 2014-12-16 HISTORY — DX: Fracture of unspecified part of neck of unspecified femur, initial encounter for closed fracture: S72.009A

## 2014-12-16 HISTORY — DX: Dependence on other enabling machines and devices: Z99.89

## 2014-12-16 MED ORDER — HYDROCODONE-ACETAMINOPHEN 5-325 MG PO TABS: 1.0000 | ORAL_TABLET | Freq: Four times a day (QID) | ORAL | Status: DC | PRN

## 2014-12-16 NOTE — ED Provider Notes (Signed)
CSN: [id number]     Arrival date & time 12/16/14  1606 History  This chart was scribed for No att. providers found by Tonye RoyaltyJoshua Chen, ED Scribe. This patient was seen in room APA05/[facility]05 and the patient's care was started at 5:25 PM.    Chief Complaint  Patient presents with  . Fall   Patient is a 74 y.o. female presenting with fall. The history is provided by the patient. No language interpreter was used.  Fall This is a new problem. The current episode started 1 to 2 hours ago. Episode frequency: once. The problem has not changed since onset.Pertinent negatives include no headaches. The symptoms are aggravated by walking. The symptoms are relieved by rest. She has tried rest for the symptoms. The treatment provided moderate relief.    HPI Comments: Angela [name] is a 74 y.o. female who presents to the Emergency Department complaining of fall just PTA. She states she lost her footing on loose cement and fell, landing on her left knee. She denies striking her head. She denies feeling any pop or twist. She states it is painful to walk and notes associated swelling. She states pain improves with rest and does not have significant pain except when walking. She uses baby ASA every day.   Past Medical History  Diagnosis Date  . Clostridium difficile infection 2010  . Use of cane as ambulatory aid   . Hip fx    Past Surgical History  Procedure Laterality Date  . Skin biopsy      3x 2013  . Tonsillectomy    . Intramedullary (im) nail intertrochanteric Right 12/21/2012    Procedure: INTRAMEDULLARY (IM) NAIL INTERTROCHANTRIC;  Surgeon: Vickki HearingStanley E Harrison, MD;  Location: [facility];  Service: Orthopedics;  Laterality: Right;   History reviewed. No pertinent family history. History  Substance Use Topics  . Smoking status: Never Smoker   . Smokeless tobacco: Not on file  . Alcohol Use: No   OB History    No data available     Review of Systems  Musculoskeletal:       Left knee pain and  swelling  Neurological: Negative for headaches.  All other systems reviewed and are negative.     Allergies  Review of patient's allergies indicates no known allergies.  Home Medications   Prior to Admission medications   Medication Sig Start Date End Date Taking? Authorizing Provider  aspirin 81 MG tablet Take 81 mg by mouth daily.   Yes Historical Provider, MD  CRANBERRY PO Take 1 tablet by mouth at bedtime.    Yes Historical Provider, MD  naproxen sodium (ANAPROX) 220 MG tablet Take 220 mg by mouth 2 (two) times daily as needed.   Yes Historical Provider, MD  bisacodyl (DULCOLAX) 5 MG EC tablet Take 1 tablet (5 mg total) by mouth daily as needed. Patient not taking: Reported on 10/05/2014 12/24/12   Gwenyth BenderKaren M Black, NP  enoxaparin (LOVENOX) 30 MG/0.3ML injection Inject 0.3 mLs (30 mg total) into the skin daily. Patient not taking: Reported on 10/05/2014 12/24/12   Vickki HearingStanley E Harrison, MD  HYDROcodone-acetaminophen [facility](NORCO) 5-325 MG per tablet Take 1 tablet by mouth every 6 (six) hours as needed for severe pain. 12/16/14   Blane OharaJoshua Cael Worth, MD  HYDROcodone-acetaminophen [facility](NORCO) 7.5-325 MG per tablet Take 1 tablet every 4 hours as needed for pain Patient not taking: Reported on 10/05/2014 12/24/12   Man Mast X, NP  HYDROcodone-acetaminophen (NORCO/VICODIN) 5-325 MG per tablet Take 1 tablet by mouth  every 4 (four) hours as needed. Patient not taking: Reported on 10/05/2014 10/02/14   Kristen N Ward, DO   BP 144/77 mmHg  Pulse 68  Temp(Src) 97.8 F (36.6 C) (Oral)  Resp 16  Ht 5' (1.524 m)  Wt 130 lb (58.968 kg)  BMI 25.39 kg/m2  SpO2 98% Physical Exam  Constitutional: She is oriented to person, place, and time. She appears well-developed and well-nourished.  HENT:  Head: Normocephalic and atraumatic.  Eyes: Conjunctivae are normal.  Neck: Normal range of motion. Neck supple.  Pulmonary/Chest: Effort normal.  Musculoskeletal: Normal range of motion.  No tenderness on patella No tibia  tenderness Moderate to large effusion in knee Mild ligament laxity with valgus and varus  Neurological: She is alert and oriented to person, place, and time.  Skin: Skin is warm and dry.  Psychiatric: She has a normal mood and affect.  Nursing note and vitals reviewed.   ED Course  Procedures (including critical care time)  DIAGNOSTIC STUDIES: Oxygen Saturation is 97% on room air, normal by my interpretation.    COORDINATION OF CARE: 5:32 PM Discussed treatment plan with patient at beside, the patient agrees with the plan and has no further questions at this time.   Labs Review Labs Reviewed - No data to display  Imaging Review Dg Knee Complete 4 Views Left  12/16/2014   CLINICAL DATA:  74 year old female with history of left-sided knee pain and swelling after a fall 2 hours ago onto the left knee.  EXAM: LEFT KNEE - COMPLETE 4+ VIEW  COMPARISON:  10/02/2014.  FINDINGS: Transverse nondisplaced fracture of the patella. Large lipomohemarthrosis. No other acute displaced fractures are noted. 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  IMPRESSION: 1. Transverse nondisplaced patellar fracture with large lipohemarthrosis.   Electronically Signed   By: Trudie Reed M.D.   On: 12/16/2014 17:38     EKG Interpretation None      MDM   Final diagnoses:  Patellar fracture, left, closed, initial encounter  Fall, initial encounter   I personally performed the services described in this documentation, which was scribed in my presence. The recorded information has been reviewed and is accurate.  X-ray reviewed by myself showing patellar fracture.  Knee immobilizer and follow-up with orthopedics discussed. Results and differential diagnosis were discussed with the patient/parent/guardian. Close follow up outpatient was discussed, comfortable with the plan.   Medications - No data to display  Filed Vitals:    12/16/14 1612 12/16/14 1824  BP: 137/69 144/77  Pulse: 75 68  Temp: 98.2 F (36.8 C) 97.8 F (36.6 C)  TempSrc: Oral Oral  Resp: 14 16  Height: 5' (1.524 m)   Weight: 130 lb (58.968 kg)   SpO2: 97% 98%    Final diagnoses:  Patellar fracture, left, closed, initial encounter  Fall, initial encounter      Blane Ohara, MD 12/16/14 2209

## 2014-12-16 NOTE — ED Notes (Signed)
Pt states some loose cement caused her fall and landed on left knee, denies hitting her head, states baby ASA everyday

## 2014-12-16 NOTE — Discharge Instructions (Signed)
Non weight bearing. For severe pain take norco or vicodin however realize they have the potential for addiction and it can make you sleepy and has tylenol in it.  No operating machinery while taking.  If you were given medicines take as directed.  If you are on coumadin or contraceptives realize their levels and effectiveness is altered by many different medicines.  If you have any reaction (rash, tongues swelling, other) to the medicines stop taking and see a physician.   Please follow up as directed and return to the ER or see a physician for new or worsening symptoms.  Thank you. Filed Vitals:   12/16/14 1612  BP: 137/69  Pulse: 75  Temp: 98.2 F (36.8 C)  TempSrc: Oral  Resp: 14  Height: 5' (1.524 m)  Weight: 130 lb (58.968 kg)  SpO2: 97%

## 2014-12-16 NOTE — ED Notes (Signed)
Pt refused crutches.

## 2014-12-18 ENCOUNTER — Encounter: Payer: Self-pay | Admitting: Orthopedic Surgery

## 2014-12-18 ENCOUNTER — Ambulatory Visit (INDEPENDENT_AMBULATORY_CARE_PROVIDER_SITE_OTHER): Payer: Medicare Other | Admitting: Orthopedic Surgery

## 2014-12-18 VITALS — BP 136/91 | Ht 60.0 in | Wt 130.0 lb

## 2014-12-18 DIAGNOSIS — S82002A Unspecified fracture of left patella, initial encounter for closed fracture: Secondary | ICD-10-CM

## 2014-12-18 NOTE — Progress Notes (Signed)
NEW PROBLEM   Patient ID: Angela GrainSuzanne F Marcelli, female   DOB: 07-27-1941, 74 y.o.   MRN: [id number]  Chief Complaint  Patient presents with  . Follow-up    ER follow up on left knee, DOI 12-16-14.     Angela [name] is a 74 y.o. female.   HPI 74 year old female fell on March 12 on Saturday slipping in a bathroom landing on her flexed left knee. She went to the ER x-rays show a transverse nondisplaced patella fracture. She was placed in a knee immobilizer but took it off and placed or self in a knee brace that was hinged. She complains of minimal discomfort requiring only ibuprofen for pain and she came in walking with minimal assistive device.  Does have some swelling and decreased range of motion of the knee. Review of Systems Numbness and tingling none. Warmth to the joint mild.  Past Medical History  Diagnosis Date  . Clostridium difficile infection 2010  . Use of cane as ambulatory aid   . Hip fx     Past Surgical History  Procedure Laterality Date  . Skin biopsy      3x 2013  . Tonsillectomy    . Intramedullary (im) nail intertrochanteric Right 12/21/2012    Procedure: INTRAMEDULLARY (IM) NAIL INTERTROCHANTRIC;  Surgeon: Vickki HearingStanley E Harrison, MD;  Location: [facility];  Service: Orthopedics;  Laterality: Right;    History reviewed. No pertinent family history.  Social History History  Substance Use Topics  . Smoking status: Never Smoker   . Smokeless tobacco: Not on file  . Alcohol Use: No    No Known Allergies  Current Outpatient Prescriptions  Medication Sig Dispense Refill  . aspirin 81 MG tablet Take 81 mg by mouth daily.    . bisacodyl (DULCOLAX) 5 MG EC tablet Take 1 tablet (5 mg total) by mouth daily as needed. (Patient not taking: Reported on 10/05/2014) 30 tablet 0  . CRANBERRY PO Take 1 tablet by mouth at bedtime.     . enoxaparin (LOVENOX) 30 MG/0.3ML injection Inject 0.3 mLs (30 mg total) into the skin daily. (Patient not taking: Reported on 10/05/2014) 30  Syringe 0  . HYDROcodone-acetaminophen (NORCO) 5-325 MG per tablet Take 1 tablet by mouth every 6 (six) hours as needed for severe pain. 10 tablet 0  . HYDROcodone-acetaminophen (NORCO) 7.5-325 MG per tablet Take 1 tablet every 4 hours as needed for pain (Patient not taking: Reported on 10/05/2014) 180 tablet 5  . HYDROcodone-acetaminophen (NORCO/VICODIN) 5-325 MG per tablet Take 1 tablet by mouth every 4 (four) hours as needed. (Patient not taking: Reported on 10/05/2014) 15 tablet 0  . naproxen sodium (ANAPROX) 220 MG tablet Take 220 mg by mouth 2 (two) times daily as needed.     No current facility-administered medications for this visit.       Physical Exam Blood pressure 136/91, height 5' (1.524 m), weight 130 lb (58.968 kg). Physical Exam The patient is well developed well nourished and well groomed. Orientation to person place and time is normal  Mood is pleasant. Ambulatory status she is ambulatory with minimal assistance of a cane and a knee brace Medium to large effusion left knee tenderness over the patella but mild she has intact terminal extension and full straight leg raise with minimal extensor lag, knee remains stable, knee range of motion: When I came in the room her knee was flexed about 45 0-45 skin is warm dry and intact no laceration distal pulses are intact sensation is  normal lymph nodes are negative  Data Reviewed Independent interpretation of the hospital x-ray nondisplaced transverse patella fracture  Assessment Encounter Diagnosis  Name Primary?  . Patella fracture, left, closed, initial encounter Yes    Plan She has intact extensor mechanism with straight leg raise and terminal knee extension she was placed in a knee immobilizer follow-up one week x-ray out of the brace and test extension again  If she is able to maintain extension strength and terminal knee extension we can treat her with a brace eventually converting to a hinge T scope type brace when we  think fracture healing has occurred in a significant manner

## 2014-12-19 ENCOUNTER — Other Ambulatory Visit (HOSPITAL_COMMUNITY): Payer: Medicare Other

## 2014-12-20 ENCOUNTER — Ambulatory Visit: Admit: 2014-12-20 | Payer: Self-pay | Admitting: Orthopedic Surgery

## 2014-12-20 SURGERY — OPEN REDUCTION INTERNAL FIXATION (ORIF) PATELLA
Anesthesia: General | Laterality: Left

## 2014-12-26 ENCOUNTER — Ambulatory Visit (INDEPENDENT_AMBULATORY_CARE_PROVIDER_SITE_OTHER): Payer: Medicare Other

## 2014-12-26 ENCOUNTER — Encounter: Payer: Self-pay | Admitting: Orthopedic Surgery

## 2014-12-26 ENCOUNTER — Ambulatory Visit (INDEPENDENT_AMBULATORY_CARE_PROVIDER_SITE_OTHER): Payer: Self-pay | Admitting: Orthopedic Surgery

## 2014-12-26 VITALS — Ht 60.0 in | Wt 130.0 lb

## 2014-12-26 DIAGNOSIS — S82892D Other fracture of left lower leg, subsequent encounter for closed fracture with routine healing: Secondary | ICD-10-CM

## 2014-12-26 NOTE — Patient Instructions (Signed)
Weightbearing as tolerated!

## 2014-12-26 NOTE — Progress Notes (Signed)
Fracture care follow-up  Chief Complaint  Patient presents with  . Follow-up    8 day recheck on left knee for fracture care, DOI 12-16-14.    The patient has a transverse patella fracture with an intact extensor mechanism she is in a straight leg brace  X-rays today show no separation of the fracture  The patient was placed back in a knee immobilizer after demonstrating excellent extension and straight leg raising  Follow-up 3 weeks x-ray and then if things look okay at that point we can place her in a hinged knee brace 0-90

## 2015-01-09 ENCOUNTER — Other Ambulatory Visit (HOSPITAL_COMMUNITY): Payer: Self-pay | Admitting: Internal Medicine

## 2015-01-09 DIAGNOSIS — Z1231 Encounter for screening mammogram for malignant neoplasm of breast: Secondary | ICD-10-CM

## 2015-01-18 ENCOUNTER — Ambulatory Visit: Payer: Medicare Other

## 2015-01-18 ENCOUNTER — Ambulatory Visit (INDEPENDENT_AMBULATORY_CARE_PROVIDER_SITE_OTHER): Payer: Self-pay | Admitting: Orthopedic Surgery

## 2015-01-18 ENCOUNTER — Ambulatory Visit (INDEPENDENT_AMBULATORY_CARE_PROVIDER_SITE_OTHER): Payer: Medicare Other

## 2015-01-18 VITALS — BP 115/67 | Ht 60.0 in | Wt 130.0 lb

## 2015-01-18 DIAGNOSIS — S82892D Other fracture of left lower leg, subsequent encounter for closed fracture with routine healing: Secondary | ICD-10-CM

## 2015-01-18 DIAGNOSIS — S82002D Unspecified fracture of left patella, subsequent encounter for closed fracture with routine healing: Secondary | ICD-10-CM

## 2015-01-18 NOTE — Progress Notes (Signed)
Patient ID: Angela GrainSuzanne F [name], female   DOB: [birth year], 74 y.o.   MRN: [id number] Chief Complaint  Patient presents with  . Follow-up    3 week follow up + xray left knee fx, DOI 12/16/14    Fracture follow-up patella fracture left knee x-ray today shows fracture has no separation slight articular step-off. Patient has 0-90 and 90- 0 range of motion with intact extensor mechanism  Placed in an economy hinged brace weight-bear as tolerated no squatting or heavy lifting follow-up 6 weeks repeat x-ray patella only

## 2015-01-24 ENCOUNTER — Ambulatory Visit (HOSPITAL_COMMUNITY)
Admission: RE | Admit: 2015-01-24 | Discharge: 2015-01-24 | Disposition: A | Discharge status: Home / Self Care | Payer: Medicare Other | Source: Ambulatory Visit | Attending: Internal Medicine | Admitting: Internal Medicine

## 2015-01-24 DIAGNOSIS — Z1231 Encounter for screening mammogram for malignant neoplasm of breast: Secondary | ICD-10-CM | POA: Insufficient documentation

## 2015-03-01 ENCOUNTER — Ambulatory Visit (INDEPENDENT_AMBULATORY_CARE_PROVIDER_SITE_OTHER): Payer: Self-pay | Admitting: Orthopedic Surgery

## 2015-03-01 ENCOUNTER — Ambulatory Visit (INDEPENDENT_AMBULATORY_CARE_PROVIDER_SITE_OTHER): Payer: Medicare Other

## 2015-03-01 VITALS — BP 133/70 | Ht 60.0 in | Wt 130.0 lb

## 2015-03-01 DIAGNOSIS — S82002D Unspecified fracture of left patella, subsequent encounter for closed fracture with routine healing: Secondary | ICD-10-CM | POA: Diagnosis not present

## 2015-03-01 NOTE — Progress Notes (Signed)
Patient ID: Angela GrainSuzanne F [name], female   DOB: 1941/06/29, 74 y.o.   MRN: [id number] Fracture care follow-up . Diagnosis left patella fracture  Treatment closed treatment initially with immobilization then range of motion bracing  This is approximately 3 months since injury.  Patient has no complaints  X-rays show fracture healing  Patient shows greater than 90 of flexion full extension knee is stabl  Patient releasedVS Encounter Diagnosis  Name Primary?  . Patella fracture, left, closed, with routine healing, subsequent encounter Yes   e

## 2015-03-01 NOTE — Addendum Note (Signed)
Addended by: Vickki HearingHARRISON, STANLEY E on: 03/01/2015 10:16 AM   Modules accepted: Medications

## 2015-09-03 ENCOUNTER — Other Ambulatory Visit (HOSPITAL_COMMUNITY): Payer: Self-pay | Admitting: Internal Medicine

## 2015-09-03 ENCOUNTER — Ambulatory Visit (HOSPITAL_COMMUNITY)
Admission: RE | Admit: 2015-09-03 | Discharge: 2015-09-03 | Disposition: A | Discharge status: Home / Self Care | Payer: Medicare Other | Source: Ambulatory Visit | Attending: Internal Medicine | Admitting: Internal Medicine

## 2015-09-03 DIAGNOSIS — M25552 Pain in left hip: Secondary | ICD-10-CM | POA: Insufficient documentation

## 2015-12-18 ENCOUNTER — Other Ambulatory Visit (HOSPITAL_COMMUNITY): Payer: Self-pay | Admitting: Internal Medicine

## 2015-12-18 DIAGNOSIS — Z1231 Encounter for screening mammogram for malignant neoplasm of breast: Secondary | ICD-10-CM

## 2016-01-25 ENCOUNTER — Ambulatory Visit (HOSPITAL_COMMUNITY)
Admission: RE | Admit: 2016-01-25 | Discharge: 2016-01-25 | Disposition: A | Discharge status: Home / Self Care | Payer: Medicare Other | Source: Ambulatory Visit | Attending: Internal Medicine | Admitting: Internal Medicine

## 2016-01-25 DIAGNOSIS — Z1231 Encounter for screening mammogram for malignant neoplasm of breast: Secondary | ICD-10-CM | POA: Diagnosis present

## 2017-01-21 ENCOUNTER — Other Ambulatory Visit (HOSPITAL_COMMUNITY): Payer: Self-pay | Admitting: Internal Medicine

## 2017-01-21 DIAGNOSIS — Z1231 Encounter for screening mammogram for malignant neoplasm of breast: Secondary | ICD-10-CM

## 2017-01-28 ENCOUNTER — Ambulatory Visit (HOSPITAL_COMMUNITY)
Admission: RE | Admit: 2017-01-28 | Discharge: 2017-01-28 | Disposition: A | Discharge status: Home / Self Care | Payer: Medicare Other | Source: Ambulatory Visit | Attending: Internal Medicine | Admitting: Internal Medicine

## 2017-01-28 DIAGNOSIS — Z1231 Encounter for screening mammogram for malignant neoplasm of breast: Secondary | ICD-10-CM | POA: Diagnosis present

## 2017-11-03 ENCOUNTER — Telehealth (HOSPITAL_COMMUNITY): Payer: Self-pay | Admitting: Internal Medicine

## 2017-11-03 NOTE — Telephone Encounter (Signed)
11/03/17  called to reschedule because she now has to attend a funeral at her church

## 2017-11-06 ENCOUNTER — Ambulatory Visit (HOSPITAL_COMMUNITY): Payer: Medicare Other

## 2017-11-06 ENCOUNTER — Encounter (HOSPITAL_COMMUNITY): Payer: Self-pay

## 2017-11-11 ENCOUNTER — Encounter (HOSPITAL_COMMUNITY): Payer: Self-pay | Admitting: Physical Therapy

## 2017-11-11 ENCOUNTER — Ambulatory Visit (HOSPITAL_COMMUNITY): Payer: Medicare Other | Attending: Adult Health Nurse Practitioner | Admitting: Physical Therapy

## 2017-11-11 ENCOUNTER — Other Ambulatory Visit: Payer: Self-pay

## 2017-11-11 DIAGNOSIS — M25551 Pain in right hip: Secondary | ICD-10-CM | POA: Insufficient documentation

## 2017-11-11 DIAGNOSIS — M2560 Stiffness of unspecified joint, not elsewhere classified: Secondary | ICD-10-CM | POA: Diagnosis present

## 2017-11-11 DIAGNOSIS — R2689 Other abnormalities of gait and mobility: Secondary | ICD-10-CM | POA: Diagnosis present

## 2017-11-11 DIAGNOSIS — R29898 Other symptoms and signs involving the musculoskeletal system: Secondary | ICD-10-CM | POA: Diagnosis present

## 2017-11-11 DIAGNOSIS — R531 Weakness: Secondary | ICD-10-CM | POA: Diagnosis present

## 2017-11-11 NOTE — Therapy (Signed)
[facility] [address] [place], [place], [address] Phone: [phone]   Fax:  [phone]  Physical Therapy Evaluation  Patient Details  Name: Angela [name] MRN: [id number] Date of Birth: 1940/12/05 Referring Provider: Roe Rutherford NP   Encounter Date: 11/11/2017  PT End of Session - 11/11/17 1221    Visit Number  1    Number of Visits  17    Date for PT Re-Evaluation  12/09/17    Authorization Type  [employer] Medicare    Authorization Time Period  11/11/17 to 01/06/18    Authorization - Visit Number  1    Authorization - Number of Visits  10    PT Start Time  0905    PT Stop Time  0947    PT Time Calculation (min)  42 min    Activity Tolerance  Patient tolerated treatment well;No increased pain    Behavior During Therapy  WFL for tasks assessed/performed       Past Medical History:  Diagnosis Date  . Clostridium difficile infection 2010  . Hip fx (HCC)   . Use of cane as ambulatory aid     Past Surgical History:  Procedure Laterality Date  . INTRAMEDULLARY (IM) NAIL INTERTROCHANTERIC Right 12/21/2012   Procedure: INTRAMEDULLARY (IM) NAIL INTERTROCHANTRIC;  Surgeon: Vickki Hearing, MD;  Location: [facility];  Service: Orthopedics;  Laterality: Right;  . SKIN BIOPSY     3x 2013  . TONSILLECTOMY      There were no vitals filed for this visit.   Subjective Assessment - 11/11/17 0907    Subjective  Patient stated that five years ago she fell and broke her right hip. She stated that she had a nail and screws put in that are holding it together. Patient reported she was at the [facility] center for 3 weeks after this to receive therapy, but that she did not have any other therapy after that. Patient stated that with the weather she can feel the hardware and it becomes more painful. Patient reported she has no precautions from surgery currently. Patient stated that the biggest difficulty she has is with putting on shoes and socks. Patient  reported that she has 16 stairs at her house and that she has something to hang on to both sides. Patient denied pain currently in right hip and that it can be sore and achey and it "grabs" her every once in a while and feels like it will give way. 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 Patient denied pain currently but reported that her pain reached a 5/10 at one point over the last week in her right hip. Patient denied any tingling or numbness in lower extremities, no changes in bowel and bladder function, and denied changes in her weight.     Pertinent History  Right hip fracture, Surgically fixed with Intramedullary nail, intertrochanteric in 2014    Limitations  Standing;Walking;Sitting;Lifting    How long can you sit comfortably?  10-15 minutes on a hard surface    How long can you stand comfortably?  30 minutes    How long can you walk comfortably?  Difficulty with getting up and getting moving; 30 minutes     Diagnostic tests  Last x-ray: Demonstrating no issues following surgery    Patient Stated Goals  To be able to bend over far enough to the right to put on shoes and socks  Currently in Pain?  No/denies    Multiple Pain Sites  No         [facility] PT Assessment - 11/11/17 0001      Assessment   Medical Diagnosis  Right hip pain    Referring Provider  Roe RutherfordKeatts, Courtney NP    Onset Date/Surgical Date  -- March 2014    Next MD Visit  January 2020    Prior Therapy  3 weeks in the [facility]      Precautions   Precautions  None      Restrictions   Weight Bearing Restrictions  No      Balance Screen   Has the patient fallen in the past 6 months  No    Has the patient had a decrease in activity level because of a fear of falling?   No    Is the patient reluctant to leave their home because of a fear of falling?   No      Prior Function   Level of Independence  Independent      Cognition   Overall Cognitive Status  Within Functional  Limits for tasks assessed      Observation/Other Assessments   Focus on Therapeutic Outcomes (FOTO)   51% (49% limitation)      Sensation   Light Touch  Appears Intact      Squat   Comments  Patient demonstrated increased forward flexion of trunk in order to squat to the ground to pick up an object      Step Up   Comments  --      Step Down   Comments  --      Sit to Stand   Comments  Patient demonstrated need for increased time with sit to stand and slight weight shift toward left lower extremity       AROM   Right/Left Hip  Right;Left    Right Hip Flexion  80    Right Hip External Rotation   --    Right Hip Internal Rotation   --    Left Hip Flexion  91    Left Hip External Rotation   --    Left Hip Internal Rotation   --    Right/Left Knee  --    Lumbar Flexion  WFL    Lumbar Extension  Moderately limited with some pain    Lumbar - Right Rotation  WFL    Lumbar - Left Rotation  WFL      PROM   Right/Left Hip  Right;Left    Right Hip External Rotation   13    Right Hip Internal Rotation   10    Left Hip External Rotation   35    Left Hip Internal Rotation   25      Strength   Right/Left Hip  Right;Left    Right Hip Flexion  4+/5    Right Hip Extension  3-/5    Right Hip ABduction  4+/5    Left Hip Flexion  4+/5    Left Hip Extension  3/5    Left Hip ABduction  4+/5    Right/Left Knee  Right;Left    Right Knee Flexion  5/5    Right Knee Extension  5/5    Left Knee Flexion  5/5    Left Knee Extension  5/5    Right/Left Ankle  Right;Left    Right Ankle Dorsiflexion  5/5    Right Ankle Plantar Flexion  5/5    Left Ankle Dorsiflexion  5/5    Left Ankle Plantar Flexion  5/5      Flexibility   Soft Tissue Assessment /Muscle Length  yes    Hamstrings  No limitation bilaterally      Palpation   Palpation comment  Patient was not tender to palpation around right hip joint anteriorly, laterally, or posteriorly, or on greater trochanter.       Transfers    Five time sit to stand comments   --      Ambulation/Gait   Stairs  Yes    Stairs Assistance  7: Independent    Number of Stairs  16    Gait Comments  Patient was observed ambulating 3x36 feet during session and patient demonstrated decreased stance time on the right lower extremity with this and antalgic, trendelenburg gait, with some lateral trunk leaning.  Patient able to perform step up without upper extremities ascending 4, 6 inch stairs x 4 ; Noted decreased knee flexion bilaterally with this. Patient performed without upper extremity support descended 4, 6 inch stairs x 4 times, noted slight decreased eccentric quadricep control with this.      Static Standing Balance   Static Standing - Balance Support  No upper extremity supported    Static Standing - Level of Assistance  7: Independent    Static Standing Balance -  Activities   Single Leg Stance - Right Leg;Single Leg Stance - Left Leg;Tandam Stance - Right Leg;Tandam Stance - Left Leg    Static Standing - Comment/# of Minutes  Patient performed SLS on the right lower extremity for 3 seconds; and SLS on the left for 10 seconds; tandem stance with each lower extremity for 20 seconds       Standardized Balance Assessment   Five times sit to stand comments   Patient performed in 13 seconds with visible weight shift to the left lower extremity      Timed Up and Go Test   Normal TUG (seconds)  10    TUG Comments  Patient demonstrated ability to perform TUG without use of assistive device             Objective measurements completed on examination: See above findings.              PT Education - 11/11/17 [phone]    Education provided  Yes    Education Details  Patient was educated about examination findings and plan of care.     Person(s) Educated  Patient    Methods  Explanation    Comprehension  Verbalized understanding       PT Short Term Goals - 11/11/17 1224      PT SHORT TERM GOAL #1   Title  Patient will  demonstrate understanding and report regular compliance with HEP in order to improve patient's functional mobility.     Time  4    Period  Weeks    Status  New    Target Date  12/09/17      PT SHORT TERM GOAL #2   Title  Patient will demonstrate improved hip extension, abduction, and flexion strength of 1/2 MMT grade bilaterally as evidence of improved hip strength in order to assist with performing functional mobility.     Baseline  Hip flexion strength 4+/5 bilaterally, hip abduction strength 4+/5 bilaterally, and hip extension of 3-/5 on the right and 3/5 on the right.    Time  4  Period  Weeks    Status  New    Target Date  12/09/17      PT SHORT TERM GOAL #3   Title  Patient will demonstrate ability to maintain single limb stance on the right lower extremity for 5 seconds as evidence of improved balance and improved ability to perform functional activities requiring single limb stance such as stair navigation.     Baseline  Patient maintained single limb stance for 3 seconds.     Time  4    Period  Weeks    Status  New    Target Date  12/09/17        PT Long Term Goals - 11/11/17 1248      PT LONG TERM GOAL #1   Title  Patient will demonstrate an improvement on FOTO of 10% as evidence of patient's perceived improvement in functional mobility.     Baseline  Patient's score was 51% or 49% limited.     Time  8    Period  Weeks    Status  New    Target Date  01/06/18      PT LONG TERM GOAL #2   Title  Patient will perform 5x sit to stand in 12 seconds or less and without visible weight shift to the left lower extremity indicating improvement in ability to perform transitions and decrease risk of falling.     Baseline  Patient performed 5x sit to stand in 13 seconds this session with weight slightly shifted to the left lower extremity.     Time  8    Period  Weeks    Status  New    Target Date  01/06/18      PT LONG TERM GOAL #3   Title  Patient will demonstrate  improvement in MMT of 1 grade for hip extension in bilateral hips as evidence of improved hip extension strength to assist performance of gait and other functional activities.     Baseline  Right hip extension 3-/5 and left hip extension 3/5.     Time  8    Period  Weeks    Status  New    Target Date  01/06/18      PT LONG TERM GOAL #4   Title  Patient will report no greater than a 2/10 pain in right hip over the course of 1 week period indicating better tolerance of functional activities.     Baseline  Patient reported a 5/10 pain within the course of the last week.     Time  8    Period  Weeks    Status  New    Target Date  01/06/18             Plan - 11/11/17 1535    Clinical Impression Statement  Patient is a pleasant 77 year-old female who presented to physical therapy today with complaints of approximately five years of right hip pain after having a right hip fracture which was surgically fixed with intertrochanteric intramedullary nail in 2014. Patient demonstrated decreased hip active range of motion and hip passive range of motion as described in objective findings. Patient also demonstrated decreased lower extremity strength in bilaterally in hip flexion, hip extension, and hip abduction. Patient performed 5x sit to stand in 13 seconds this session with noted weight shift to the left lower extremity indicating a slightly increased risk of falls. Patient performed TUG within a normal range for her age. Patient demonstrated several  gait deviations during session with noted trunk compensation, antalgic gait, and decreased stance time on the right lower extremity. Patient performed squat to stand with increased trunk flexion rather than lower extremity bending this session. Patient performed stair ambulation without use of upper extremities, but noted decreased knee flexion with ascending stairs, and decreased eccentric control with descending stairs. Patient demonstrated decreased  balance this session more notably on the right lower extremity as she performed single limb stance on the left for 10 seconds, and only for 3 seconds on the right lower extremity. In addition, patient's FOTO score was found to be 51% indicating 49% limitation currently. Patient would benefit from skilled physical therapy in order to address patient's deficits in range of motion, strength, balance, pain, gait, and overall functional mobility.     History and Personal Factors relevant to plan of care:  IM rod to repair right hip fracture in 2014    Clinical Presentation  Stable    Clinical Presentation due to:  FOTO, ROM, MMT, TUG, 5x STS, and clinical judgement    Clinical Decision Making  Low    Rehab Potential  Good    Clinical Impairments Affecting Rehab Potential  Positive: Patient's positive attitude, patient's motivation. Negative: Chronicity of current problem     PT Frequency  2x / week    PT Duration  8 weeks    PT Treatment/Interventions  ADLs/Self Care Home Management;Aquatic Therapy;Cryotherapy;Electrical Stimulation;Moist Heat;Ultrasound;Gait training;DME Instruction;Stair training;Functional mobility training;Therapeutic activities;Therapeutic exercise;Balance training;Neuromuscular re-education;Patient/family education;Manual techniques;Passive range of motion    PT Next Visit Plan  Initiate HEP: Including single limb stance at a support surface, sidelying hip abduction, and mini-squats; Review Evaluation; perform DGI; Initiate lower extremity strengthening for hip abductors, extensors, and flexors, include functional strengthening     PT Home Exercise Plan  Initiate next session    Consulted and Agree with Plan of Care  Patient       Patient will benefit from skilled therapeutic intervention in order to improve the following deficits and impairments:  Abnormal gait, Decreased balance, Decreased mobility, Decreased endurance, Difficulty walking, Decreased range of motion, Improper body  mechanics, Decreased activity tolerance, Decreased strength, Pain  Visit Diagnosis: Pain in right hip  Balance problems  Decreased range of motion  Decreased strength  Other symptoms and signs involving the musculoskeletal system  Other abnormalities of gait and mobility     Problem List Patient Active Problem List   Diagnosis Date Noted  . Effusion of knee joint 10/05/2014  . Acute blood loss anemia 12/22/2012  . Closed hip fracture (HCC) 12/21/2012  . Intertrochanteric fracture of right hip (HCC) 12/21/2012    Verne Carrow 11/11/2017, 4:15 PM  [facility] [address] [place], [place], [address] Phone: [phone]   Fax:  [phone]  Name: Angela [name] MRN: [id number] Date of Birth: Nov 29, 1940

## 2017-11-13 ENCOUNTER — Encounter (HOSPITAL_COMMUNITY): Payer: Self-pay

## 2017-11-13 ENCOUNTER — Ambulatory Visit (HOSPITAL_COMMUNITY): Payer: Medicare Other

## 2017-11-13 DIAGNOSIS — M25551 Pain in right hip: Secondary | ICD-10-CM | POA: Diagnosis not present

## 2017-11-13 DIAGNOSIS — R29898 Other symptoms and signs involving the musculoskeletal system: Secondary | ICD-10-CM

## 2017-11-13 DIAGNOSIS — R2689 Other abnormalities of gait and mobility: Secondary | ICD-10-CM

## 2017-11-13 DIAGNOSIS — R531 Weakness: Secondary | ICD-10-CM

## 2017-11-13 DIAGNOSIS — M2560 Stiffness of unspecified joint, not elsewhere classified: Secondary | ICD-10-CM

## 2017-11-13 NOTE — Therapy (Signed)
[facility] [address] [place], [place], [address] Phone: [phone]   Fax:  [phone]  Physical Therapy Treatment  Patient Details  Name: Angela [name] MRN: [id number] Date of Birth: [birth year] Referring Provider: Roe Rutherford NP   Encounter Date: 11/13/2017  PT End of Session - 11/13/17 0910    Visit Number  2    Number of Visits  17    Date for PT Re-Evaluation  12/09/17    Authorization Type  [employer] Medicare    Authorization Time Period  11/11/17 to 01/06/18    Authorization - Visit Number  2    Authorization - Number of Visits  10    PT Start Time  0904    PT Stop Time  0944    PT Time Calculation (min)  40 min    Activity Tolerance  Patient tolerated treatment well;No increased pain    Behavior During Therapy  WFL for tasks assessed/performed       Past Medical History:  Diagnosis Date  . Clostridium difficile infection 2010  . Hip fx (HCC)   . Use of cane as ambulatory aid     Past Surgical History:  Procedure Laterality Date  . INTRAMEDULLARY (IM) NAIL INTERTROCHANTERIC Right 12/21/2012   Procedure: INTRAMEDULLARY (IM) NAIL INTERTROCHANTRIC;  Surgeon: Vickki Hearing, MD;  Location: [facility];  Service: Orthopedics;  Laterality: Right;  . SKIN BIOPSY     3x 2013  . TONSILLECTOMY      There were no vitals filed for this visit.  Subjective Assessment - 11/13/17 0906    Subjective  Pt stated her hip feels better following initial eval, feels her gait mechanics improved with dog walking.  Rt hip pain scale currently at 2/10    Pertinent History  Right hip fracture, Surgically fixed with Intramedullary nail, intertrochanteric in 2014    Patient Stated Goals  To be able to bend over far enough to the right to put on shoes and socks    Currently in Pain?  Yes    Pain Score  2     Pain Location  Hip    Pain Orientation  Right    Pain Descriptors / Indicators  -- catching    Pain Type  Chronic pain    Pain Onset   More than a month ago    Pain Frequency  Intermittent    Aggravating Factors   sitting or standing for too long    Pain Relieving Factors  aleve    Effect of Pain on Daily Activities  works through the pain         [facility] PT Assessment - 11/13/17 0001      Assessment   Medical Diagnosis  Right hip pain    Referring Provider  Roe Rutherford NP    Onset Date/Surgical Date  -- March 2014    Next MD Visit  January 2020    Prior Therapy  3 weeks in the [facility]      Precautions   Precautions  None      Standardized Balance Assessment   Standardized Balance Assessment  Dynamic Gait Index      Dynamic Gait Index   Level Surface  Normal    Change in Gait Speed  Mild Impairment    Gait with Horizontal Head Turns  Mild Impairment    Gait with Vertical Head Turns  Mild Impairment    Gait and Pivot Turn  Normal  Step Over Obstacle  Normal    Step Around Obstacles  Normal    Steps  Normal    Total Score  21                  [facility] Adult PT Treatment/Exercise - 11/13/17 0001      Exercises   Exercises  Knee/Hip      Knee/Hip Exercises: Standing   Functional Squat  10 reps    Functional Squat Limitations  minisquat, cueing to improve mechanics    SLS  Lt 9":, Rt 26" max of 3      Knee/Hip Exercises: Supine   Bridges  10 reps      Knee/Hip Exercises: Sidelying   Hip ABduction  10 reps             PT Education - 11/13/17 0912    Education provided  Yes    Education Details  Reviewed goals, established HEP and copy of eval given to pt    Person(s) Educated  Patient    Methods  Explanation;Demonstration;Handout    Comprehension  Verbalized understanding;Returned demonstration       PT Short Term Goals - 11/11/17 1224      PT SHORT TERM GOAL #1   Title  Patient will demonstrate understanding and report regular compliance with HEP in order to improve patient's functional mobility.     Time  4    Period  Weeks    Status  New    Target Date   12/09/17      PT SHORT TERM GOAL #2   Title  Patient will demonstrate improved hip extension, abduction, and flexion strength of 1/2 MMT grade bilaterally as evidence of improved hip strength in order to assist with performing functional mobility.     Baseline  Hip flexion strength 4+/5 bilaterally, hip abduction strength 4+/5 bilaterally, and hip extension of 3-/5 on the right and 3/5 on the right.    Time  4    Period  Weeks    Status  New    Target Date  12/09/17      PT SHORT TERM GOAL #3   Title  Patient will demonstrate ability to maintain single limb stance on the right lower extremity for 5 seconds as evidence of improved balance and improved ability to perform functional activities requiring single limb stance such as stair navigation.     Baseline  Patient maintained single limb stance for 3 seconds.     Time  4    Period  Weeks    Status  New    Target Date  12/09/17        PT Long Term Goals - 11/11/17 1248      PT LONG TERM GOAL #1   Title  Patient will demonstrate an improvement on FOTO of 10% as evidence of patient's perceived improvement in functional mobility.     Baseline  Patient's score was 51% or 49% limited.     Time  8    Period  Weeks    Status  New    Target Date  01/06/18      PT LONG TERM GOAL #2   Title  Patient will perform 5x sit to stand in 12 seconds or less and without visible weight shift to the left lower extremity indicating improvement in ability to perform transitions and decrease risk of falling.     Baseline  Patient performed 5x sit to stand in 13 seconds this session  with weight slightly shifted to the left lower extremity.     Time  8    Period  Weeks    Status  New    Target Date  01/06/18      PT LONG TERM GOAL #3   Title  Patient will demonstrate improvement in MMT of 1 grade for hip extension in bilateral hips as evidence of improved hip extension strength to assist performance of gait and other functional activities.      Baseline  Right hip extension 3-/5 and left hip extension 3/5.     Time  8    Period  Weeks    Status  New    Target Date  01/06/18      PT LONG TERM GOAL #4   Title  Patient will report no greater than a 2/10 pain in right hip over the course of 1 week period indicating better tolerance of functional activities.     Baseline  Patient reported a 5/10 pain within the course of the last week.     Time  8    Period  Weeks    Status  New    Target Date  01/06/18            Plan - 11/13/17 1444    Clinical Impression Statement  Reviewed goals and copy of eval given to pt.  Session foucs with gluteal strengthening and establishing HEP.  Pt able to complete all exercises with minimal cueing for form and technqiues with new exercises.  Pt able to demonstrate and verbalize all exercises given as HEP.  DGI complete with score 21/24, pt with main deficits with decreased speed and antalgic gait mechanics with head turns and cadence speed changes.  No reports of pain through session.      Rehab Potential  Good    Clinical Impairments Affecting Rehab Potential  Positive: Patient's positive attitude, patient's motivation. Negative: Chronicity of current problem     PT Frequency  2x / week    PT Duration  8 weeks    PT Treatment/Interventions  ADLs/Self Care Home Management;Aquatic Therapy;Cryotherapy;Electrical Stimulation;Moist Heat;Ultrasound;Gait training;DME Instruction;Stair training;Functional mobility training;Therapeutic activities;Therapeutic exercise;Balance training;Neuromuscular re-education;Patient/family education;Manual techniques;Passive range of motion    PT Next Visit Plan  Initiate lower extremity strengthening for hip abductors, extensors, and flexors, include functional strengthening     PT Home Exercise Plan  11/13/2017: SLS, bridge, abduction and mini-squats       Patient will benefit from skilled therapeutic intervention in order to improve the following deficits and  impairments:  Abnormal gait, Decreased balance, Decreased mobility, Decreased endurance, Difficulty walking, Decreased range of motion, Improper body mechanics, Decreased activity tolerance, Decreased strength, Pain  Visit Diagnosis: Pain in right hip  Balance problems  Decreased range of motion  Decreased strength  Other symptoms and signs involving the musculoskeletal system  Other abnormalities of gait and mobility     Problem List Patient Active Problem List   Diagnosis Date Noted  . Effusion of knee joint 10/05/2014  . Acute blood loss anemia 12/22/2012  . Closed hip fracture (HCC) 12/21/2012  . Intertrochanteric fracture of right hip [facility]) 12/21/2012   Becky Sax, LPTA; CBIS [phone]  Juel Burrow 11/13/2017, 2:51 PM  [facility] [address] [place], [place], [address] Phone: [phone]   Fax:  [phone]  Name: Angela [name] MRN: [id number] Date of Birth: 24-Aug-1941

## 2017-11-13 NOTE — Patient Instructions (Signed)
Abduction    Lift leg up toward ceiling. Return.  Repeat 10 times each leg. Do 1-2 sessions per day.  http://[email or web address].[email or web address]   Bridge    Lie back, legs bent. Inhale, pressing hips up. Keeping ribs in, lengthen lower back. Exhale, rolling down along spine from top. Repeat 10 times. Do 1-2 sessions per day.  http://pm.exer.us/55   Copyright  VHI. All rights reserved.    FUNCTIONAL MOBILITY: Squat    Stance: shoulder-width on floor. Bend hips and knees. Keep back straight. Do not allow knees to bend past toes. Squeeze glutes and quads to stand. 10 reps per set, 1-2 sets per day, 4 days per week  Copyright  VHI. All rights reserved.   Single Leg Balance: Eyes Open    Stand on right leg with eyes open. Hold 30 seconds. 3 reps 2 times per day.  http://ggbe.exer.us/5   Copyright  VHI. All rights reserved.

## 2017-11-17 ENCOUNTER — Ambulatory Visit (HOSPITAL_COMMUNITY): Payer: Medicare Other | Admitting: Physical Therapy

## 2017-11-17 DIAGNOSIS — M25551 Pain in right hip: Secondary | ICD-10-CM

## 2017-11-17 DIAGNOSIS — R2689 Other abnormalities of gait and mobility: Secondary | ICD-10-CM

## 2017-11-17 DIAGNOSIS — M2560 Stiffness of unspecified joint, not elsewhere classified: Secondary | ICD-10-CM

## 2017-11-17 NOTE — Therapy (Signed)
[facility] [address] [place], [place], [address] Phone: [phone]   Fax:  [phone]  Physical Therapy Treatment  Patient Details  Name: Angela [name] MRN: [id number] Date of Birth: Apr 11, 1941 Referring Provider: Roe Rutherford NP   Encounter Date: 11/17/2017  PT End of Session - 11/17/17 1027    Visit Number  3    Number of Visits  17    Date for PT Re-Evaluation  12/09/17    Authorization Type  [employer] Medicare    Authorization Time Period  11/11/17 to 01/06/18    Authorization - Visit Number  3    Authorization - Number of Visits  10    PT Start Time  0900    PT Stop Time  0945    PT Time Calculation (min)  45 min    Activity Tolerance  Patient tolerated treatment well;No increased pain    Behavior During Therapy  WFL for tasks assessed/performed       Past Medical History:  Diagnosis Date  . Clostridium difficile infection 2010  . Hip fx (HCC)   . Use of cane as ambulatory aid     Past Surgical History:  Procedure Laterality Date  . INTRAMEDULLARY (IM) NAIL INTERTROCHANTERIC Right 12/21/2012   Procedure: INTRAMEDULLARY (IM) NAIL INTERTROCHANTRIC;  Surgeon: Vickki Hearing, MD;  Location: [facility];  Service: Orthopedics;  Laterality: Right;  . SKIN BIOPSY     3x 2013  . TONSILLECTOMY      There were no vitals filed for this visit.  Subjective Assessment - 11/17/17 0908    Subjective  Pt states her Rt hip wakes her up at night and currently without pain unless doing exercises or moving it a certain way.      Currently in Pain?  No/denies                      [facility] Adult PT Treatment/Exercise - 11/17/17 0001      Knee/Hip Exercises: Standing   Heel Raises  Both;10 reps    Hip Flexion  Both;Limitations    Hip Flexion Limitations  marching alternating with 3" holds    Forward Lunges  Both;10 reps    Hip Abduction  Both;10 reps    Hip Extension  Both;10 reps    Functional Squat  10 reps    Functional Squat Limitations  minisquat, cueing to improve mechanics    SLS  Lt 7":, Rt 30" max of 3    SLS with Vectors  5X5" each with 1 UE assist      Knee/Hip Exercises: Supine   Bridges  10 reps;Limitations    Bridges Limitations  HEP review      Knee/Hip Exercises: Sidelying   Hip ABduction  10 reps;Limitations    Hip ABduction Limitations  HEP reveiw               PT Short Term Goals - 11/11/17 1224      PT SHORT TERM GOAL #1   Title  Patient will demonstrate understanding and report regular compliance with HEP in order to improve patient's functional mobility.     Time  4    Period  Weeks    Status  New    Target Date  12/09/17      PT SHORT TERM GOAL #2   Title  Patient will demonstrate improved hip extension, abduction, and flexion strength of 1/2 MMT grade bilaterally as evidence of improved hip  strength in order to assist with performing functional mobility.     Baseline  Hip flexion strength 4+/5 bilaterally, hip abduction strength 4+/5 bilaterally, and hip extension of 3-/5 on the right and 3/5 on the right.    Time  4    Period  Weeks    Status  New    Target Date  12/09/17      PT SHORT TERM GOAL #3   Title  Patient will demonstrate ability to maintain single limb stance on the right lower extremity for 5 seconds as evidence of improved balance and improved ability to perform functional activities requiring single limb stance such as stair navigation.     Baseline  Patient maintained single limb stance for 3 seconds.     Time  4    Period  Weeks    Status  New    Target Date  12/09/17        PT Long Term Goals - 11/11/17 1248      PT LONG TERM GOAL #1   Title  Patient will demonstrate an improvement on FOTO of 10% as evidence of patient's perceived improvement in functional mobility.     Baseline  Patient's score was 51% or 49% limited.     Time  8    Period  Weeks    Status  New    Target Date  01/06/18      PT LONG TERM GOAL #2   Title   Patient will perform 5x sit to stand in 12 seconds or less and without visible weight shift to the left lower extremity indicating improvement in ability to perform transitions and decrease risk of falling.     Baseline  Patient performed 5x sit to stand in 13 seconds this session with weight slightly shifted to the left lower extremity.     Time  8    Period  Weeks    Status  New    Target Date  01/06/18      PT LONG TERM GOAL #3   Title  Patient will demonstrate improvement in MMT of 1 grade for hip extension in bilateral hips as evidence of improved hip extension strength to assist performance of gait and other functional activities.     Baseline  Right hip extension 3-/5 and left hip extension 3/5.     Time  8    Period  Weeks    Status  New    Target Date  01/06/18      PT LONG TERM GOAL #4   Title  Patient will report no greater than a 2/10 pain in right hip over the course of 1 week period indicating better tolerance of functional activities.     Baseline  Patient reported a 5/10 pain within the course of the last week.     Time  8    Period  Weeks    Status  New    Target Date  01/06/18            Plan - 11/17/17 1027    Clinical Impression Statement  Continued with exercise progression and established POC.  Reviewed HEP given at last session to ensure correct form.  Required cues with sidelying hip abduction alignment to isolate correct musculature.  Began standing hip strengthening exercises with cues for hold times and technique.  More difficulty maintaining SLS o Lt than RT LE.  Began vector activity to help improve this.   Pt with most difficutly with correct  squat form.  No pain or issues voiced at end of session.     Rehab Potential  Good    Clinical Impairments Affecting Rehab Potential  Positive: Patient's positive attitude, patient's motivation. Negative: Chronicity of current problem     PT Frequency  2x / week    PT Duration  8 weeks    PT  Treatment/Interventions  ADLs/Self Care Home Management;Aquatic Therapy;Cryotherapy;Electrical Stimulation;Moist Heat;Ultrasound;Gait training;DME Instruction;Stair training;Functional mobility training;Therapeutic activities;Therapeutic exercise;Balance training;Neuromuscular re-education;Patient/family education;Manual techniques;Passive range of motion    PT Next Visit Plan  Continue to improve LE strength.  Next session begin tandem stance and begin step ups.    PT Home Exercise Plan  11/13/2017: SLS, bridge, abduction and mini-squats       Patient will benefit from skilled therapeutic intervention in order to improve the following deficits and impairments:  Abnormal gait, Decreased balance, Decreased mobility, Decreased endurance, Difficulty walking, Decreased range of motion, Improper body mechanics, Decreased activity tolerance, Decreased strength, Pain  Visit Diagnosis: Pain in right hip  Balance problems  Decreased range of motion     Problem List Patient Active Problem List   Diagnosis Date Noted  . Effusion of knee joint 10/05/2014  . Acute blood loss anemia 12/22/2012  . Closed hip fracture (HCC) 12/21/2012  . Intertrochanteric fracture of right hip (HCC) 12/21/2012   Lurena NidaAmy B Jae Skeet, PTA/CLT [phone]  Lurena NidaFrazier, Paticia Moster B 11/17/2017, 10:31 AM  [facility] [address] [place], [place], [address] Phone: [phone]   Fax:  [phone]  Name: Verne GrainSuzanne F Sandora MRN: [id number] Date of Birth: 1941-06-09

## 2017-11-19 ENCOUNTER — Ambulatory Visit (HOSPITAL_COMMUNITY): Payer: Medicare Other | Admitting: Physical Therapy

## 2017-11-19 ENCOUNTER — Other Ambulatory Visit: Payer: Self-pay

## 2017-11-19 ENCOUNTER — Encounter (HOSPITAL_COMMUNITY): Payer: Self-pay | Admitting: Physical Therapy

## 2017-11-19 DIAGNOSIS — M25551 Pain in right hip: Secondary | ICD-10-CM | POA: Diagnosis not present

## 2017-11-19 DIAGNOSIS — R29898 Other symptoms and signs involving the musculoskeletal system: Secondary | ICD-10-CM

## 2017-11-19 DIAGNOSIS — M2560 Stiffness of unspecified joint, not elsewhere classified: Secondary | ICD-10-CM

## 2017-11-19 DIAGNOSIS — R2689 Other abnormalities of gait and mobility: Secondary | ICD-10-CM

## 2017-11-19 DIAGNOSIS — R531 Weakness: Secondary | ICD-10-CM

## 2017-11-19 NOTE — Therapy (Signed)
[place] [address] [place], [place], [address] Phone: [phone]   Fax:  [phone]  Physical Therapy Treatment  Patient Details  Name: Angela [name] MRN: [id number] Date of Birth: 1941/05/17 Referring Provider: Roe Rutherford NP   Encounter Date: 11/19/2017  PT End of Session - 11/19/17 0922    Visit Number  4    Number of Visits  17    Date for PT Re-Evaluation  12/09/17    Authorization Type  [employer]HC Medicare    Authorization Time Period  11/11/17 to 01/06/18    Authorization - Visit Number  4    Authorization - Number of Visits  10    PT Start Time  0902    PT Stop Time  0940    PT Time Calculation (min)  38 min    Activity Tolerance  Patient tolerated treatment well;No increased pain    Behavior During Therapy  WFL for tasks assessed/performed       Past Medical History:  Diagnosis Date  . Clostridium difficile infection 2010  . Hip fx (HCC)   . Use of cane as ambulatory aid     Past Surgical History:  Procedure Laterality Date  . INTRAMEDULLARY (IM) NAIL INTERTROCHANTERIC Right 12/21/2012   Procedure: INTRAMEDULLARY (IM) NAIL INTERTROCHANTRIC;  Surgeon: Vickki Hearing, MD;  Location: [facility];  Service: Orthopedics;  Laterality: Right;  . SKIN BIOPSY     3x 2013  . TONSILLECTOMY      There were no vitals filed for this visit.  Subjective Assessment - 11/19/17 0907    Subjective  Patient reported that she is not having any pain today. Patient reported that she can feel that the therapy is helping her get stronger already.     Currently in Pain?  No/denies                      [facility] Adult PT Treatment/Exercise - 11/19/17 0001      Knee/Hip Exercises: Standing   Heel Raises  Both;15 reps    Hip Flexion  Both;Limitations    Hip Flexion Limitations  marching alternating with 3" holds 20 total with intermittent upper extremity support    Forward Lunges  Right;Left;1 set;10 reps Single upper  extremity support; VCs knees behind toes    Hip Abduction  Both;10 reps    Abduction Limitations  Tactile and verbal cues to not lift leg as high to reduce discomfort and target gluteus medius    Hip Extension  Both;10 reps    Extension Limitations  Verbal cues to keep the knee straight     Functional Squat  10 reps    Functional Squat Limitations  minisquat, verbal cueing to improve mechanics    SLS  5 x on each lower extremity for a maximum of 8 seconds on the left and 20 seconds on the right    SLS with Vectors  5X5" each lower extremity with 1 UE assist    Other Standing Knee Exercises  Tandem stance on foam 3 x 20 seconds each lower extremity       Knee/Hip Exercises: Supine   Other Supine Knee/Hip Exercises  Clamshell with red theraband 2 x 10              PT Education - 11/19/17 0908    Education provided  Yes    Education Details  Patient was educated on purpose and technique of exercises throughout session.  Person(s) Educated  Patient    Methods  Explanation;Demonstration;Verbal cues    Comprehension  Verbalized understanding;Returned demonstration       PT Short Term Goals - 11/11/17 1224      PT SHORT TERM GOAL #1   Title  Patient will demonstrate understanding and report regular compliance with HEP in order to improve patient's functional mobility.     Time  4    Period  Weeks    Status  New    Target Date  12/09/17      PT SHORT TERM GOAL #2   Title  Patient will demonstrate improved hip extension, abduction, and flexion strength of 1/2 MMT grade bilaterally as evidence of improved hip strength in order to assist with performing functional mobility.     Baseline  Hip flexion strength 4+/5 bilaterally, hip abduction strength 4+/5 bilaterally, and hip extension of 3-/5 on the right and 3/5 on the right.    Time  4    Period  Weeks    Status  New    Target Date  12/09/17      PT SHORT TERM GOAL #3   Title  Patient will demonstrate ability to maintain  single limb stance on the right lower extremity for 5 seconds as evidence of improved balance and improved ability to perform functional activities requiring single limb stance such as stair navigation.     Baseline  Patient maintained single limb stance for 3 seconds.     Time  4    Period  Weeks    Status  New    Target Date  12/09/17        PT Long Term Goals - 11/11/17 1248      PT LONG TERM GOAL #1   Title  Patient will demonstrate an improvement on FOTO of 10% as evidence of patient's perceived improvement in functional mobility.     Baseline  Patient's score was 51% or 49% limited.     Time  8    Period  Weeks    Status  New    Target Date  01/06/18      PT LONG TERM GOAL #2   Title  Patient will perform 5x sit to stand in 12 seconds or less and without visible weight shift to the left lower extremity indicating improvement in ability to perform transitions and decrease risk of falling.     Baseline  Patient performed 5x sit to stand in 13 seconds this session with weight slightly shifted to the left lower extremity.     Time  8    Period  Weeks    Status  New    Target Date  01/06/18      PT LONG TERM GOAL #3   Title  Patient will demonstrate improvement in MMT of 1 grade for hip extension in bilateral hips as evidence of improved hip extension strength to assist performance of gait and other functional activities.     Baseline  Right hip extension 3-/5 and left hip extension 3/5.     Time  8    Period  Weeks    Status  New    Target Date  01/06/18      PT LONG TERM GOAL #4   Title  Patient will report no greater than a 2/10 pain in right hip over the course of 1 week period indicating better tolerance of functional activities.     Baseline  Patient reported a 5/10 pain within  the course of the last week.     Time  8    Period  Weeks    Status  New    Target Date  01/06/18            Plan - 11/19/17 0946    Clinical Impression Statement  This session  continued to focus on strengthening of patient's lower extremities and balance. This session added clamshell with red theraband and patient required cueing to bring knees further apart. Patient required verbal cueing to perform standing abduction and extension with smaller movements to target gluteal muscles more. This session also added tandem stance on foam which patient reported was challenging, and patient was able to maintain tandem stance for at least 20 seconds on foam with each lower extremity forward. Patient was instructed to continue with current home exercise program and that next session we would update exercises. Patient would benefit from continued skilled physical therapy in order to continue progressing patient's strength, balance and overall functional mobility.     Rehab Potential  Good    Clinical Impairments Affecting Rehab Potential  Positive: Patient's positive attitude, patient's motivation. Negative: Chronicity of current problem     PT Frequency  2x / week    PT Duration  8 weeks    PT Treatment/Interventions  ADLs/Self Care Home Management;Aquatic Therapy;Cryotherapy;Electrical Stimulation;Moist Heat;Ultrasound;Gait training;DME Instruction;Stair training;Functional mobility training;Therapeutic activities;Therapeutic exercise;Balance training;Neuromuscular re-education;Patient/family education;Manual techniques;Passive range of motion    PT Next Visit Plan  Next session add to HEP. Continue to improve LE strength.  Next session begin step ups.    PT Home Exercise Plan  11/13/2017: SLS, bridge, abduction and mini-squats       Patient will benefit from skilled therapeutic intervention in order to improve the following deficits and impairments:  Abnormal gait, Decreased balance, Decreased mobility, Decreased endurance, Difficulty walking, Decreased range of motion, Improper body mechanics, Decreased activity tolerance, Decreased strength, Pain  Visit Diagnosis: Pain in right  hip  Balance problems  Decreased range of motion  Decreased strength  Other symptoms and signs involving the musculoskeletal system  Other abnormalities of gait and mobility     Problem List Patient Active Problem List   Diagnosis Date Noted  . Effusion of knee joint 10/05/2014  . Acute blood loss anemia 12/22/2012  . Closed hip fracture (HCC) 12/21/2012  . Intertrochanteric fracture of right hip (HCC) 12/21/2012   Verne Carrow PT, DPT 9:49 AM, 11/19/17 [phone]  [facility] [address] [place], [place], [address] Phone: [phone]   Fax:  [phone]  Name: Angela [name] MRN: [id number] Date of Birth: 10/05/1941

## 2017-11-24 ENCOUNTER — Ambulatory Visit (HOSPITAL_COMMUNITY): Payer: Medicare Other

## 2017-11-24 ENCOUNTER — Encounter (HOSPITAL_COMMUNITY): Payer: Self-pay

## 2017-11-24 DIAGNOSIS — M25551 Pain in right hip: Secondary | ICD-10-CM | POA: Diagnosis not present

## 2017-11-24 DIAGNOSIS — M2560 Stiffness of unspecified joint, not elsewhere classified: Secondary | ICD-10-CM

## 2017-11-24 DIAGNOSIS — R531 Weakness: Secondary | ICD-10-CM

## 2017-11-24 DIAGNOSIS — R29898 Other symptoms and signs involving the musculoskeletal system: Secondary | ICD-10-CM

## 2017-11-24 DIAGNOSIS — R2689 Other abnormalities of gait and mobility: Secondary | ICD-10-CM

## 2017-11-24 NOTE — Therapy (Signed)
[facility] [address] [place], [place], [address] Phone: [phone]   Fax:  [phone]  Physical Therapy Treatment  Patient Details  Name: Angela [name] MRN: [id number] Date of Birth: [birth year] Referring Provider: Roe RutherfordKeatts, Courtney NP   Encounter Date: 11/24/2017  PT End of Session - 11/24/17 0946    Visit Number  5    Number of Visits  17    Date for PT Re-Evaluation  12/09/17    Authorization Type  [employer] Medicare    Authorization Time Period  11/11/17 to 01/06/18    Authorization - Visit Number  5    Authorization - Number of Visits  10    PT Start Time  0946    PT Stop Time  1031    PT Time Calculation (min)  45 min    Activity Tolerance  Patient tolerated treatment well;No increased pain    Behavior During Therapy  WFL for tasks assessed/performed       Past Medical History:  Diagnosis Date  . Clostridium difficile infection 2010  . Hip fx (HCC)   . Use of cane as ambulatory aid     Past Surgical History:  Procedure Laterality Date  . INTRAMEDULLARY (IM) NAIL INTERTROCHANTERIC Right 12/21/2012   Procedure: INTRAMEDULLARY (IM) NAIL INTERTROCHANTRIC;  Surgeon: Vickki HearingStanley E Harrison, MD;  Location: [facility];  Service: Orthopedics;  Laterality: Right;  . SKIN BIOPSY     3x 2013  . TONSILLECTOMY      There were no vitals filed for this visit.  Subjective Assessment - 11/24/17 0946    Subjective  Painful today with the cold weather. Walking most painful - 8/10; standing 2/10. Can feel the hardware shifting around when she walks.    Currently in Pain?  Yes    Pain Score  2     Pain Location  Hip    Pain Orientation  Right;Lateral    Pain Descriptors / Indicators  -- catching                      [facility] Adult PT Treatment/Exercise - 11/24/17 0001      Knee/Hip Exercises: Standing   Heel Raises  Both;15 reps    Hip Flexion  Both;Limitations    Hip Flexion Limitations  marching alternating with 4" holds 20  total with intermittent upper extremity support    Forward Lunges  Right;Left;1 set;10 reps Single upper extremity support; VCs knees behind toes    Hip Abduction  Both;10 reps    Abduction Limitations  Tactile and verbal cues to hold pelvis level during exs    Hip Extension  Both;10 reps    Lateral Step Up  Both;1 set;10 reps;Hand Hold: 0;Step Height: 4"    Forward Step Up  Both;1 set;10 reps;Hand Hold: 0;Step Height: 4"    Functional Squat  10 reps    Functional Squat Limitations  minisquat, verbal cueing to improve mechanics    SLS  5 x on each lower extremity for a maximum of 8 seconds on the left and 20 seconds on the right    SLS with Vectors  5X5" each lower extremity with 1 UE assist             PT Education - 11/24/17 1037    Education Details  HEP; Patient educated on purpose and technique of exercises throughout session.     Person(s) Educated  Patient    Methods  Explanation;Demonstration;Handout  Comprehension  Verbalized understanding       PT Short Term Goals - 11/24/17 1043      PT SHORT TERM GOAL #1   Title  Patient will demonstrate understanding and report regular compliance with HEP in order to improve patient's functional mobility.     Time  4    Period  Weeks    Status  Achieved      PT SHORT TERM GOAL #2   Title  Patient will demonstrate improved hip extension, abduction, and flexion strength of 1/2 MMT grade bilaterally as evidence of improved hip strength in order to assist with performing functional mobility.     Baseline  Hip flexion strength 4+/5 bilaterally, hip abduction strength 4+/5 bilaterally, and hip extension of 3-/5 on the right and 3/5 on the right.    Time  4    Period  Weeks    Status  On-going      PT SHORT TERM GOAL #3   Title  Patient will demonstrate ability to maintain single limb stance on the right lower extremity for 5 seconds as evidence of improved balance and improved ability to perform functional activities requiring  single limb stance such as stair navigation.     Baseline  Patient maintained single limb stance for 3 seconds. 11/24/17 - patient was able to balance on left leg for 17 seconds max and right leg 20 seconds max.    Time  4    Period  Weeks    Status  Achieved        PT Long Term Goals - 11/24/17 1044      PT LONG TERM GOAL #1   Title  Patient will demonstrate an improvement on FOTO of 10% as evidence of patient's perceived improvement in functional mobility.     Baseline  Patient's score was 51% or 49% limited.     Time  8    Period  Weeks    Status  New      PT LONG TERM GOAL #2   Title  Patient will perform 5x sit to stand in 12 seconds or less and without visible weight shift to the left lower extremity indicating improvement in ability to perform transitions and decrease risk of falling.     Baseline  Patient performed 5x sit to stand in 13 seconds this session with weight slightly shifted to the left lower extremity.     Time  8    Period  Weeks    Status  On-going      PT LONG TERM GOAL #3   Title  Patient will demonstrate improvement in MMT of 1 grade for hip extension in bilateral hips as evidence of improved hip extension strength to assist performance of gait and other functional activities.     Baseline  Right hip extension 3-/5 and left hip extension 3/5.     Time  8    Period  Weeks    Status  On-going      PT LONG TERM GOAL #4   Title  Patient will report no greater than a 2/10 pain in right hip over the course of 1 week period indicating better tolerance of functional activities.     Baseline  Patient reported a 5/10 pain within the course of the last week.     Time  8    Period  Weeks    Status  On-going            Plan -  11/24/17 1038    Clinical Impression Statement  Session continued to focus on strengthening, coordination and balance training. Added standing hip ext and abd to HEP and gave patient red Theraband to patient can progress clam shell  strengthening exercise at home. Patient was able to balance on Lt leg for max 17 sec and rt leg max 20 sec today. Patient will continue to benefit from skilled physical therapy in order to continue progressing patient's strength, balance and overall functional mobility.     Rehab Potential  Good    Clinical Impairments Affecting Rehab Potential  Positive: Patient's positive attitude, patient's motivation. Negative: Chronicity of current problem     PT Frequency  2x / week    PT Duration  8 weeks    PT Treatment/Interventions  ADLs/Self Care Home Management;Aquatic Therapy;Cryotherapy;Electrical Stimulation;Moist Heat;Ultrasound;Gait training;DME Instruction;Stair training;Functional mobility training;Therapeutic activities;Therapeutic exercise;Balance training;Neuromuscular re-education;Patient/family education;Manual techniques;Passive range of motion    PT Next Visit Plan  Next session add to HEP. Continue to improve LE strength.  Next session begin step ups 6".    PT Home Exercise Plan  11/13/2017: SLS, bridge, abduction and mini-squats; 11/24/17 - RTG for clam shells, standing hip ext and abd no resistance       Patient will benefit from skilled therapeutic intervention in order to improve the following deficits and impairments:  Abnormal gait, Decreased balance, Decreased mobility, Decreased endurance, Difficulty walking, Decreased range of motion, Improper body mechanics, Decreased activity tolerance, Decreased strength, Pain  Visit Diagnosis: Pain in right hip  Balance problems  Decreased range of motion  Decreased strength  Other symptoms and signs involving the musculoskeletal system  Other abnormalities of gait and mobility     Problem List Patient Active Problem List   Diagnosis Date Noted  . Effusion of knee joint 10/05/2014  . Acute blood loss anemia 12/22/2012  . Closed hip fracture (HCC) 12/21/2012  . Intertrochanteric fracture of right hip (HCC) 12/21/2012     Katina Dung. Hartnett-Rands, MS, PT Per [id number] 11/24/2017, 10:47 AM  [place] [facility] [address] [place], [place], [address] Phone: [phone]   Fax:  [phone]  Name: Angela [name] MRN: [id number] Date of Birth: [birth year]

## 2017-11-24 NOTE — Patient Instructions (Signed)
Standing Hip Extension    Bring leg back as far as possible. Repeat with other leg. Repeat _10___ times. 1 sets. Do _2___ sessions per day.  http://gt2.exer.us/396   Copyright  VHI. All rights reserved.  HIP: Abduction - Standing    Squeeze glutes. Lead with heel. Raise leg out and slightly back. _10__ reps per set, 2 sets.  _1__ sets per day, ___ days per week Hold onto a support.  Copyright  VHI. All rights reserved.

## 2017-11-26 ENCOUNTER — Ambulatory Visit (HOSPITAL_COMMUNITY): Payer: Medicare Other

## 2017-11-26 ENCOUNTER — Encounter (HOSPITAL_COMMUNITY): Payer: Self-pay

## 2017-11-26 DIAGNOSIS — M25551 Pain in right hip: Secondary | ICD-10-CM

## 2017-11-26 DIAGNOSIS — R29898 Other symptoms and signs involving the musculoskeletal system: Secondary | ICD-10-CM

## 2017-11-26 DIAGNOSIS — M2560 Stiffness of unspecified joint, not elsewhere classified: Secondary | ICD-10-CM

## 2017-11-26 DIAGNOSIS — R2689 Other abnormalities of gait and mobility: Secondary | ICD-10-CM

## 2017-11-26 DIAGNOSIS — R531 Weakness: Secondary | ICD-10-CM

## 2017-11-26 NOTE — Therapy (Signed)
[facility] [address] [place], [place], [address] Phone: [phone]   Fax:  [phone]  Physical Therapy Treatment  Patient Details  Name: Angela [name] MRN: [id number] Date of Birth: 05-Jul-1941 Referring Provider: Roe Rutherford NP   Encounter Date: 11/26/2017  PT End of Session - 11/26/17 0908    Visit Number  6    Number of Visits  17    Date for PT Re-Evaluation  12/09/17    Authorization Type  UHC Medicare    Authorization Time Period  11/11/17 to 01/06/18    Authorization - Visit Number  6    Authorization - Number of Visits  10    PT Start Time  0904    PT Stop Time  0947    PT Time Calculation (min)  43 min    Activity Tolerance  Patient tolerated treatment well;No increased pain    Behavior During Therapy  WFL for tasks assessed/performed       Past Medical History:  Diagnosis Date  . Clostridium difficile infection 2010  . Hip fx (HCC)   . Use of cane as ambulatory aid     Past Surgical History:  Procedure Laterality Date  . INTRAMEDULLARY (IM) NAIL INTERTROCHANTERIC Right 12/21/2012   Procedure: INTRAMEDULLARY (IM) NAIL INTERTROCHANTRIC;  Surgeon: Vickki Hearing, MD;  Location: [facility];  Service: Orthopedics;  Laterality: Right;  . SKIN BIOPSY     3x 2013  . TONSILLECTOMY      There were no vitals filed for this visit.  Subjective Assessment - 11/26/17 0906    Subjective  Pt stated the cold weather increases her pain, current pain scale 2/10 Rt hip.      Pertinent History  Right hip fracture, Surgically fixed with Intramedullary nail, intertrochanteric in 2014    Patient Stated Goals  To be able to bend over far enough to the right to put on shoes and socks    Currently in Pain?  Yes    Pain Score  2     Pain Location  Hip    Pain Orientation  Right    Pain Descriptors / Indicators  Sore grabs in certain movement    Pain Onset  More than a month ago    Pain Frequency  Intermittent    Aggravating  Factors   sitting or standing for too long    Pain Relieving Factors  aleve    Effect of Pain on Daily Activities  works through the pain                      [facility] Adult PT Treatment/Exercise - 11/26/17 0001      Knee/Hip Exercises: Standing   Heel Raises  Both;15 reps Toe raises    Hip Flexion  Both;Limitations    Hip Flexion Limitations  marching alternating with 4" holds 20 total with intermittent upper extremity support    Forward Lunges  Both;15 reps verbal cueing for mechanics    Hip Abduction  1 set;Knee straight sidestep with RTB    Lateral Step Up  Both;1 set;10 reps;Hand Hold: 0;Step Height: 6"    Forward Step Up  15 reps;Hand Hold: 1;Step Height: 6";Right    Functional Squat  10 reps    Functional Squat Limitations  verbal cueing and demonstration to improve mechanics    SLS  5x each    SLS with Vectors  5X5" each lower extremity with 1 UE assist  PT Short Term Goals - 11/24/17 1043      PT SHORT TERM GOAL #1   Title  Patient will demonstrate understanding and report regular compliance with HEP in order to improve patient's functional mobility.     Time  4    Period  Weeks    Status  Achieved      PT SHORT TERM GOAL #2   Title  Patient will demonstrate improved hip extension, abduction, and flexion strength of 1/2 MMT grade bilaterally as evidence of improved hip strength in order to assist with performing functional mobility.     Baseline  Hip flexion strength 4+/5 bilaterally, hip abduction strength 4+/5 bilaterally, and hip extension of 3-/5 on the right and 3/5 on the right.    Time  4    Period  Weeks    Status  On-going      PT SHORT TERM GOAL #3   Title  Patient will demonstrate ability to maintain single limb stance on the right lower extremity for 5 seconds as evidence of improved balance and improved ability to perform functional activities requiring single limb stance such as stair navigation.     Baseline  Patient  maintained single limb stance for 3 seconds. 11/24/17 - patient was able to balance on left leg for 17 seconds max and right leg 20 seconds max.    Time  4    Period  Weeks    Status  Achieved        PT Long Term Goals - 11/24/17 1044      PT LONG TERM GOAL #1   Title  Patient will demonstrate an improvement on FOTO of 10% as evidence of patient's perceived improvement in functional mobility.     Baseline  Patient's score was 51% or 49% limited.     Time  8    Period  Weeks    Status  New      PT LONG TERM GOAL #2   Title  Patient will perform 5x sit to stand in 12 seconds or less and without visible weight shift to the left lower extremity indicating improvement in ability to perform transitions and decrease risk of falling.     Baseline  Patient performed 5x sit to stand in 13 seconds this session with weight slightly shifted to the left lower extremity.     Time  8    Period  Weeks    Status  On-going      PT LONG TERM GOAL #3   Title  Patient will demonstrate improvement in MMT of 1 grade for hip extension in bilateral hips as evidence of improved hip extension strength to assist performance of gait and other functional activities.     Baseline  Right hip extension 3-/5 and left hip extension 3/5.     Time  8    Period  Weeks    Status  On-going      PT LONG TERM GOAL #4   Title  Patient will report no greater than a 2/10 pain in right hip over the course of 1 week period indicating better tolerance of functional activities.     Baseline  Patient reported a 5/10 pain within the course of the last week.     Time  8    Period  Weeks    Status  On-going            Plan - 11/26/17 [id number]    Clinical Impression Statement  Continued  session focus on LE strengthening and balance training.  Increased step height with stair training and began step down for functional strengthening and improve eccentric quad control.  Therapist facilitation to assure correct form especially  mechanics with squats and abduction movements to reduce compensation.  No reports of pain through session.  Added marches to HEP to improve SLS and hip flexion strength.    Rehab Potential  Good    Clinical Impairments Affecting Rehab Potential  Positive: Patient's positive attitude, patient's motivation. Negative: Chronicity of current problem     PT Frequency  2x / week    PT Duration  8 weeks    PT Treatment/Interventions  ADLs/Self Care Home Management;Aquatic Therapy;Cryotherapy;Electrical Stimulation;Moist Heat;Ultrasound;Gait training;DME Instruction;Stair training;Functional mobility training;Therapeutic activities;Therapeutic exercise;Balance training;Neuromuscular re-education;Patient/family education;Manual techniques;Passive range of motion    PT Next Visit Plan  Continue to improve LE strength and balance training    PT Home Exercise Plan  11/13/2017: SLS, bridge, abduction and mini-squats; 11/24/17 - RTG for clam shells, standing hip ext and abd no resistance; 11/26/17: March       Patient will benefit from skilled therapeutic intervention in order to improve the following deficits and impairments:  Abnormal gait, Decreased balance, Decreased mobility, Decreased endurance, Difficulty walking, Decreased range of motion, Improper body mechanics, Decreased activity tolerance, Decreased strength, Pain  Visit Diagnosis: Pain in right hip  Balance problems  Decreased range of motion  Decreased strength  Other symptoms and signs involving the musculoskeletal system  Other abnormalities of gait and mobility     Problem List Patient Active Problem List   Diagnosis Date Noted  . Effusion of knee joint 10/05/2014  . Acute blood loss anemia 12/22/2012  . Closed hip fracture (HCC) 12/21/2012  . Intertrochanteric fracture of right hip [facility]) 12/21/2012   Becky Sax, LPTA; CBIS [phone]  Juel Burrow 11/26/2017, 10:22 AM  [place] [facility] [address] [place], [place], [address] Phone: [phone]   Fax:  [phone]  Name: Angela [name] MRN: [id number] Date of Birth: [birth year]

## 2017-11-26 NOTE — Patient Instructions (Signed)
FUNCTIONAL MOBILITY: Marching - Standing    March in place by lifting left leg up, then right. Alternate holding for 5" 10 reps per set, 1-2 sets per day. Hold onto counter for support.  Copyright  VHI. All rights reserved.

## 2017-12-01 ENCOUNTER — Ambulatory Visit (HOSPITAL_COMMUNITY): Payer: Medicare Other | Admitting: Physical Therapy

## 2017-12-01 DIAGNOSIS — M25551 Pain in right hip: Secondary | ICD-10-CM

## 2017-12-01 DIAGNOSIS — M2560 Stiffness of unspecified joint, not elsewhere classified: Secondary | ICD-10-CM

## 2017-12-01 DIAGNOSIS — R2689 Other abnormalities of gait and mobility: Secondary | ICD-10-CM

## 2017-12-01 NOTE — Therapy (Signed)
[place] [facility] [address] [place], [place], [address] Phone: [phone]   Fax:  [phone]  Physical Therapy Treatment  Patient Details  Name: Angela [name] MRN: [id number] Date of Birth: [birth year] Referring Provider: Roe Rutherford NP   Encounter Date: 12/01/2017  PT End of Session - 12/01/17 1029    Visit Number  7    Number of Visits  17    Date for PT Re-Evaluation  12/09/17    Authorization Type  UHC Medicare    Authorization Time Period  11/11/17 to 01/06/18    Authorization - Visit Number  7    Authorization - Number of Visits  10    PT Start Time  0949    PT Stop Time  1030    PT Time Calculation (min)  41 min    Activity Tolerance  Patient tolerated treatment well;No increased pain    Behavior During Therapy  WFL for tasks assessed/performed       Past Medical History:  Diagnosis Date  . Clostridium difficile infection 2010  . Hip fx (HCC)   . Use of cane as ambulatory aid     Past Surgical History:  Procedure Laterality Date  . INTRAMEDULLARY (IM) NAIL INTERTROCHANTERIC Right 12/21/2012   Procedure: INTRAMEDULLARY (IM) NAIL INTERTROCHANTRIC;  Surgeon: Vickki Hearing, MD;  Location: [facility];  Service: Orthopedics;  Laterality: Right;  . SKIN BIOPSY     3x 2013  . TONSILLECTOMY      There were no vitals filed for this visit.  Subjective Assessment - 12/01/17 1002    Subjective  Pt states no pain currently but sometimes the "nail grabs me when I get out of bed".                        [facility] Adult PT Treatment/Exercise - 12/01/17 0001      Knee/Hip Exercises: Standing   Heel Raises  Both;20 reps    Hip Flexion  Both;Limitations    Hip Flexion Limitations  marching alternating with 4" holds 20 total with intermittent upper extremity support    Forward Lunges  Both;20 reps;Limitations    Forward Lunges Limitations  onto 4" box intermittent HHA    Hip Extension  Both;15 reps    Lateral Step  Up  Both;1 set;Hand Hold: 0;Step Height: 6";15 reps    Forward Step Up  15 reps;Hand Hold: 1;Step Height: 6";Right    Step Down  Both;Hand Hold: 1;Step Height: 4";10 reps    Functional Squat  15 reps    Functional Squat Limitations  verbal cueing and demonstration to improve mechanics    SLS with Vectors  5X5" each lower extremity with 1 UE assist    Other Standing Knee Exercises  RTB sidestepping RTB 2RT               PT Short Term Goals - 11/24/17 1043      PT SHORT TERM GOAL #1   Title  Patient will demonstrate understanding and report regular compliance with HEP in order to improve patient's functional mobility.     Time  4    Period  Weeks    Status  Achieved      PT SHORT TERM GOAL #2   Title  Patient will demonstrate improved hip extension, abduction, and flexion strength of 1/2 MMT grade bilaterally as evidence of improved hip strength in order to assist with performing functional mobility.  Baseline  Hip flexion strength 4+/5 bilaterally, hip abduction strength 4+/5 bilaterally, and hip extension of 3-/5 on the right and 3/5 on the right.    Time  4    Period  Weeks    Status  On-going      PT SHORT TERM GOAL #3   Title  Patient will demonstrate ability to maintain single limb stance on the right lower extremity for 5 seconds as evidence of improved balance and improved ability to perform functional activities requiring single limb stance such as stair navigation.     Baseline  Patient maintained single limb stance for 3 seconds. 11/24/17 - patient was able to balance on left leg for 17 seconds max and right leg 20 seconds max.    Time  4    Period  Weeks    Status  Achieved        PT Long Term Goals - 11/24/17 1044      PT LONG TERM GOAL #1   Title  Patient will demonstrate an improvement on FOTO of 10% as evidence of patient's perceived improvement in functional mobility.     Baseline  Patient's score was 51% or 49% limited.     Time  8    Period  Weeks     Status  New      PT LONG TERM GOAL #2   Title  Patient will perform 5x sit to stand in 12 seconds or less and without visible weight shift to the left lower extremity indicating improvement in ability to perform transitions and decrease risk of falling.     Baseline  Patient performed 5x sit to stand in 13 seconds this session with weight slightly shifted to the left lower extremity.     Time  8    Period  Weeks    Status  On-going      PT LONG TERM GOAL #3   Title  Patient will demonstrate improvement in MMT of 1 grade for hip extension in bilateral hips as evidence of improved hip extension strength to assist performance of gait and other functional activities.     Baseline  Right hip extension 3-/5 and left hip extension 3/5.     Time  8    Period  Weeks    Status  On-going      PT LONG TERM GOAL #4   Title  Patient will report no greater than a 2/10 pain in right hip over the course of 1 week period indicating better tolerance of functional activities.     Baseline  Patient reported a 5/10 pain within the course of the last week.     Time  8    Period  Weeks    Status  On-going            Plan - 12/01/17 1029    Clinical Impression Statement  Focus on LE strengthening and stability this session.  Able to resume hip extensions, lunges and added forward step downs.  Able to increase all reps today without diffiuclty.  Cues to correct weight shifting to Lt  LE with functional squats and larger steps with side stepping activity, otherwise good form with most exercises.     Rehab Potential  Good    Clinical Impairments Affecting Rehab Potential  Positive: Patient's positive attitude, patient's motivation. Negative: Chronicity of current problem     PT Frequency  2x / week    PT Duration  8 weeks    PT  Treatment/Interventions  ADLs/Self Care Home Management;Aquatic Therapy;Cryotherapy;Electrical Stimulation;Moist Heat;Ultrasound;Gait training;DME Instruction;Stair  training;Functional mobility training;Therapeutic activities;Therapeutic exercise;Balance training;Neuromuscular re-education;Patient/family education;Manual techniques;Passive range of motion    PT Next Visit Plan  Continue to improve LE strength and balance training    PT Home Exercise Plan  11/13/2017: SLS, bridge, abduction and mini-squats; 11/24/17 - RTG for clam shells, standing hip ext and abd no resistance; 11/26/17: March       Patient will benefit from skilled therapeutic intervention in order to improve the following deficits and impairments:  Abnormal gait, Decreased balance, Decreased mobility, Decreased endurance, Difficulty walking, Decreased range of motion, Improper body mechanics, Decreased activity tolerance, Decreased strength, Pain  Visit Diagnosis: Pain in right hip  Balance problems  Decreased range of motion     Problem List Patient Active Problem List   Diagnosis Date Noted  . Effusion of knee joint 10/05/2014  . Acute blood loss anemia 12/22/2012  . Closed hip fracture (HCC) 12/21/2012  . Intertrochanteric fracture of right hip (HCC) 12/21/2012   Lurena Nida, PTA/CLT [phone]  Lurena Nida 12/01/2017, 10:34 AM  [place] [facility] [address] [place], [place], [address] Phone: [phone]   Fax:  [phone]  Name: Angela [name] MRN: [id number] Date of Birth: [birth year]

## 2017-12-03 ENCOUNTER — Ambulatory Visit (HOSPITAL_COMMUNITY): Payer: Medicare Other | Admitting: Physical Therapy

## 2017-12-03 DIAGNOSIS — M2560 Stiffness of unspecified joint, not elsewhere classified: Secondary | ICD-10-CM

## 2017-12-03 DIAGNOSIS — M25551 Pain in right hip: Secondary | ICD-10-CM

## 2017-12-03 DIAGNOSIS — R531 Weakness: Secondary | ICD-10-CM

## 2017-12-03 DIAGNOSIS — R2689 Other abnormalities of gait and mobility: Secondary | ICD-10-CM

## 2017-12-03 NOTE — Therapy (Signed)
[facility] [facility] [address] [place], [place], [address] Phone: [phone]   Fax:  [phone]  Physical Therapy Treatment  Patient Details  Name: Angela [name] MRN: [id number] Date of Birth: Apr 04, 1941 Referring Provider: Roe Rutherford NP   Encounter Date: 12/03/2017  PT End of Session - 12/03/17 0915    Visit Number  8    Number of Visits  17    Date for PT Re-Evaluation  12/09/17    Authorization Type  UHC Medicare    Authorization Time Period  11/11/17 to 01/06/18    Authorization - Visit Number  8    Authorization - Number of Visits  10    PT Start Time  0900    PT Stop Time  0945    PT Time Calculation (min)  45 min    Activity Tolerance  Patient tolerated treatment well;No increased pain    Behavior During Therapy  WFL for tasks assessed/performed       Past Medical History:  Diagnosis Date  . Clostridium difficile infection 2010  . Hip fx (HCC)   . Use of cane as ambulatory aid     Past Surgical History:  Procedure Laterality Date  . INTRAMEDULLARY (IM) NAIL INTERTROCHANTERIC Right 12/21/2012   Procedure: INTRAMEDULLARY (IM) NAIL INTERTROCHANTRIC;  Surgeon: Vickki Hearing, MD;  Location: [facility];  Service: Orthopedics;  Laterality: Right;  . SKIN BIOPSY     3x 2013  . TONSILLECTOMY      There were no vitals filed for this visit.  Subjective Assessment - 12/03/17 0936    Subjective  Pt reports the "nail" bothers her the most in her Rt hip.  Pt states she is hurting more today at 7/10.  when she woke, wasn't that bad but has increased as she's been up this morning.  STates she can tell her legs are getting stronger with the exercises.  Trying to walk her dog everyday to also get exercise.    Currently in Pain?  No/denies                      [facility] Adult PT Treatment/Exercise - 12/03/17 0001      Knee/Hip Exercises: Standing   Hip Flexion  Both;Limitations    Hip Flexion Limitations  marching  alternating with 4" holds 20 total with intermittent upper extremity support    Forward Lunges  Both;20 reps;Limitations    Forward Lunges Limitations  onto 4" box intermittent HHA    Hip Abduction  Both;15 reps    Hip Extension  Both;15 reps    Lateral Step Up  Both;1 set;Hand Hold: 0;Step Height: 6";15 reps    Forward Step Up  15 reps;Hand Hold: 1;Step Height: 6";Right    Step Down  Both;Hand Hold: 1;10 reps;Step Height: 6"    SLS with Vectors  10X5" each lower extremity with 1 UE assist    Other Standing Knee Exercises  Tandem stance on foam 3 x 20 seconds each lower extremity                PT Short Term Goals - 11/24/17 1043      PT SHORT TERM GOAL #1   Title  Patient will demonstrate understanding and report regular compliance with HEP in order to improve patient's functional mobility.     Time  4    Period  Weeks    Status  Achieved      PT SHORT TERM GOAL #  2   Title  Patient will demonstrate improved hip extension, abduction, and flexion strength of 1/2 MMT grade bilaterally as evidence of improved hip strength in order to assist with performing functional mobility.     Baseline  Hip flexion strength 4+/5 bilaterally, hip abduction strength 4+/5 bilaterally, and hip extension of 3-/5 on the right and 3/5 on the right.    Time  4    Period  Weeks    Status  On-going      PT SHORT TERM GOAL #3   Title  Patient will demonstrate ability to maintain single limb stance on the right lower extremity for 5 seconds as evidence of improved balance and improved ability to perform functional activities requiring single limb stance such as stair navigation.     Baseline  Patient maintained single limb stance for 3 seconds. 11/24/17 - patient was able to balance on left leg for 17 seconds max and right leg 20 seconds max.    Time  4    Period  Weeks    Status  Achieved        PT Long Term Goals - 11/24/17 1044      PT LONG TERM GOAL #1   Title  Patient will demonstrate an  improvement on FOTO of 10% as evidence of patient's perceived improvement in functional mobility.     Baseline  Patient's score was 51% or 49% limited.     Time  8    Period  Weeks    Status  New      PT LONG TERM GOAL #2   Title  Patient will perform 5x sit to stand in 12 seconds or less and without visible weight shift to the left lower extremity indicating improvement in ability to perform transitions and decrease risk of falling.     Baseline  Patient performed 5x sit to stand in 13 seconds this session with weight slightly shifted to the left lower extremity.     Time  8    Period  Weeks    Status  On-going      PT LONG TERM GOAL #3   Title  Patient will demonstrate improvement in MMT of 1 grade for hip extension in bilateral hips as evidence of improved hip extension strength to assist performance of gait and other functional activities.     Baseline  Right hip extension 3-/5 and left hip extension 3/5.     Time  8    Period  Weeks    Status  On-going      PT LONG TERM GOAL #4   Title  Patient will report no greater than a 2/10 pain in right hip over the course of 1 week period indicating better tolerance of functional activities.     Baseline  Patient reported a 5/10 pain within the course of the last week.     Time  8    Period  Weeks    Status  On-going            Plan - 12/03/17 1610    Clinical Impression Statement  Continued focus on strength and stability of Rt LE this session.  Initial discomfort with Rt forward lunge but able to work through and complete with decreased depth.  Increased to 6" step for step downs without difficulty and good eccentric control displayed.  Resumed tandem stance using foam pad with noted increased difficulty with Rt in back vs Lt.  Overall good stability with this activity,  improved from initial trial.  Encouraged pateint to question MD about her "nail" when she returns if this continues to bother her.   Pt also brought in her sock donner  to trouble shoot with success.      Rehab Potential  Good    Clinical Impairments Affecting Rehab Potential  Positive: Patient's positive attitude, patient's motivation. Negative: Chronicity of current problem     PT Frequency  2x / week    PT Duration  8 weeks    PT Treatment/Interventions  ADLs/Self Care Home Management;Aquatic Therapy;Cryotherapy;Electrical Stimulation;Moist Heat;Ultrasound;Gait training;DME Instruction;Stair training;Functional mobility training;Therapeutic activities;Therapeutic exercise;Balance training;Neuromuscular re-education;Patient/family education;Manual techniques;Passive range of motion    PT Next Visit Plan  Continue to improve LE strength and balance training.  Next session add UE activity to tandem stance on foam,  begin tandem and retro gait on stable surface progressing to balance beam when able.     PT Home Exercise Plan  11/13/2017: SLS, bridge, abduction and mini-squats; 11/24/17 - RTG for clam shells, standing hip ext and abd no resistance; 11/26/17: March       Patient will benefit from skilled therapeutic intervention in order to improve the following deficits and impairments:  Abnormal gait, Decreased balance, Decreased mobility, Decreased endurance, Difficulty walking, Decreased range of motion, Improper body mechanics, Decreased activity tolerance, Decreased strength, Pain  Visit Diagnosis: Pain in right hip  Balance problems  Decreased range of motion  Decreased strength     Problem List Patient Active Problem List   Diagnosis Date Noted  . Effusion of knee joint 10/05/2014  . Acute blood loss anemia 12/22/2012  . Closed hip fracture (HCC) 12/21/2012  . Intertrochanteric fracture of right hip (HCC) 12/21/2012   Lurena NidaAmy B Frazier, PTA/CLT [phone]  Lurena NidaFrazier, Amy B 12/03/2017, 9:48 AM  [place] [facility] [address] [place], [place], [address] Phone: [phone]   Fax:  [phone]  Name:  Verne GrainSuzanne F Kitchings MRN: [id number] Date of Birth: 05-08-1941

## 2017-12-08 ENCOUNTER — Ambulatory Visit (HOSPITAL_COMMUNITY): Payer: Medicare Other | Attending: Adult Health Nurse Practitioner | Admitting: Physical Therapy

## 2017-12-08 ENCOUNTER — Encounter (HOSPITAL_COMMUNITY): Payer: Self-pay | Admitting: Physical Therapy

## 2017-12-08 DIAGNOSIS — R2689 Other abnormalities of gait and mobility: Secondary | ICD-10-CM | POA: Diagnosis present

## 2017-12-08 DIAGNOSIS — R29898 Other symptoms and signs involving the musculoskeletal system: Secondary | ICD-10-CM | POA: Insufficient documentation

## 2017-12-08 DIAGNOSIS — M25551 Pain in right hip: Secondary | ICD-10-CM | POA: Insufficient documentation

## 2017-12-08 DIAGNOSIS — M2560 Stiffness of unspecified joint, not elsewhere classified: Secondary | ICD-10-CM | POA: Insufficient documentation

## 2017-12-08 DIAGNOSIS — R531 Weakness: Secondary | ICD-10-CM | POA: Diagnosis present

## 2017-12-08 NOTE — Therapy (Signed)
[facility] [address] [place], [place], [address] Phone: [phone]   Fax:  [phone]  Physical Therapy Treatment / Re-assessment  Patient Details  Name: Angela [name] MRN: [id number] Date of Birth: 1941-05-10 Referring Provider: Roe RutherfordKeatts, Courtney NP   Encounter Date: 12/08/2017    Past Medical History:  Diagnosis Date  . Clostridium difficile infection 2010  . Hip fx (HCC)   . Use of cane as ambulatory aid     Past Surgical History:  Procedure Laterality Date  . INTRAMEDULLARY (IM) NAIL INTERTROCHANTERIC Right 12/21/2012   Procedure: INTRAMEDULLARY (IM) NAIL INTERTROCHANTRIC;  Surgeon: Vickki HearingStanley E Harrison, MD;  Location: [facility];  Service: Orthopedics;  Laterality: Right;  . SKIN BIOPSY     3x 2013  . TONSILLECTOMY      There were no vitals filed for this visit.  Subjective Assessment - 12/08/17 0951    Subjective  Patient reported that she stepped into a hole and tweaked her hip some on Saturday, but that she is feeling okay. Patient reported she is having a 2/10 pain.     How long can you sit comfortably?  10-15 minutes on a hard surface    How long can you stand comfortably?  30 minutes    How long can you walk comfortably?  Difficulty with getting up and getting moving; 30 minutes     Patient Stated Goals  To be able to bend over far enough to the right to put on shoes and socks    Currently in Pain?  Yes    Pain Score  2     Pain Location  Hip    Pain Orientation  Right    Pain Descriptors / Indicators  Sore    Pain Type  Chronic pain    Pain Onset  More than a month ago    Multiple Pain Sites  No         [facility] PT Assessment - 12/08/17 0001      Assessment   Medical Diagnosis  Right hip pain    Referring Provider  Roe RutherfordKeatts, Courtney NP    Onset Date/Surgical Date  -- March 2014    [facility]      Observation/Other Assessments   Focus on Therapeutic Outcomes (FOTO)   54% (46%  limitation)      Squat   Comments  Patient demonstrated increased forward flexion of trunk in order to squat to the ground to pick up an object      Sit to Stand   Comments  Patient demonstrated need for increased time with sit to stand and slight weight shift toward left lower extremity       AROM   Right Hip Flexion  80    Left Hip Flexion  90      PROM   Right Hip External Rotation   15    Right Hip Internal Rotation   10    Left Hip External Rotation   35    Left Hip Internal Rotation   25      Strength   Right Hip Flexion  5/5    Right Hip Extension  3/5    Right Hip ABduction  4+/5    Left Hip Flexion  5/5    Left Hip Extension  3+/5    Left Hip ABduction  4+/5    Right Knee Flexion  5/5    Right Knee Extension  5/5    Left Knee Flexion  5/5    Left Knee Extension  5/5    Right Ankle Dorsiflexion  5/5    Right Ankle Plantar Flexion  5/5    Left Ankle Dorsiflexion  5/5    Left Ankle Plantar Flexion  5/5      Ambulation/Gait   Stairs  Yes    Stairs Assistance  7: Independent    Number of Stairs  16    Gait Comments  Patient was observed ambulating around clinic during session and patient demonstrated decreased stance time on the right lower extremity although not as marked as initial evaluation. Patient was able to perform ascending and descending 4, 6 inch stairs x 4 times, noted slight decreased eccentric quadricep control with this.      Static Standing Balance   Static Standing - Balance Support  No upper extremity supported    Static Standing - Level of Assistance  7: Independent    Static Standing Balance -  Activities   Single Leg Stance - Right Leg;Single Leg Stance - Left Leg;Tandam Stance - Right Leg;Tandam Stance - Left Leg    Static Standing - Comment/# of Minutes  Rt SLS: 16 seconds; Lt. SLS: 5 seconds                  [facility] Adult PT Treatment/Exercise - 12/08/17 0001      Knee/Hip Exercises: Standing   Hip Flexion  Both;Limitations     Hip Flexion Limitations  marching alternating with 4" holds 20 total with intermittent upper extremity support    Forward Lunges  2 sets;10 reps;Left;Both    Forward Lunges Limitations  onto 4" box intermittent HHA    Hip Abduction  Both;15 reps With red theraband    Hip Extension  Both;15 reps With 2# ankle weights    Lateral Step Up  Both;1 set;Hand Hold: 0;Step Height: 6";15 reps    Forward Step Up  15 reps;Step Height: 6";Right;Hand Hold: 0    Other Standing Knee Exercises  Sit to stands 2 x 10 from chair without upper extremity support             PT Education - 12/08/17 1017    Education provided  Yes    Education Details  Patient educated on progress with re-assessment about purpose and technique of exercises.     Person(s) Educated  Patient    Methods  Explanation;Verbal cues    Comprehension  Verbalized understanding       PT Short Term Goals - 12/08/17 1018      PT SHORT TERM GOAL #1   Title  Patient will demonstrate understanding and report regular compliance with HEP in order to improve patient's functional mobility.     Time  4    Period  Weeks    Status  Achieved      PT SHORT TERM GOAL #2   Title  Patient will demonstrate improved hip extension, abduction, and flexion strength of 1/2 MMT grade bilaterally as evidence of improved hip strength in order to assist with performing functional mobility.     Baseline  12/08/17: Patient has improved by 1/2 MMT grade in all except hip extension bilaterally    Time  4    Period  Weeks    Status  On-going      PT SHORT TERM GOAL #3   Title  Patient will demonstrate ability to maintain single limb stance on the right lower extremity for 5  seconds as evidence of improved balance and improved ability to perform functional activities requiring single limb stance such as stair navigation.     Baseline  12/08/17: Patient maintained SLS for 16 seconds on the right lower extremity.     Time  4    Period  Weeks    Status  Achieved         PT Long Term Goals - 12/08/17 1019      PT LONG TERM GOAL #1   Title  Patient will demonstrate an improvement on FOTO of 10% as evidence of patient's perceived improvement in functional mobility.     Baseline  12/08/17: Patient demonstrated an improvement of 3%.     Time  8    Period  Weeks    Status  On-going      PT LONG TERM GOAL #2   Title  Patient will perform 5x sit to stand in 12 seconds or less and without visible weight shift to the left lower extremity indicating improvement in ability to perform transitions and decrease risk of falling.     Baseline  12/08/17: Patient performed 5x sit to stand in 13.33 seconds this session with weight slightly shifted to the left lower extremity.     Time  8    Period  Weeks    Status  On-going      PT LONG TERM GOAL #3   Title  Patient will demonstrate improvement in MMT of 1 grade for hip extension in bilateral hips as evidence of improved hip extension strength to assist performance of gait and other functional activities.     Baseline  12/08/17: Patient demonstrated improvement of 1/2 MMT grade for hip extension bilaterally.     Time  8    Period  Weeks    Status  On-going      PT LONG TERM GOAL #4   Title  Patient will report no greater than a 2/10 pain in right hip over the course of 1 week period indicating better tolerance of functional activities.     Baseline  12/08/17: Patient reported a 4/10 pain within the course of the last week.     Time  8    Period  Weeks    Status  On-going            Plan - 12/08/17 1203    Clinical Impression Statement  This session performed a re-assessment of patient's progress toward goals. Patient achieved 2/3 of her short term goals. Patient's progress toward meeting all 4 long term goals is on-going. Patient has improved lower extremity strength in all deficient areas except with hip abduction strength. Patient continued to demonstrate a 5 times sit to stand score above 13 seconds this  session. The remainder of the session focused on improving patient's lower extremity strength functionally. Patient would benefit from continued skilled physical therapy in order to continue addressing deficits in lower extremity strength, balance, and overall functional mobility.     Rehab Potential  Good    Clinical Impairments Affecting Rehab Potential  Positive: Patient's positive attitude, patient's motivation. Negative: Chronicity of current problem     PT Frequency  2x / week    PT Duration  8 weeks    PT Treatment/Interventions  ADLs/Self Care Home Management;Aquatic Therapy;Cryotherapy;Electrical Stimulation;Moist Heat;Ultrasound;Gait training;DME Instruction;Stair training;Functional mobility training;Therapeutic activities;Therapeutic exercise;Balance training;Neuromuscular re-education;Patient/family education;Manual techniques;Passive range of motion    PT Next Visit Plan  Focus on hip abduction and hip extension strengthening. Balance has  improved, but continue to practice some. Next session add UE activity to tandem stance on foam,  begin tandem and retro gait on stable surface progressing to balance beam when able.     PT Home Exercise Plan  11/13/2017: SLS, bridge, abduction and mini-squats; 11/24/17 - RTG for clam shells, standing hip ext and abd no resistance; 11/26/17: March    Consulted and Agree with Plan of Care  Patient       Patient will benefit from skilled therapeutic intervention in order to improve the following deficits and impairments:  Abnormal gait, Decreased balance, Decreased mobility, Decreased endurance, Difficulty walking, Decreased range of motion, Improper body mechanics, Decreased activity tolerance, Decreased strength, Pain  Visit Diagnosis: Pain in right hip  Balance problems  Decreased range of motion  Decreased strength  Other symptoms and signs involving the musculoskeletal system  Other abnormalities of gait and mobility     Problem  List Patient Active Problem List   Diagnosis Date Noted  . Effusion of knee joint 10/05/2014  . Acute blood loss anemia 12/22/2012  . Closed hip fracture (HCC) 12/21/2012  . Intertrochanteric fracture of right hip (HCC) 12/21/2012   Angela Carrow PT, DPT 12:10 PM, 12/08/17 [phone]  [facility] [address] [place], [place], [address] Phone: [phone]   Fax:  [phone]  Name: Angela [name] MRN: [id number] Date of Birth: 07/26/1941

## 2017-12-09 ENCOUNTER — Telehealth: Payer: Self-pay | Admitting: Orthopedic Surgery

## 2017-12-09 ENCOUNTER — Telehealth (HOSPITAL_COMMUNITY): Payer: Self-pay | Admitting: Physical Therapy

## 2017-12-09 ENCOUNTER — Telehealth (HOSPITAL_COMMUNITY): Payer: Self-pay | Admitting: Internal Medicine

## 2017-12-09 NOTE — Telephone Encounter (Signed)
3/6/19pt left a message that yesterday as she was getting out of a chair something in her hip snapped and is wanting to put therapy on hold until she sees Dr. Romeo AppleHarrison

## 2017-12-09 NOTE — Telephone Encounter (Signed)
Patient called to report that her hip was feeling fine after therapy, but that at one point when she was home and stood up her hip began to feel a stabbing pain. Patient stated it was difficult to walk. However, patient reported that she is able to put weight on her leg and walk some at this point. Therapist advised patient that if she is unable to place weight on the leg or is having very severe pain, she may want to seek emergency care. Patient reported that she is to see her primary care physician on Friday and therapist asked patient to follow-up by calling the clinic regarding what her physician says. Patient reported understanding.   Verne CarrowMacy Asiyah Pineau PT, DPT 7:37 PM, 12/09/17 [phone]

## 2017-12-09 NOTE — Telephone Encounter (Signed)
Patient called, relays that the right hip, which Dr Romeo AppleHarrison performed surgery 12/21/12 (intramedullary nail). States that her primary care, Dr Margo AyeHall, has ordered physical therapy for same hip, and she has some concerns, mainly a recent pain and "different" sensation in the hip.  Aware that she will need to contact Dr Margo AyeHall also, as he is the ordering provider, as Dr Romeo AppleHarrison has not seen patient since 03/01/15.  We have scheduled patient for appointment with Dr Romeo AppleHarrison for evaluation.

## 2017-12-10 ENCOUNTER — Ambulatory Visit (HOSPITAL_COMMUNITY): Payer: Medicare Other | Admitting: Physical Therapy

## 2017-12-11 ENCOUNTER — Telehealth: Payer: Self-pay | Admitting: Orthopedic Surgery

## 2017-12-11 ENCOUNTER — Telehealth (HOSPITAL_COMMUNITY): Payer: Self-pay | Admitting: Internal Medicine

## 2017-12-11 NOTE — Telephone Encounter (Signed)
12/11/17 pt came by the office and Angela [name] asked if we could put therapy on hold until Angela [name] sees Dr. Romeo AppleHarrison on 3/26.  I made a note to call her the next day to find out what he told her about her hip.

## 2017-12-11 NOTE — Telephone Encounter (Signed)
Received a call from pt's PCP.  They stated that Ms. Angela [name] was there and they wanted to know if she could be seen.  I told her that we did not have anything this late as Dr. Romeo AppleHarrison usually does surgery around lunch time on Friday.  She said the patient was in their office wanting a sooner appointment than the one for 12/29/17 that she has been given.  At the end of conversation, Dr. Scharlene GlossHall's staff did mention that there could be a concern for some infection.  Would you please review and see if there may be an earlier appointment for this patient than the 26th or do you have any suggestions?  Thanks

## 2017-12-11 NOTE — Telephone Encounter (Signed)
Called back to patient; scheduled accordingly. Also contacted primary care, Dr. Benita StabileJohn Z Hall (via phone + faxed note)

## 2017-12-11 NOTE — Telephone Encounter (Signed)
1130 on the 12th

## 2017-12-14 DIAGNOSIS — Z9889 Other specified postprocedural states: Secondary | ICD-10-CM | POA: Insufficient documentation

## 2017-12-14 DIAGNOSIS — Z8781 Personal history of (healed) traumatic fracture: Secondary | ICD-10-CM

## 2017-12-15 ENCOUNTER — Ambulatory Visit: Payer: Medicare Other | Admitting: Orthopedic Surgery

## 2017-12-15 ENCOUNTER — Ambulatory Visit (INDEPENDENT_AMBULATORY_CARE_PROVIDER_SITE_OTHER): Payer: Medicare Other

## 2017-12-15 ENCOUNTER — Encounter (HOSPITAL_COMMUNITY): Payer: Medicare Other | Admitting: Physical Therapy

## 2017-12-15 VITALS — BP 134/89 | HR 75 | Ht 61.0 in | Wt 134.0 lb

## 2017-12-15 DIAGNOSIS — Z967 Presence of other bone and tendon implants: Secondary | ICD-10-CM

## 2017-12-15 DIAGNOSIS — Z8781 Personal history of (healed) traumatic fracture: Secondary | ICD-10-CM | POA: Diagnosis not present

## 2017-12-15 DIAGNOSIS — M1611 Unilateral primary osteoarthritis, right hip: Secondary | ICD-10-CM | POA: Diagnosis not present

## 2017-12-15 DIAGNOSIS — Z9889 Other specified postprocedural states: Secondary | ICD-10-CM

## 2017-12-15 NOTE — Progress Notes (Signed)
Progress Note   Patient ID: Angela [name], female   DOB: 01-07-1941, 77 y.o.   MRN: [id number]  Chief Complaint  Patient presents with  . Hip Pain    Right hip pain, had hip surgery 12-21-12.    77 year old status post inotrope fracture treated with gamma nail in 2014 presents with acute pain right hip groin lower back times 1-2 weeks.  Patient got up from a seated position had acute searing pain in the right hip felt like it did when she broke it she went to the primary care doctor's office for evaluation and treatment they referred her here for further evaluation  She now complains of mild pain usually takes 1 Aleve at night right lateral hip with minimal groin pain associated with some stiffness in the right hip and symptoms have been present now for a couple of weeks in terms of the severe pain which is resolved and then the other discomfort now for several weeks  No history of fever chills numbness tingling she does note that she has difficulty with nail care and putting her socks on she has undergone physical therapy     Review of Systems  Constitutional: Negative for fever.  Musculoskeletal: Positive for back pain and joint pain.  Skin: Negative.  Negative for itching and rash.  Neurological: Negative for sensory change.   No outpatient medications have been marked as taking for the 12/15/17 encounter (Office Visit) with Vickki HearingHarrison, Daylynn Stumpp E, MD.    Past Medical History:  Diagnosis Date  . Clostridium difficile infection 2010  . Hip fx (HCC)   . Use of cane as ambulatory aid      No Known Allergies  BP 134/89   Pulse 75   Ht 5\' 1"  (1.549 m)   Wt 134 lb (60.8 kg)   BMI 25.32 kg/m    Physical Exam  Constitutional: She is oriented to person, place, and time. She appears well-developed and well-nourished.  Neurological: She is alert and oriented to person, place, and time.  Psychiatric: She has a normal mood and affect. Judgment normal.  Vitals reviewed.   Right  Hip Exam   Tenderness  The patient is experiencing no tenderness.   Range of Motion  Flexion: 120  External rotation: abnormal  Internal rotation: abnormal   Muscle Strength  The patient has normal right hip strength.  Tests  FABER: negative  Other  Erythema: absent Sensation: normal Pulse: present  Comments:  HIP STABILITY NORMAL    Left Hip Exam  Left hip exam is normal.  Tenderness  The patient is experiencing no tenderness.   Range of Motion  The patient has normal left hip ROM. Abduction: normal  Adduction: normal  Extension: normal  Flexion: 130  External rotation: normal  Internal rotation: normal   Muscle Strength  The patient has normal left hip strength.   Tests  FABER: negative  Other  Erythema: absent Sensation: normal Pulse: present  Comments:  HIP STABILITY NORMAL        Medical decision-making  Imaging:   Independent interpretation of the x-rays today ordered in the office show stable gamma nail with osteoarthritis grade 2 right hip no complications of the fixation device  Encounter Diagnoses  Name Primary?  . S/P ORIF (open reduction internal fixation) fracture rIght hip IM Nail 12/17/12   . Primary osteoarthritis of right hip Yes    Assessment and plan  The patient is only taking Aleve on an occasional basis she is having some  trouble with nail care and putting her socks and shoes on but she is very functional and agrees to delay hip replacement surgery which will require hardware removal and extended length implant until she has more pain   Fuller Canada, MD 12/15/2017 11:31 AM

## 2017-12-15 NOTE — Patient Instructions (Addendum)
Total Hip Replacement Total hip replacement is a surgery to replace your damaged hip joint. Your hip joint is replaced with a man-made (artificial) hip joint. This man-made hip joint is called a prosthesis. This surgery is done to lessen pain and improve movement. What happens before the procedure?  Do not eat or drink anything after midnight on the night before the procedure or as told by your doctor.  Ask your doctor about: ? Changing or stopping your normal medicines. This is important if you take diabetes medicines or blood thinners. ? Taking aspirin or ibuprofen medicines. These thin your blood. Do not take these medicines if your doctor tells you not to.  Plan to have someone take you home after the procedure.  Ask your health care team how your surgery site will be marked.  You may be given medicines that kill germs (antibiotics) to help prevent infection. What happens during the procedure?  To help prevent infection: ? Your health care team will wash or sanitize their hands. ? Your skin will be washed with soap.  An IV tube will be put into one of your veins.  You will be given one or more of the following: ? A medicine that makes you relaxed (sedative). ? A medicine that makes you fall asleep (general anesthetic). ? A medicine that numbs your body below the waist (spinal anesthetic).  A cut (incision) will be made in your hip. Your surgeon will take out any damaged parts of your hip joint.  Your surgeon will then: ? Put a man-made hip joint into your pelvic bone. Screws may be used to keep the hip joint in place. ? Take out the damaged ball of your thigh bone (femur). A man-made ball on a metal pole will replace the damaged ball. ? The ball will be put into the new socket to make a new hip joint. Your hip joint will be checked to see if it moves as it should. ? Close the cut and place a bandage over it. What happens after the procedure?  You will stay in a recovery area  until your medicines wear off.  Your nurse will monitor your vital signs. These include: ? Your pulse. ? Your blood pressure.  Once you are doing okay, you will be taken to your hospital room.  You may be told to take actions to help prevent blood clots. These may include: ? Walking soon after surgery with someone helping you. Moving around helps to improve blood flow. ? Taking medicines to thin your blood (anticoagulants). ? Wearing special socks (compression stockings) or using other types of devices.  You will do exercise therapy (physical therapy) until you are doing well. Your doctor will tell you when you are well enough to go home. This information is not intended to replace advice given to you by your health care provider. Make sure you discuss any questions you have with your health care provider. Document Released: 12/15/2011 Document Revised: 05/26/2016 Document Reviewed: 11/23/2013 Elsevier Interactive Patient Education  2018 [employer]  

## 2017-12-17 ENCOUNTER — Encounter (HOSPITAL_COMMUNITY): Payer: Medicare Other | Admitting: Physical Therapy

## 2017-12-21 ENCOUNTER — Encounter (HOSPITAL_COMMUNITY): Payer: Self-pay

## 2017-12-21 ENCOUNTER — Ambulatory Visit (HOSPITAL_COMMUNITY): Payer: Medicare Other

## 2017-12-21 DIAGNOSIS — M25551 Pain in right hip: Secondary | ICD-10-CM | POA: Diagnosis not present

## 2017-12-21 DIAGNOSIS — M2560 Stiffness of unspecified joint, not elsewhere classified: Secondary | ICD-10-CM

## 2017-12-21 DIAGNOSIS — R29898 Other symptoms and signs involving the musculoskeletal system: Secondary | ICD-10-CM

## 2017-12-21 DIAGNOSIS — R531 Weakness: Secondary | ICD-10-CM

## 2017-12-21 DIAGNOSIS — R2689 Other abnormalities of gait and mobility: Secondary | ICD-10-CM

## 2017-12-21 NOTE — Therapy (Signed)
[facility] [address] [place], [place], [address] Phone: [phone]   Fax:  [phone]  Physical Therapy Treatment  Patient Details  Name: Angela GrainSuzanne F Croft MRN: [id number] Date of Birth: July 19, 1941 Referring Provider: Roe RutherfordKeatts, Courtney NP   Encounter Date: 12/21/2017  PT End of Session - 12/21/17 0900    Visit Number  9    Number of Visits  17    Date for PT Re-Evaluation  01/06/18    Authorization Type  [employer] Medicare    Authorization Time Period  11/11/17 to 01/06/18    Authorization - Visit Number  9    Authorization - Number of Visits  17    PT Start Time  0900    PT Stop Time  0941    PT Time Calculation (min)  41 min    Activity Tolerance  Patient tolerated treatment well;No increased pain    Behavior During Therapy  WFL for tasks assessed/performed       Past Medical History:  Diagnosis Date  . Clostridium difficile infection 2010  . Hip fx (HCC)   . Use of cane as ambulatory aid     Past Surgical History:  Procedure Laterality Date  . INTRAMEDULLARY (IM) NAIL INTERTROCHANTERIC Right 12/21/2012   Procedure: INTRAMEDULLARY (IM) NAIL INTERTROCHANTRIC;  Surgeon: Vickki HearingStanley E Harrison, MD;  Location: [facility];  Service: Orthopedics;  Laterality: Right;  . SKIN BIOPSY     3x 2013  . TONSILLECTOMY      There were no vitals filed for this visit.  Subjective Assessment - 12/21/17 0901    Subjective  Pt states that she stood up a few weeks ago and she felt a sharp pop. She has not been to therapy since this and has had a f/u with Dr. Romeo AppleHarrison. She states that he told her that nothing was dislodged, everything is where it needs to be with regard to the hardware. Next stop is a THA whenever pt is ready, which she states will probably not be until the summer. She is still having R hip pain.     How long can you sit comfortably?  10-15 minutes on a hard surface    How long can you stand comfortably?  30 minutes    How long can you walk  comfortably?  Difficulty with getting up and getting moving; 30 minutes     Patient Stated Goals  To be able to bend over far enough to the right to put on shoes and socks    Currently in Pain?  Yes    Pain Score  4     Pain Location  Hip    Pain Orientation  Right    Pain Descriptors / Indicators  Sore;Aching    Pain Type  Chronic pain    Pain Onset  More than a month ago    Pain Frequency  Intermittent    Aggravating Factors   sitting or standing for too long    Pain Relieving Factors  aleve    Effect of Pain on Daily Activities  works through the pain    Multiple Pain Sites  No         [facility] Adult PT Treatment/Exercise - 12/21/17 0001      Knee/Hip Exercises: Stretches   LobbyistQuad Stretch  Right;3 reps;30 seconds;Limitations    LobbyistQuad Stretch Limitations  prone with rope    Hip Flexor Stretch  Right;3 reps;30 seconds;Limitations    Hip Flexor Stretch Limitations  modified  thomas test stretch off EOB      Knee/Hip Exercises: Seated   Marching  Right;10 reps    Marching Limitations  on dyna disc      Knee/Hip Exercises: Supine   Bridges  Both;15 reps    Straight Leg Raises  Right;10 reps      Manual Therapy   Manual Therapy  Soft tissue mobilization;Myofascial release    Manual therapy comments  completed separate rest of treatment    Soft tissue mobilization  STM/cross friction to R common hip flexor insertion, proximal rectus femoris    Myofascial Release  MFR/ischemic release to R common hip flexor insertion, proximal rectus femoris          PT Education - 12/21/17 0905    Education provided  Yes    Education Details  exercise technique    Person(s) Educated  Patient    Methods  Explanation;Demonstration    Comprehension  Verbalized understanding;Returned demonstration       PT Short Term Goals - 12/08/17 1018      PT SHORT TERM GOAL #1   Title  Patient will demonstrate understanding and report regular compliance with HEP in order to improve patient's functional  mobility.     Time  4    Period  Weeks    Status  Achieved      PT SHORT TERM GOAL #2   Title  Patient will demonstrate improved hip extension, abduction, and flexion strength of 1/2 MMT grade bilaterally as evidence of improved hip strength in order to assist with performing functional mobility.     Baseline  12/08/17: Patient has improved by 1/2 MMT grade in all except hip extension bilaterally    Time  4    Period  Weeks    Status  On-going      PT SHORT TERM GOAL #3   Title  Patient will demonstrate ability to maintain single limb stance on the right lower extremity for 5 seconds as evidence of improved balance and improved ability to perform functional activities requiring single limb stance such as stair navigation.     Baseline  12/08/17: Patient maintained SLS for 16 seconds on the right lower extremity.     Time  4    Period  Weeks    Status  Achieved        PT Long Term Goals - 12/08/17 1019      PT LONG TERM GOAL #1   Title  Patient will demonstrate an improvement on FOTO of 10% as evidence of patient's perceived improvement in functional mobility.     Baseline  12/08/17: Patient demonstrated an improvement of 3%.     Time  8    Period  Weeks    Status  On-going      PT LONG TERM GOAL #2   Title  Patient will perform 5x sit to stand in 12 seconds or less and without visible weight shift to the left lower extremity indicating improvement in ability to perform transitions and decrease risk of falling.     Baseline  12/08/17: Patient performed 5x sit to stand in 13.33 seconds this session with weight slightly shifted to the left lower extremity.     Time  8    Period  Weeks    Status  On-going      PT LONG TERM GOAL #3   Title  Patient will demonstrate improvement in MMT of 1 grade for hip extension in bilateral hips as evidence  of improved hip extension strength to assist performance of gait and other functional activities.     Baseline  12/08/17: Patient demonstrated  improvement of 1/2 MMT grade for hip extension bilaterally.     Time  8    Period  Weeks    Status  On-going      PT LONG TERM GOAL #4   Title  Patient will report no greater than a 2/10 pain in right hip over the course of 1 week period indicating better tolerance of functional activities.     Baseline  12/08/17: Patient reported a 4/10 pain within the course of the last week.     Time  8    Period  Weeks    Status  On-going            Plan - 12/21/17 0940    Clinical Impression Statement  Pt returns to therapy after approximately 2 weeks off due to pt feeling a sharp pain after standing up. She f/u with Dr. Romeo Apple who stated that all hardware was intact and was not damaged. Pt palpated area that pt was c/o and pt noted to have significant increased soft tissue restrictions of R hip flexors and rectus femoris. Pt stated that palpation to this area recreated her pain. Addressed this with STM and stretching to the common hip flexors and rectus femoris. Added these to pt's HEP. Rest of session focused on isolated hip strengthening. Resume dynamic hip exercises next visit. No pain at EOS.    Rehab Potential  Good    Clinical Impairments Affecting Rehab Potential  Positive: Patient's positive attitude, patient's motivation. Negative: Chronicity of current problem     PT Frequency  2x / week    PT Duration  8 weeks    PT Treatment/Interventions  ADLs/Self Care Home Management;Aquatic Therapy;Cryotherapy;Electrical Stimulation;Moist Heat;Ultrasound;Gait training;DME Instruction;Stair training;Functional mobility training;Therapeutic activities;Therapeutic exercise;Balance training;Neuromuscular re-education;Patient/family education;Manual techniques;Passive range of motion    PT Next Visit Plan  STM/MFR R hip flexors and rectus femoris; continue hip flexion strengthening; Focus on hip abduction and hip extension strengthening. Balance has improved, but continue to practice some. Next session add  UE activity to tandem stance on foam,  begin tandem and retro gait on stable surface progressing to balance beam when able.     PT Home Exercise Plan  11/13/2017: SLS, bridge, abduction and mini-squats; 11/24/17 - RTG for clam shells, standing hip ext and abd no resistance; 11/26/17: March; 3/18: modified thomas test stretch for hip flexors, prone quad stretch with rope    Consulted and Agree with Plan of Care  Patient       Patient will benefit from skilled therapeutic intervention in order to improve the following deficits and impairments:  Abnormal gait, Decreased balance, Decreased mobility, Decreased endurance, Difficulty walking, Decreased range of motion, Improper body mechanics, Decreased activity tolerance, Decreased strength, Pain  Visit Diagnosis: Pain in right hip  Balance problems  Decreased range of motion  Decreased strength  Other symptoms and signs involving the musculoskeletal system  Other abnormalities of gait and mobility     Problem List Patient Active Problem List   Diagnosis Date Noted  . S/P ORIF (open reduction internal fixation) fracture rIght hip IM Nail 12/21/12 12/14/2017  . Effusion of knee joint 10/05/2014  . Acute blood loss anemia 12/22/2012  . Closed hip fracture (HCC) 12/21/2012  . Intertrochanteric fracture of right hip (HCC) 12/21/2012       Jac Canavan PT, DPT  [facility]  [facility] [address] [place], [place], [address] Phone: [phone]   Fax:  [phone]  Name: MALEIAH DULA MRN: [id number] Date of Birth: 08-17-1941

## 2017-12-21 NOTE — Patient Instructions (Signed)
  HIP FLEXOR STRETCH  While lying on a table or high bed, let the affected leg lower towards the floor until a stretch is felt along the front of your thigh.  Perform 1-2x/day, 3-5 stretches holding for 30-60 seconds on the Right leg  \ Prone Quad Stretch  Lie down flat on your stomach. Wrap a strap (belt, towel, dog leash) around the top of one of your feet and pull the strap across your opposite shoulder so that your knee starts to curl up to your body. Pull until a stretch is felt across the front of your thigh.     Perform 1-2x/day, 3-5 stretches holding for 30-60 seconds on the Right leg

## 2017-12-22 ENCOUNTER — Encounter (HOSPITAL_COMMUNITY): Payer: Medicare Other | Admitting: Physical Therapy

## 2017-12-23 ENCOUNTER — Ambulatory Visit (HOSPITAL_COMMUNITY): Payer: Medicare Other | Admitting: Physical Therapy

## 2017-12-23 ENCOUNTER — Encounter (HOSPITAL_COMMUNITY): Payer: Self-pay | Admitting: Physical Therapy

## 2017-12-23 DIAGNOSIS — M25551 Pain in right hip: Secondary | ICD-10-CM | POA: Diagnosis not present

## 2017-12-23 DIAGNOSIS — R2689 Other abnormalities of gait and mobility: Secondary | ICD-10-CM

## 2017-12-23 NOTE — Therapy (Signed)
[facility] [address] [place], [place], [address] Phone: [phone]   Fax:  [phone]  Physical Therapy Treatment  Patient Details  Name: Angela [name] MRN: [id number] Date of Birth: March 20, 1941 Referring Provider: Roe RutherfordKeatts, Courtney NP   Encounter Date: 12/23/2017  PT End of Session - 12/23/17 0906    Visit Number  10    Number of Visits  17    Date for PT Re-Evaluation  01/06/18    Authorization Type  [employer] Medicare    Authorization Time Period  11/11/17 to 01/06/18    Authorization - Visit Number  10    Authorization - Number of Visits  17    PT Start Time  0901 session started by Becky Saxasey Cockerham, PTA 9:01-9:14    PT Stop Time  0950    PT Time Calculation (min)  49 min    Activity Tolerance  Patient tolerated treatment well;No increased pain    Behavior During Therapy  WFL for tasks assessed/performed       Past Medical History:  Diagnosis Date  . Clostridium difficile infection 2010  . Hip fx (HCC)   . Use of cane as ambulatory aid     Past Surgical History:  Procedure Laterality Date  . INTRAMEDULLARY (IM) NAIL INTERTROCHANTERIC Right 12/21/2012   Procedure: INTRAMEDULLARY (IM) NAIL INTERTROCHANTRIC;  Surgeon: Vickki HearingStanley E Harrison, MD;  Location: [facility];  Service: Orthopedics;  Laterality: Right;  . SKIN BIOPSY     3x 2013  . TONSILLECTOMY      There were no vitals filed for this visit.  Subjective Assessment - 12/23/17 0902    Subjective  Pt stated she continues to have some pain though reports low pain scale maybe 2-3/10 soreness.  Stated most difficult task with putting on socks and shoes.  Has began new stretch to address tightness in hip at home.      Patient Stated Goals  To be able to bend over far enough to the right to put on shoes and socks    Currently in Pain?  Yes    Pain Score  3     Pain Location  Hip    Pain Orientation  Right    Pain Descriptors / Indicators  Sore;Aching    Pain Type  Chronic pain     Pain Onset  More than a month ago    Pain Frequency  Intermittent    Aggravating Factors   sitting or standing for too long    Pain Relieving Factors  aleve    Effect of Pain on Daily Activities  works through the pain                      [facility]/Ex[facility] - 12/23/17 0001      Knee/Hip Exercises: Stretches   Hip Flexor Stretch  Right;3 reps;30 seconds;Limitations    Hip Flexor Stretch Limitations  modified thomas test stretch off EOB      Knee/Hip Exercises: Standing   Forward Lunges  Both;15 reps    Forward Lunges Limitations  onto floor no HHA    Hip Abduction  Both;15 reps    Abduction Limitations  3# each side    Hip Extension  Both;15 reps    Extension Limitations  3# each side    Lateral Step Up  Both;15 reps;Step Height: 6";Hand Hold: 0    Forward Step Up  Both;15 reps;Step Height: 6";Hand Hold: 1;Limitations    Forward  Step Up Limitations  with opposite knee drive    Other Standing Knee Exercises  tandem stance on foam with UE flexion using 2# bar 15 reps    Other Standing Knee Exercises  balance beam:  tandem, retro tandem, side stepping 2RT each      Manual Therapy   Manual Therapy  Soft tissue mobilization;Myofascial release    Manual therapy comments  completed separate rest of treatment    Soft tissue mobilization  STM/cross friction to R common hip flexor insertion, proximal rectus femoris    Myofascial Release  MFR/ischemic release to R common hip flexor insertion, proximal rectus femoris               PT Short Term Goals - 12/08/17 1018      PT SHORT TERM GOAL #1   Title  Patient will demonstrate understanding and report regular compliance with HEP in order to improve patient's functional mobility.     Time  4    Period  Weeks    Status  Achieved      PT SHORT TERM GOAL #2   Title  Patient will demonstrate improved hip extension, abduction, and flexion strength of 1/2 MMT grade bilaterally as evidence of improved  hip strength in order to assist with performing functional mobility.     Baseline  12/08/17: Patient has improved by 1/2 MMT grade in all except hip extension bilaterally    Time  4    Period  Weeks    Status  On-going      PT SHORT TERM GOAL #3   Title  Patient will demonstrate ability to maintain single limb stance on the right lower extremity for 5 seconds as evidence of improved balance and improved ability to perform functional activities requiring single limb stance such as stair navigation.     Baseline  12/08/17: Patient maintained SLS for 16 seconds on the right lower extremity.     Time  4    Period  Weeks    Status  Achieved        PT Long Term Goals - 12/08/17 1019      PT LONG TERM GOAL #1   Title  Patient will demonstrate an improvement on FOTO of 10% as evidence of patient's perceived improvement in functional mobility.     Baseline  12/08/17: Patient demonstrated an improvement of 3%.     Time  8    Period  Weeks    Status  On-going      PT LONG TERM GOAL #2   Title  Patient will perform 5x sit to stand in 12 seconds or less and without visible weight shift to the left lower extremity indicating improvement in ability to perform transitions and decrease risk of falling.     Baseline  12/08/17: Patient performed 5x sit to stand in 13.33 seconds this session with weight slightly shifted to the left lower extremity.     Time  8    Period  Weeks    Status  On-going      PT LONG TERM GOAL #3   Title  Patient will demonstrate improvement in MMT of 1 grade for hip extension in bilateral hips as evidence of improved hip extension strength to assist performance of gait and other functional activities.     Baseline  12/08/17: Patient demonstrated improvement of 1/2 MMT grade for hip extension bilaterally.     Time  8    Period  Weeks  Status  On-going      PT LONG TERM GOAL #4   Title  Patient will report no greater than a 2/10 pain in right hip over the course of 1 week period  indicating better tolerance of functional activities.     Baseline  12/08/17: Patient reported a 4/10 pain within the course of the last week.     Time  8    Period  Weeks    Status  On-going            Plan - 12/23/17 1112    Clinical Impression Statement  Session started by Becky Sax, PTA.  Initiated balance beam actvities retro tandem, tandem and side stepping all without difficulty or LOB.  UE task also added to tandem on foam without challenge. Resumed hip exericses held last session.  Able to increase difficulty of lunge by completing on flat surface and increase reps of some activities.  Pt continues to require cues for general form when completing exercises.   Pt is overall progressing well with most pain/limitation in Rt hip flexor area.  Pt is respornding well to manual and is decreasing this and increasing function. General tightness in hip flexors and rectus femoris region.   Pt reported no pain at EOS.    Rehab Potential  Good    Clinical Impairments Affecting Rehab Potential  Positive: Patient's positive attitude, patient's motivation. Negative: Chronicity of current problem     PT Frequency  2x / week    PT Duration  8 weeks    PT Treatment/Interventions  ADLs/Self Care Home Management;Aquatic Therapy;Cryotherapy;Electrical Stimulation;Moist Heat;Ultrasound;Gait training;DME Instruction;Stair training;Functional mobility training;Therapeutic activities;Therapeutic exercise;Balance training;Neuromuscular re-education;Patient/family education;Manual techniques;Passive range of motion    PT Next Visit Plan  STM/MFR R hip flexors and rectus femoris; continue hip flexion strengthening; Focus on hip abduction and hip extension strengthening. Balance has improved, but continue to practice some. Next session add hurdles over balance beam.     PT Home Exercise Plan  11/13/2017: SLS, bridge, abduction and mini-squats; 11/24/17 - RTG for clam shells, standing hip ext and abd no  resistance; 11/26/17: March; 3/18: modified thomas test stretch for hip flexors, prone quad stretch with rope    Consulted and Agree with Plan of Care  Patient       Patient will benefit from skilled therapeutic intervention in order to improve the following deficits and impairments:  Abnormal gait, Decreased balance, Decreased mobility, Decreased endurance, Difficulty walking, Decreased range of motion, Improper body mechanics, Decreased activity tolerance, Decreased strength, Pain  Visit Diagnosis: Pain in right hip  Balance problems     Problem List Patient Active Problem List   Diagnosis Date Noted  . S/P ORIF (open reduction internal fixation) fracture rIght hip IM Nail 12/21/12 12/14/2017  . Effusion of knee joint 10/05/2014  . Acute blood loss anemia 12/22/2012  . Closed hip fracture (HCC) 12/21/2012  . Intertrochanteric fracture of right hip (HCC) 12/21/2012   Lurena Nida, PTA/CLT [phone]  Lurena Nida 12/23/2017, 11:18 AM  [facility] [address] [place], [place], [address] Phone: [phone]   Fax:  [phone]  Name: Angela [name] MRN: [id number] Date of Birth: 02-10-1941

## 2017-12-24 ENCOUNTER — Encounter (HOSPITAL_COMMUNITY): Payer: Medicare Other | Admitting: Physical Therapy

## 2017-12-28 ENCOUNTER — Other Ambulatory Visit (HOSPITAL_COMMUNITY): Payer: Self-pay | Admitting: Internal Medicine

## 2017-12-28 DIAGNOSIS — Z1231 Encounter for screening mammogram for malignant neoplasm of breast: Secondary | ICD-10-CM

## 2017-12-29 ENCOUNTER — Ambulatory Visit: Payer: Medicare Other | Admitting: Orthopedic Surgery

## 2017-12-29 ENCOUNTER — Ambulatory Visit (HOSPITAL_COMMUNITY): Payer: Medicare Other

## 2017-12-29 ENCOUNTER — Encounter (HOSPITAL_COMMUNITY): Payer: Self-pay

## 2017-12-29 ENCOUNTER — Encounter (HOSPITAL_COMMUNITY): Payer: Medicare Other

## 2017-12-29 DIAGNOSIS — R29898 Other symptoms and signs involving the musculoskeletal system: Secondary | ICD-10-CM

## 2017-12-29 DIAGNOSIS — M25551 Pain in right hip: Secondary | ICD-10-CM

## 2017-12-29 DIAGNOSIS — R2689 Other abnormalities of gait and mobility: Secondary | ICD-10-CM

## 2017-12-29 DIAGNOSIS — R531 Weakness: Secondary | ICD-10-CM

## 2017-12-29 DIAGNOSIS — M2560 Stiffness of unspecified joint, not elsewhere classified: Secondary | ICD-10-CM

## 2017-12-29 NOTE — Therapy (Signed)
[facility] [address] [place], [place], [address] Phone: [phone]   Fax:  [phone]  Physical Therapy Treatment  Patient Details  Name: Angela [name] MRN: [id number] Date of Birth: [birth year] Referring Provider: Roe Rutherford NP   Encounter Date: 12/29/2017  PT End of Session - 12/29/17 0859    Visit Number  11    Number of Visits  17    Date for PT Re-Evaluation  01/06/18    Authorization Type  [employer] Medicare    Authorization Time Period  11/11/17 to 01/06/18    Authorization - Visit Number  11    Authorization - Number of Visits  17    PT Start Time  0856    PT Stop Time  0940    PT Time Calculation (min)  44 min    Activity Tolerance  Patient tolerated treatment well;No increased pain    Behavior During Therapy  WFL for tasks assessed/performed       Past Medical History:  Diagnosis Date  . Clostridium difficile infection 2010  . Hip fx (HCC)   . Use of cane as ambulatory aid     Past Surgical History:  Procedure Laterality Date  . INTRAMEDULLARY (IM) NAIL INTERTROCHANTERIC Right 12/21/2012   Procedure: INTRAMEDULLARY (IM) NAIL INTERTROCHANTRIC;  Surgeon: Vickki Hearing, MD;  Location: [facility];  Service: Orthopedics;  Laterality: Right;  . SKIN BIOPSY     3x 2013  . TONSILLECTOMY      There were no vitals filed for this visit.  Subjective Assessment - 12/29/17 0859    Subjective  Pt reports she is having some stiffness and discomfort this morning. She thinks the cold weather is contributing to her stiffness. Rates her discomfort as 4/10.    Patient Stated Goals  To be able to bend over far enough to the right to put on shoes and socks    Currently in Pain?  Yes    Pain Score  4     Pain Location  Hip    Pain Orientation  Right;Lateral    Pain Descriptors / Indicators  Sore;Discomfort;Aching    Pain Type  Chronic pain    Pain Onset  More than a month ago    Pain Frequency  Intermittent    Aggravating  Factors   sitting or standing for too long    Pain Relieving Factors  aleve    Effect of Pain on Daily Activities  works through the pain           [facility] Adult PT Treatment/Exercise - 12/29/17 0001      Knee/Hip Exercises: Stretches   Lobbyist  Right;3 reps;30 seconds;Limitations    Lobbyist Limitations  prone with rope    Hip Flexor Stretch  Right;3 reps;30 seconds;Limitations    Hip Flexor Stretch Limitations  modified thomas test stretch off EOB      Knee/Hip Exercises: Standing   Forward Step Up  Both;15 reps;Step Height: 6";Limitations    Forward Step Up Limitations  with opposite knee drive    SLS with Vectors  bil SLS +palov press with RTB x10 reps each    Walking with Sports Cord  sidestepping x2RT and fwd monster walks x1RT with RTB, 16ft each    Gait Training  fwd tandem gait and sidestepping over 6" hurdles on balance beam x1RT each      Manual Therapy   Manual Therapy  Myofascial release    Manual therapy comments  completed separate rest of treatment    Myofascial Release  MFR/ischemic release to R common hip flexor insertion, proximal rectus femoris, R TFL             PT Education - 12/29/17 0936    Education provided  Yes    Education Details  exercise technique; beneifts and risks of trigger point dry needling    Person(s) Educated  Patient    Methods  Explanation;Demonstration;Handout    Comprehension  Verbalized understanding       PT Short Term Goals - 12/08/17 1018      PT SHORT TERM GOAL #1   Title  Patient will demonstrate understanding and report regular compliance with HEP in order to improve patient's functional mobility.     Time  4    Period  Weeks    Status  Achieved      PT SHORT TERM GOAL #2   Title  Patient will demonstrate improved hip extension, abduction, and flexion strength of 1/2 MMT grade bilaterally as evidence of improved hip strength in order to assist with performing functional mobility.     Baseline  12/08/17:  Patient has improved by 1/2 MMT grade in all except hip extension bilaterally    Time  4    Period  Weeks    Status  On-going      PT SHORT TERM GOAL #3   Title  Patient will demonstrate ability to maintain single limb stance on the right lower extremity for 5 seconds as evidence of improved balance and improved ability to perform functional activities requiring single limb stance such as stair navigation.     Baseline  12/08/17: Patient maintained SLS for 16 seconds on the right lower extremity.     Time  4    Period  Weeks    Status  Achieved        PT Long Term Goals - 12/08/17 1019      PT LONG TERM GOAL #1   Title  Patient will demonstrate an improvement on FOTO of 10% as evidence of patient's perceived improvement in functional mobility.     Baseline  12/08/17: Patient demonstrated an improvement of 3%.     Time  8    Period  Weeks    Status  On-going      PT LONG TERM GOAL #2   Title  Patient will perform 5x sit to stand in 12 seconds or less and without visible weight shift to the left lower extremity indicating improvement in ability to perform transitions and decrease risk of falling.     Baseline  12/08/17: Patient performed 5x sit to stand in 13.33 seconds this session with weight slightly shifted to the left lower extremity.     Time  8    Period  Weeks    Status  On-going      PT LONG TERM GOAL #3   Title  Patient will demonstrate improvement in MMT of 1 grade for hip extension in bilateral hips as evidence of improved hip extension strength to assist performance of gait and other functional activities.     Baseline  12/08/17: Patient demonstrated improvement of 1/2 MMT grade for hip extension bilaterally.     Time  8    Period  Weeks    Status  On-going      PT LONG TERM GOAL #4   Title  Patient will report no greater than a 2/10 pain in right hip over the course  of 1 week period indicating better tolerance of functional activities.     Baseline  12/08/17: Patient  reported a 4/10 pain within the course of the last week.     Time  8    Period  Weeks    Status  On-going            Plan - 12/29/17 0941    Clinical Impression Statement  Continued with established POC this date focusing on hip strength and overall functional strength. Pt requiring min cues throughout session for proper exercise technique. Progressed pt to hurdles over balance beam; she was more challenged with sidestepping compared to fwd tandem over hurdles. She did well with palov press during SLS on firm and can be progressed to foam next visit. Ended with manual MFR to R hip flexor for continued pain control. Pt with palpable taut bands in common hip flexors and TFL; she continues to respond well to MFR AEB decreased pain following. PT educated on risk and benefits and provided handout on trigger point dry needling. Pt will decide if she wants to try it next session. 0.5/10 pain at EOS.    Rehab Potential  Good    Clinical Impairments Affecting Rehab Potential  Positive: Patient's positive attitude, patient's motivation. Negative: Chronicity of current problem     PT Frequency  2x / week    PT Duration  8 weeks    PT Treatment/Interventions  ADLs/Self Care Home Management;Aquatic Therapy;Cryotherapy;Electrical Stimulation;Moist Heat;Ultrasound;Gait training;DME Instruction;Stair training;Functional mobility training;Therapeutic activities;Therapeutic exercise;Balance training;Neuromuscular re-education;Patient/family education;Manual techniques;Passive range of motion    PT Next Visit Plan  Trigger point dry needling to hip flexors/rectus femoris/TFL; STM/MFR R hip flexors and rectus femoris; continue hip flexion strengthening; Focus on hip abduction and hip extension strengthening. Balance has improved, but continue to practice some. Next session add hurdles over balance beam.     PT Home Exercise Plan  11/13/2017: SLS, bridge, abduction and mini-squats; 11/24/17 - RTG for clam shells,  standing hip ext and abd no resistance; 11/26/17: March; 3/18: modified thomas test stretch for hip flexors, prone quad stretch with rope    Consulted and Agree with Plan of Care  Patient       Patient will benefit from skilled therapeutic intervention in order to improve the following deficits and impairments:  Abnormal gait, Decreased balance, Decreased mobility, Decreased endurance, Difficulty walking, Decreased range of motion, Improper body mechanics, Decreased activity tolerance, Decreased strength, Pain  Visit Diagnosis: Pain in right hip  Balance problems  Decreased range of motion  Decreased strength  Other symptoms and signs involving the musculoskeletal system  Other abnormalities of gait and mobility     Problem List Patient Active Problem List   Diagnosis Date Noted  . S/P ORIF (open reduction internal fixation) fracture rIght hip IM Nail 12/21/12 12/14/2017  . Effusion of knee joint 10/05/2014  . Acute blood loss anemia 12/22/2012  . Closed hip fracture (HCC) 12/21/2012  . Intertrochanteric fracture of right hip (HCC) 12/21/2012       Jac Canavan PT, DPT  [facility] [address] [place], [place], [address] Phone: [phone]   Fax:  [phone]  Name: Angela [name] MRN: [id number] Date of Birth: 1941/09/27

## 2017-12-29 NOTE — Patient Instructions (Signed)

## 2017-12-31 ENCOUNTER — Encounter (HOSPITAL_COMMUNITY): Payer: Self-pay

## 2017-12-31 ENCOUNTER — Ambulatory Visit (HOSPITAL_COMMUNITY): Payer: Medicare Other

## 2017-12-31 ENCOUNTER — Encounter (HOSPITAL_COMMUNITY): Payer: Medicare Other | Admitting: Physical Therapy

## 2017-12-31 DIAGNOSIS — R29898 Other symptoms and signs involving the musculoskeletal system: Secondary | ICD-10-CM

## 2017-12-31 DIAGNOSIS — R2689 Other abnormalities of gait and mobility: Secondary | ICD-10-CM

## 2017-12-31 DIAGNOSIS — M25551 Pain in right hip: Secondary | ICD-10-CM

## 2017-12-31 DIAGNOSIS — M2560 Stiffness of unspecified joint, not elsewhere classified: Secondary | ICD-10-CM

## 2017-12-31 DIAGNOSIS — R531 Weakness: Secondary | ICD-10-CM

## 2017-12-31 NOTE — Therapy (Signed)
[facility] [address] [place], [place], [address] Phone: [phone]   Fax:  [phone]  Physical Therapy Treatment  Patient Details  Name: Angela [name] MRN: [id number] Date of Birth: Oct 12, 1940 Referring Provider: Roe Rutherford NP   Encounter Date: 12/31/2017  PT End of Session - 12/31/17 1351    Visit Number  12    Number of Visits  17    Date for PT Re-Evaluation  01/06/18    Authorization Type  [employer] Medicare    Authorization Time Period  11/11/17 to 01/06/18    Authorization - Visit Number  12    Authorization - Number of Visits  17    PT Start Time  1348    PT Stop Time  1427    PT Time Calculation (min)  39 min    Activity Tolerance  Patient tolerated treatment well;No increased pain    Behavior During Therapy  WFL for tasks assessed/performed       Past Medical History:  Diagnosis Date  . Clostridium difficile infection 2010  . Hip fx (HCC)   . Use of cane as ambulatory aid     Past Surgical History:  Procedure Laterality Date  . INTRAMEDULLARY (IM) NAIL INTERTROCHANTERIC Right 12/21/2012   Procedure: INTRAMEDULLARY (IM) NAIL INTERTROCHANTRIC;  Surgeon: Vickki Hearing, MD;  Location: [facility];  Service: Orthopedics;  Laterality: Right;  . SKIN BIOPSY     3x 2013  . TONSILLECTOMY      There were no vitals filed for this visit.  Subjective Assessment - 12/31/17 1351    Subjective  Pt reports that she has a bruise following the manual last session. Overall her pain is improving.    Patient Stated Goals  To be able to bend over far enough to the right to put on shoes and socks    Currently in Pain?  No/denies    Pain Onset  More than a month ago          [facility] Adult PT Treatment/Exercise - 12/31/17 0001      Knee/Hip Exercises: Stretches   Hip Flexor Stretch  Right;3 reps;30 seconds;Limitations    Hip Flexor Stretch Limitations  modified thomas test stretch off EOB      Knee/Hip Exercises: Standing   Hip Flexion  Both;10 reps    Hip Flexion Limitations  on foam +pulldowns with GTB    SLS with Vectors  bil SLS +palov press with GTB x10 reps each    Gait Training  fwd tandem gait and sidestepping over 6" and 12" hurdles on balance beam x1RT each      Manual Therapy   Manual Therapy  Myofascial release    Manual therapy comments  completed separate rest of treatment    Myofascial Release  MFR/ischemic release to R common hip flexor insertion, proximal rectus femoris, R TFL           PT Education - 12/31/17 1402    Education provided  Yes    Education Details  exercise technique    Person(s) Educated  Patient    Methods  Explanation;Demonstration    Comprehension  Verbalized understanding;Returned demonstration       PT Short Term Goals - 12/08/17 1018      PT SHORT TERM GOAL #1   Title  Patient will demonstrate understanding and report regular compliance with HEP in order to improve patient's functional mobility.     Time  4    Period  Weeks    Status  Achieved      PT SHORT TERM GOAL #2   Title  Patient will demonstrate improved hip extension, abduction, and flexion strength of 1/2 MMT grade bilaterally as evidence of improved hip strength in order to assist with performing functional mobility.     Baseline  12/08/17: Patient has improved by 1/2 MMT grade in all except hip extension bilaterally    Time  4    Period  Weeks    Status  On-going      PT SHORT TERM GOAL #3   Title  Patient will demonstrate ability to maintain single limb stance on the right lower extremity for 5 seconds as evidence of improved balance and improved ability to perform functional activities requiring single limb stance such as stair navigation.     Baseline  12/08/17: Patient maintained SLS for 16 seconds on the right lower extremity.     Time  4    Period  Weeks    Status  Achieved        PT Long Term Goals - 12/08/17 1019      PT LONG TERM GOAL #1   Title  Patient will demonstrate an  improvement on FOTO of 10% as evidence of patient's perceived improvement in functional mobility.     Baseline  12/08/17: Patient demonstrated an improvement of 3%.     Time  8    Period  Weeks    Status  On-going      PT LONG TERM GOAL #2   Title  Patient will perform 5x sit to stand in 12 seconds or less and without visible weight shift to the left lower extremity indicating improvement in ability to perform transitions and decrease risk of falling.     Baseline  12/08/17: Patient performed 5x sit to stand in 13.33 seconds this session with weight slightly shifted to the left lower extremity.     Time  8    Period  Weeks    Status  On-going      PT LONG TERM GOAL #3   Title  Patient will demonstrate improvement in MMT of 1 grade for hip extension in bilateral hips as evidence of improved hip extension strength to assist performance of gait and other functional activities.     Baseline  12/08/17: Patient demonstrated improvement of 1/2 MMT grade for hip extension bilaterally.     Time  8    Period  Weeks    Status  On-going      PT LONG TERM GOAL #4   Title  Patient will report no greater than a 2/10 pain in right hip over the course of 1 week period indicating better tolerance of functional activities.     Baseline  12/08/17: Patient reported a 4/10 pain within the course of the last week.     Time  8    Period  Weeks    Status  On-going            Plan - 12/31/17 1427    Clinical Impression Statement  Pt presenting to therapy stating that she does not wish to have dry needling done this date. Therefore, PT performed MFR to common hip flexor insertion, TFL, and proximal rectus femoris. Rest of session focused on functional and dynamic strengthening of hips. Increased challenge by adding 12" hurdles along with 6" hurdles over balance beam. Pt progressing overall very nicely towards goals.     Rehab Potential  Good  Clinical Impairments Affecting Rehab Potential  Positive: Patient's  positive attitude, patient's motivation. Negative: Chronicity of current problem     PT Frequency  2x / week    PT Duration  8 weeks    PT Treatment/Interventions  ADLs/Self Care Home Management;Aquatic Therapy;Cryotherapy;Electrical Stimulation;Moist Heat;Ultrasound;Gait training;DME Instruction;Stair training;Functional mobility training;Therapeutic activities;Therapeutic exercise;Balance training;Neuromuscular re-education;Patient/family education;Manual techniques;Passive range of motion    PT Next Visit Plan  STM/MFR R hip flexors and rectus femoris; continue hip flexion strengthening; Focus on hip abduction and hip extension strengthening. progress functional and dynamic strength and progress balance.    PT Home Exercise Plan  11/13/2017: SLS, bridge, abduction and mini-squats; 11/24/17 - RTG for clam shells, standing hip ext and abd no resistance; 11/26/17: March; 3/18: modified thomas test stretch for hip flexors, prone quad stretch with rope    Consulted and Agree with Plan of Care  Patient       Patient will benefit from skilled therapeutic intervention in order to improve the following deficits and impairments:  Abnormal gait, Decreased balance, Decreased mobility, Decreased endurance, Difficulty walking, Decreased range of motion, Improper body mechanics, Decreased activity tolerance, Decreased strength, Pain  Visit Diagnosis: Pain in right hip  Balance problems  Decreased range of motion  Decreased strength  Other symptoms and signs involving the musculoskeletal system  Other abnormalities of gait and mobility     Problem List Patient Active Problem List   Diagnosis Date Noted  . S/P ORIF (open reduction internal fixation) fracture rIght hip IM Nail 12/21/12 12/14/2017  . Effusion of knee joint 10/05/2014  . Acute blood loss anemia 12/22/2012  . Closed hip fracture (HCC) 12/21/2012  . Intertrochanteric fracture of right hip (HCC) 12/21/2012       Jac Canavan PT,  DPT  [place] [facility] [address] [place], [place], [address] Phone: [phone]   Fax:  [phone]  Name: TERRY ABILA MRN: [id number] Date of Birth: 1941-05-16

## 2018-01-04 ENCOUNTER — Ambulatory Visit (HOSPITAL_COMMUNITY): Payer: Medicare Other | Attending: Adult Health Nurse Practitioner

## 2018-01-04 ENCOUNTER — Encounter (HOSPITAL_COMMUNITY): Payer: Self-pay

## 2018-01-04 DIAGNOSIS — M25551 Pain in right hip: Secondary | ICD-10-CM | POA: Diagnosis not present

## 2018-01-04 DIAGNOSIS — M2560 Stiffness of unspecified joint, not elsewhere classified: Secondary | ICD-10-CM | POA: Diagnosis present

## 2018-01-04 DIAGNOSIS — R29898 Other symptoms and signs involving the musculoskeletal system: Secondary | ICD-10-CM | POA: Diagnosis present

## 2018-01-04 DIAGNOSIS — R2689 Other abnormalities of gait and mobility: Secondary | ICD-10-CM | POA: Diagnosis present

## 2018-01-04 DIAGNOSIS — R531 Weakness: Secondary | ICD-10-CM | POA: Insufficient documentation

## 2018-01-04 NOTE — Therapy (Signed)
[facility] [address] [place], [place], [address] Phone: [phone]   Fax:  [phone]  Physical Therapy Treatment  Patient Details  Name: Angela [name] MRN: [id number] Date of Birth: November 09, 1940 Referring Provider: Roe Rutherford NP   Encounter Date: 01/04/2018  PT End of Session - 01/04/18 1303    Visit Number  13    Number of Visits  17    Date for PT Re-Evaluation  01/06/18    Authorization Type  [employer] Medicare    Authorization Time Period  11/11/17 to 01/06/18    Authorization - Visit Number  13    Authorization - Number of Visits  17    PT Start Time  1300    PT Stop Time  1342    PT Time Calculation (min)  42 min    Activity Tolerance  Patient tolerated treatment well;No increased pain    Behavior During Therapy  WFL for tasks assessed/performed       Past Medical History:  Diagnosis Date  . Clostridium difficile infection 2010  . Hip fx (HCC)   . Use of cane as ambulatory aid     Past Surgical History:  Procedure Laterality Date  . INTRAMEDULLARY (IM) NAIL INTERTROCHANTERIC Right 12/21/2012   Procedure: INTRAMEDULLARY (IM) NAIL INTERTROCHANTRIC;  Surgeon: Vickki Hearing, MD;  Location: [facility];  Service: Orthopedics;  Laterality: Right;  . SKIN BIOPSY     3x 2013  . TONSILLECTOMY      There were no vitals filed for this visit.  Subjective Assessment - 01/04/18 1304    Subjective  Pt states she had a good weekend. She doesn't really have any hip pain currently, she will intermittently depending on how she turns.    Patient Stated Goals  To be able to bend over far enough to the right to put on shoes and socks    Currently in Pain?  No/denies    Pain Onset  More than a month ago           [facility] Adult PT Treatment/Exercise - 01/04/18 0001      Knee/Hip Exercises: Aerobic   Nustep  x4 mins L1, seat 5, no UE, for hip flexion AA/ROM      Knee/Hip Exercises: Standing   Forward Step Up  Both;15 reps;Step  Height: 6";Limitations    Forward Step Up Limitations  with opposite knee drive    Functional Squat  2 sets;10 reps cues for proper technique    Wall Squat  10 reps    SLS with Vectors  bil SLS +palov press with GTB x10 reps each    Walking with Sports Cord  sidestepping 77ft x2RT with GTB    Gait Training  fwd tandem gait and sidestepping over 6" and 12" hurdles on balance beam x1RT each    Other Standing Knee Exercises  cone taps on foam x5RT each      Knee/Hip Exercises: Seated   Sit to Sand  10 reps;without UE support           PT Education - 01/04/18 1303    Education provided  Yes    Education Details  exercise technique, will reassess next visit    Person(s) Educated  Patient    Methods  Explanation;Demonstration    Comprehension  Verbalized understanding;Returned demonstration       PT Short Term Goals - 12/08/17 1018      PT SHORT TERM GOAL #1   Title  Patient will demonstrate understanding and report regular compliance with HEP in order to improve patient's functional mobility.     Time  4    Period  Weeks    Status  Achieved      PT SHORT TERM GOAL #2   Title  Patient will demonstrate improved hip extension, abduction, and flexion strength of 1/2 MMT grade bilaterally as evidence of improved hip strength in order to assist with performing functional mobility.     Baseline  12/08/17: Patient has improved by 1/2 MMT grade in all except hip extension bilaterally    Time  4    Period  Weeks    Status  On-going      PT SHORT TERM GOAL #3   Title  Patient will demonstrate ability to maintain single limb stance on the right lower extremity for 5 seconds as evidence of improved balance and improved ability to perform functional activities requiring single limb stance such as stair navigation.     Baseline  12/08/17: Patient maintained SLS for 16 seconds on the right lower extremity.     Time  4    Period  Weeks    Status  Achieved        PT Long Term Goals -  12/08/17 1019      PT LONG TERM GOAL #1   Title  Patient will demonstrate an improvement on FOTO of 10% as evidence of patient's perceived improvement in functional mobility.     Baseline  12/08/17: Patient demonstrated an improvement of 3%.     Time  8    Period  Weeks    Status  On-going      PT LONG TERM GOAL #2   Title  Patient will perform 5x sit to stand in 12 seconds or less and without visible weight shift to the left lower extremity indicating improvement in ability to perform transitions and decrease risk of falling.     Baseline  12/08/17: Patient performed 5x sit to stand in 13.33 seconds this session with weight slightly shifted to the left lower extremity.     Time  8    Period  Weeks    Status  On-going      PT LONG TERM GOAL #3   Title  Patient will demonstrate improvement in MMT of 1 grade for hip extension in bilateral hips as evidence of improved hip extension strength to assist performance of gait and other functional activities.     Baseline  12/08/17: Patient demonstrated improvement of 1/2 MMT grade for hip extension bilaterally.     Time  8    Period  Weeks    Status  On-going      PT LONG TERM GOAL #4   Title  Patient will report no greater than a 2/10 pain in right hip over the course of 1 week period indicating better tolerance of functional activities.     Baseline  12/08/17: Patient reported a 4/10 pain within the course of the last week.     Time  8    Period  Weeks    Status  On-going            Plan - 01/04/18 1342    Clinical Impression Statement  Began session on Nustep for AA/ROM hip flexion. Hip and functional strength along with dynamic balance and strength focus of rest of session. Min-mod cues required for proper technique. Pt continues to be challenged with dynamic balance activities. Pt with 2  LOBs during balance beam and 1 during SLS + palov press but she was able to maintain standing independently. Pt due for reassessment next visit and due to  progress made, pt will likely be discharged to her HEP.    Rehab Potential  Good    Clinical Impairments Affecting Rehab Potential  Positive: Patient's positive attitude, patient's motivation. Negative: Chronicity of current problem     PT Frequency  2x / week    PT Duration  8 weeks    PT Treatment/Interventions  ADLs/Self Care Home Management;Aquatic Therapy;Cryotherapy;Electrical Stimulation;Moist Heat;Ultrasound;Gait training;DME Instruction;Stair training;Functional mobility training;Therapeutic activities;Therapeutic exercise;Balance training;Neuromuscular re-education;Patient/family education;Manual techniques;Passive range of motion    PT Next Visit Plan  reassess and update HEP    PT Home Exercise Plan  11/13/2017: SLS, bridge, abduction and mini-squats; 11/24/17 - RTG for clam shells, standing hip ext and abd no resistance; 11/26/17: March; 3/18: modified thomas test stretch for hip flexors, prone quad stretch with rope    Consulted and Agree with Plan of Care  Patient       Patient will benefit from skilled therapeutic intervention in order to improve the following deficits and impairments:  Abnormal gait, Decreased balance, Decreased mobility, Decreased endurance, Difficulty walking, Decreased range of motion, Improper body mechanics, Decreased activity tolerance, Decreased strength, Pain  Visit Diagnosis: Pain in right hip  Balance problems  Decreased range of motion  Decreased strength  Other symptoms and signs involving the musculoskeletal system  Other abnormalities of gait and mobility     Problem List Patient Active Problem List   Diagnosis Date Noted  . S/P ORIF (open reduction internal fixation) fracture rIght hip IM Nail 12/21/12 12/14/2017  . Effusion of knee joint 10/05/2014  . Acute blood loss anemia 12/22/2012  . Closed hip fracture (HCC) 12/21/2012  . Intertrochanteric fracture of right hip (HCC) 12/21/2012        Jac Canavan PT, DPT  [facility] [address] [place], [place], [address] Phone: [phone]   Fax:  [phone]  Name: PAVNEET MARKWOOD MRN: [id number] Date of Birth: Apr 21, 1941

## 2018-01-06 ENCOUNTER — Ambulatory Visit (HOSPITAL_COMMUNITY): Payer: Medicare Other

## 2018-01-06 DIAGNOSIS — M25551 Pain in right hip: Secondary | ICD-10-CM

## 2018-01-06 DIAGNOSIS — M2560 Stiffness of unspecified joint, not elsewhere classified: Secondary | ICD-10-CM

## 2018-01-06 DIAGNOSIS — R2689 Other abnormalities of gait and mobility: Secondary | ICD-10-CM

## 2018-01-06 DIAGNOSIS — R531 Weakness: Secondary | ICD-10-CM

## 2018-01-06 DIAGNOSIS — R29898 Other symptoms and signs involving the musculoskeletal system: Secondary | ICD-10-CM

## 2018-01-06 NOTE — Patient Instructions (Signed)
Access Code: [email or web address]  URL: [email or web address]  Date: 01/06/2018  Prepared by: Jac CanavanBrooke Powell   Exercises  Sidelying Hip Abduction - 10 reps - 3 sets - 1x daily - 7x weekly  Side Stepping with Resistance at Feet - 10 reps - 3 sets - 1x daily - 7x weekly  Standing Hip Abduction with Theraband Resistance - 10 reps - 3 sets - 1x daily - 7x weekly  Sit to Stand - 10 reps - 3 sets - 1x daily - 7x weekly  Single Leg Stance - 10 reps - 3 sets - 1x daily - 7x weekly  Standing 3-Way Kick - 10 reps - 3 sets - 1x daily - 7x weekly

## 2018-01-06 NOTE — Therapy (Signed)
[facility] [place], [place], [address] Phone: [phone]   Fax:  [phone]  Physical Therapy Treatment/Discharge Summary  Patient Details  Name: Angela [name] MRN: [id number] Date of Birth: [birth year] Referring Provider: Pablo Lawrence NP   Encounter Date: 01/06/2018  PT End of Session - 01/06/18 0949    Visit Number  14    Number of Visits  17    Date for PT Re-Evaluation  01/06/18    Authorization Type  UHC Medicare    Authorization Time Period  11/11/17 to 01/06/18    Authorization - Visit Number  14    Authorization - Number of Visits  17    PT Start Time  5188    PT Stop Time  1015    PT Time Calculation (min)  28 min    Activity Tolerance  Patient tolerated treatment well;No increased pain    Behavior During Therapy  WFL for tasks assessed/performed       Past Medical History:  Diagnosis Date  . Clostridium difficile infection 2010  . Hip fx ([place])   . Use of cane as ambulatory aid     Past Surgical History:  Procedure Laterality Date  . INTRAMEDULLARY (IM) NAIL INTERTROCHANTERIC Right 12/21/2012   Procedure: INTRAMEDULLARY (IM) NAIL INTERTROCHANTRIC;  Surgeon: Carole Civil, MD;  Location: [facility];  Service: Orthopedics;  Laterality: Right;  . SKIN BIOPSY     3x 2013  . TONSILLECTOMY      There were no vitals filed for this visit.  Subjective Assessment - 01/06/18 0950    Subjective  Pt reports that she's doing well today. Her hip is feeling pretty good.    Patient Stated Goals  To be able to bend over far enough to the right to put on shoes and socks    Currently in Pain?  No/denies    Pain Onset  More than a month ago         [facility] PT Assessment - 01/06/18 0001      Assessment   Medical Diagnosis  Right hip pain    Referring Provider  Pablo Lawrence NP    Next MD Visit  January 2020    Prior Therapy  3 weeks in the [facility]      Observation/Other Assessments   Focus on  Therapeutic Outcomes (FOTO)   49% limitation      Strength   Right Hip Flexion  5/5    Right Hip Extension  4/5 was 3    Right Hip ABduction  4+/5 was 4+    Left Hip Flexion  5/5    Left Hip Extension  4/5 was 3+    Left Hip ABduction  4+/5 was 4+      Standardized Balance Assessment   Standardized Balance Assessment  Five Times Sit to Stand    Five times sit to stand comments   6.5 sec, chair, no UE           PT Short Term Goals - 01/06/18 0950      PT SHORT TERM GOAL #1   Title  Patient will demonstrate understanding and report regular compliance with HEP in order to improve patient's functional mobility.     Time  4    Period  Weeks    Status  Achieved      PT SHORT TERM GOAL #2   Title  Patient will demonstrate improved hip extension, abduction, and flexion strength of  1/2 MMT grade bilaterally as evidence of improved hip strength in order to assist with performing functional mobility.     Baseline  4/3: see MMT    Time  4    Period  Weeks    Status  Partially Met      PT SHORT TERM GOAL #3   Title  Patient will demonstrate ability to maintain single limb stance on the right lower extremity for 5 seconds as evidence of improved balance and improved ability to perform functional activities requiring single limb stance such as stair navigation.     Baseline  12/08/17: Patient maintained SLS for 16 seconds on the right lower extremity.     Time  4    Period  Weeks    Status  Achieved        PT Long Term Goals - 01/06/18 6389      PT LONG TERM GOAL #1   Title  Patient will demonstrate an improvement on FOTO of 10% as evidence of patient's perceived improvement in functional mobility.     Baseline  4/3: 49% limitation (was 49% limitation on eval)    Time  8    Period  Weeks    Status  On-going      PT LONG TERM GOAL #2   Title  Patient will perform 5x sit to stand in 12 seconds or less and without visible weight shift to the left lower extremity indicating  improvement in ability to perform transitions and decrease risk of falling.     Baseline  4/3: 6.5sec, chair, no UE, proper mechanics    Time  8    Period  Weeks    Status  Achieved      PT LONG TERM GOAL #3   Title  Patient will demonstrate improvement in MMT of 1 grade for hip extension in bilateral hips as evidence of improved hip extension strength to assist performance of gait and other functional activities.     Baseline  4/3: see MMT    Time  8    Period  Weeks    Status  Partially Met      PT LONG TERM GOAL #4   Title  Patient will report no greater than a 2/10 pain in right hip over the course of 1 week period indicating better tolerance of functional activities.     Baseline  4/3: doesn't really pay attention to her hip pain but states that it has been 2/10 for at least 1 week    Time  8    Period  Weeks    Status  Achieved            Plan - 01/06/18 1018    Clinical Impression Statement  PT reassessed pt's goals and outcome measures this date. Pt has made tremendous improvements as illustrated above. Her MMT, functional strength, and balance are all WFL-WNL and her overall hip pain has significantly improved. She reports that she is able to walk over 1 mile everyday with her dog without issue. At this time, pt is ready for discharge due to progress made. Strengthening HEP updated.     Rehab Potential  Good    Clinical Impairments Affecting Rehab Potential  Positive: Patient's positive attitude, patient's motivation. Negative: Chronicity of current problem     PT Frequency  2x / week    PT Duration  8 weeks    PT Treatment/Interventions  ADLs/Self Care Home Management;Aquatic Therapy;Cryotherapy;Electrical Stimulation;Moist Heat;Ultrasound;Gait training;DME Instruction;Stair training;Functional mobility  training;Therapeutic activities;Therapeutic exercise;Balance training;Neuromuscular re-education;Patient/family education;Manual techniques;Passive range of motion    PT  Next Visit Plan  discharged    PT Home Exercise Plan  11/13/2017: SLS, bridge, abduction and mini-squats; 11/24/17 - RTG for clam shells, standing hip ext and abd no resistance; 11/26/17: March; 3/18: modified thomas test stretch for hip flexors, prone quad stretch with rope; 4/3: see below for detailed additions    Consulted and Agree with Plan of Care  Patient       Patient will benefit from skilled therapeutic intervention in order to improve the following deficits and impairments:  Abnormal gait, Decreased balance, Decreased mobility, Decreased endurance, Difficulty walking, Decreased range of motion, Improper body mechanics, Decreased activity tolerance, Decreased strength, Pain  Visit Diagnosis: Pain in right hip  Balance problems  Decreased range of motion  Decreased strength  Other symptoms and signs involving the musculoskeletal system  Other abnormalities of gait and mobility     Problem List Patient Active Problem List   Diagnosis Date Noted  . S/P ORIF (open reduction internal fixation) fracture rIght hip IM Nail 12/21/12 12/14/2017  . Effusion of knee joint 10/05/2014  . Acute blood loss anemia 12/22/2012  . Closed hip fracture ([place]) 12/21/2012  . Intertrochanteric fracture of right hip ([place]) 12/21/2012      PHYSICAL THERAPY DISCHARGE SUMMARY  Visits from Start of Care: 14  Current functional level related to goals / functional outcomes: See above   Remaining deficits: See above   Education / Equipment: HEP Plan: Patient agrees to discharge.  Patient goals were partially met. Patient is being discharged due to meeting the stated rehab goals.  ?????      Geraldine Solar PT, [place] [address] [place], [place], [address] Phone: [phone]   Fax:  [phone]  Name: Angela [name] MRN: [id number] Date of Birth: [birth year]

## 2018-01-26 ENCOUNTER — Other Ambulatory Visit (HOSPITAL_COMMUNITY): Payer: Self-pay | Admitting: Internal Medicine

## 2018-01-26 DIAGNOSIS — Z780 Asymptomatic menopausal state: Secondary | ICD-10-CM

## 2018-02-01 ENCOUNTER — Ambulatory Visit (HOSPITAL_COMMUNITY)
Admission: RE | Admit: 2018-02-01 | Discharge: 2018-02-01 | Disposition: A | Discharge status: Home / Self Care | Payer: Medicare Other | Source: Ambulatory Visit | Attending: Internal Medicine | Admitting: Internal Medicine

## 2018-02-01 ENCOUNTER — Encounter (HOSPITAL_COMMUNITY): Payer: Self-pay

## 2018-02-01 DIAGNOSIS — M8589 Other specified disorders of bone density and structure, multiple sites: Secondary | ICD-10-CM | POA: Insufficient documentation

## 2018-02-01 DIAGNOSIS — Z1231 Encounter for screening mammogram for malignant neoplasm of breast: Secondary | ICD-10-CM | POA: Diagnosis present

## 2018-02-01 DIAGNOSIS — Z780 Asymptomatic menopausal state: Secondary | ICD-10-CM

## 2018-04-26 ENCOUNTER — Ambulatory Visit: Payer: Medicare Other | Admitting: Orthopedic Surgery

## 2018-04-26 VITALS — BP 140/54 | HR 71 | Ht 61.0 in | Wt 121.0 lb

## 2018-04-26 DIAGNOSIS — Z967 Presence of other bone and tendon implants: Secondary | ICD-10-CM

## 2018-04-26 DIAGNOSIS — M545 Low back pain: Secondary | ICD-10-CM

## 2018-04-26 DIAGNOSIS — G8929 Other chronic pain: Secondary | ICD-10-CM | POA: Diagnosis not present

## 2018-04-26 DIAGNOSIS — Z8781 Personal history of (healed) traumatic fracture: Secondary | ICD-10-CM

## 2018-04-26 DIAGNOSIS — Z9889 Other specified postprocedural states: Secondary | ICD-10-CM

## 2018-04-26 DIAGNOSIS — M7061 Trochanteric bursitis, right hip: Secondary | ICD-10-CM

## 2018-04-26 NOTE — Progress Notes (Signed)
Chief Complaint  Patient presents with  . Hip Pain    Recheck on right hip, DOS 12-21-12.     76 5 years ago had a right gamma nail placed for intertrochanteric hip fracture.  She is in today for follow-up visit regarding continued right hip pain  She complains of mild groin pain and hip flexion stiffness with some difficulty getting dressed  However, the primary complaint is lateral hip and leg pain radiating from the greater trochanter into the right knee  She also has some mild tenderness in her lower back    Review of Systems  Neurological: Negative for tingling, sensory change, focal weakness and weakness.   Physical Exam  Constitutional: She is oriented to person, place, and time. She appears well-developed and well-nourished.  Musculoskeletal:  Left hip flexion 120 degrees right hip flexion 105 degrees  Stinchfield test (hip flexion against resistance does not reproduce the pain)  Point tenderness over the right greater trochanter palpable tenderness along the iliotibial band and lateral thigh to the knee but not below  Mild tenderness right SI joint and right gluteal area  Neurological: She is alert and oriented to person, place, and time.  Psychiatric: She has a normal mood and affect. Judgment normal.  Vitals reviewed.   Encounter Diagnoses  Name Primary?  . S/P ORIF (open reduction internal fixation) fracture rIght hip IM Nail 12/21/12 Yes  . Trochanteric bursitis of right hip   . Chronic right-sided low back pain without sciatica    Procedure note injection for   right hip bursitis  Verbal consent was obtained for injection of the right hip  Timeout was completed to confirm the injection site  The medications used were 40 mg of Depo-Medrol and 1% lidocaine 3 cc  Anesthesia was provided by ethyl chloride and the skin was prepped with alcohol.  After cleaning the skin with alcohol a 25-gauge needle was used to inject the right bursa of the hip  Fracture  right hip healed without any consequence other than the fact that she is got some right hip bursitis  This pain radiates down her right leg and may be associated with some lower back pain.  Recommend therapeutic and diagnostic injection if she does not improve then we may need to consider a diagnostic hip joint injection.  Also work-up lower back if needed

## 2018-05-24 ENCOUNTER — Ambulatory Visit: Payer: Medicare Other | Admitting: Orthopedic Surgery

## 2018-05-24 ENCOUNTER — Encounter: Payer: Self-pay | Admitting: Orthopedic Surgery

## 2018-05-24 VITALS — BP 131/82 | HR 78 | Ht 59.0 in | Wt 134.0 lb

## 2018-05-24 DIAGNOSIS — M7061 Trochanteric bursitis, right hip: Secondary | ICD-10-CM | POA: Diagnosis not present

## 2018-05-24 NOTE — Progress Notes (Signed)
77 years old had a right gamma nail for intertrochanteric hip fracture 5 years ago complains of mild groin pain stiff hip with flexion and some difficulty getting dressed with a primary symptom however over the lateral hip and leg pain radiating from the greater trochanter to the right knee.  We did find some mild tenderness in her back  We x-rayed her hip and the x-ray showed the hip healed without consequence there is no hardware complication  We diagnosed with bursitis and injected the right hip  She comes in for routine follow-up and says that the injection: Helped although it started to wear off  Pain right greater trochanter, after the injection she had increased range of motion and improved ability to walk  Review of systems mild groin pain seems to be in significant  BP 131/82   Pulse 78   Ht 4\' 11"  (1.499 m)   Wt 134 lb (60.8 kg)   BMI 27.06 kg/m   Awake alert and oriented female walks without supportive devices no significant limp seen  Mood affect normal  General nutrition status normal  Tenderness over the right greater trochanter.  Hip range of motion within normal limits no leg length discrepancies hip strength and flexion is normal neurovascular exam is normal  Inject right greater trochanteric bursa  Encounter Diagnosis  Name Primary?  . Trochanteric bursitis of right hip Yes    Follow-up as needed but not sooner than 30 days  Procedure note injection for   right hip bursitis  Verbal consent was obtained for injection of the  rt hip  Timeout was completed to confirm the injection site  The medications used were 40 mg of Depo-Medrol and 1% lidocaine 3 cc  Anesthesia was provided by ethyl chloride and the skin was prepped with alcohol.  After cleaning the skin with alcohol a 25-gauge needle was used to inject the  rt bursa of the hip

## 2018-07-07 ENCOUNTER — Ambulatory Visit: Payer: Medicare Other | Admitting: Orthopedic Surgery

## 2018-07-14 ENCOUNTER — Ambulatory Visit: Payer: Medicare Other | Admitting: Orthopedic Surgery

## 2018-07-14 VITALS — BP 126/80 | HR 72 | Ht 59.0 in | Wt 133.0 lb

## 2018-07-14 DIAGNOSIS — M7061 Trochanteric bursitis, right hip: Secondary | ICD-10-CM | POA: Diagnosis not present

## 2018-07-14 NOTE — Progress Notes (Signed)
Chief Complaint  Patient presents with  . Follow-up    Recheck on right hip    77 year old female status post internal fixation gamma nail right hip 2014 recently developed greater trochanteric bursitis gets good relief with injection  Tenderness over the right greater trochanter  Procedure note injection for   RT hip bursitis  Verbal consent was obtained for injection of the  RT hip  Timeout was completed to confirm the injection site  The medications used were 40 mg of Depo-Medrol and 1% lidocaine 3 cc  Anesthesia was provided by ethyl chloride and the skin was prepped with alcohol.  After cleaning the skin with alcohol a 25-gauge needle was used to inject the  RT bursa of the hip  Encounter Diagnosis  Name Primary?  . Trochanteric bursitis of right hip Yes

## 2018-08-18 ENCOUNTER — Encounter: Payer: Self-pay | Admitting: Orthopedic Surgery

## 2018-08-18 ENCOUNTER — Ambulatory Visit: Payer: Medicare Other | Admitting: Orthopedic Surgery

## 2018-08-18 VITALS — BP 131/77 | HR 80 | Ht 59.0 in | Wt 133.0 lb

## 2018-08-18 DIAGNOSIS — Z9889 Other specified postprocedural states: Secondary | ICD-10-CM

## 2018-08-18 DIAGNOSIS — Z8781 Personal history of (healed) traumatic fracture: Secondary | ICD-10-CM | POA: Diagnosis not present

## 2018-08-18 NOTE — Progress Notes (Signed)
Chief Complaint  Patient presents with  . Injections    right hip bursa    Pain right hip patient receiving injections every other month for bursitis right hip complains of pain same place injections worked well last time injection will be repeated  Procedure note injection for   RIHGT  hip bursitis  Verbal consent was obtained for injection of the  RT  hip  Timeout was completed to confirm the injection site  The medications used were 40 mg of Depo-Medrol and 1% lidocaine 3 cc  Anesthesia was provided by ethyl chloride and the skin was prepped with alcohol.  After cleaning the skin with alcohol a 25-gauge needle was used to inject the  RT  bursa of the hip  FU PRN

## 2018-08-18 NOTE — Progress Notes (Signed)
.  h 

## 2018-09-20 ENCOUNTER — Encounter: Payer: Self-pay | Admitting: Orthopedic Surgery

## 2018-09-20 ENCOUNTER — Ambulatory Visit: Payer: Medicare Other | Admitting: Orthopedic Surgery

## 2018-09-20 VITALS — BP 121/80 | HR 78 | Ht 59.0 in | Wt 133.0 lb

## 2018-09-20 DIAGNOSIS — Z8781 Personal history of (healed) traumatic fracture: Secondary | ICD-10-CM

## 2018-09-20 DIAGNOSIS — M7061 Trochanteric bursitis, right hip: Secondary | ICD-10-CM

## 2018-09-20 DIAGNOSIS — Z9889 Other specified postprocedural states: Secondary | ICD-10-CM

## 2018-09-20 NOTE — Progress Notes (Signed)
Chief Complaint  Patient presents with  . Hip Pain    right wants injection    Encounter Diagnoses  Name Primary?  . S/P ORIF (open reduction internal fixation) fracture rIght hip IM Nail 12/21/12   . Trochanteric bursitis of right hip Yes    Patient requests right hip injection  Procedure note injection for   right hip bursitis  Verbal consent was obtained for injection of the  rt hip  Timeout was completed to confirm the injection site  The medications used were 40 mg of Depo-Medrol and 1% lidocaine 3 cc  Anesthesia was provided by ethyl chloride and the skin was prepped with alcohol.  After cleaning the skin with alcohol a 25-gauge needle was used to inject the  rt bursa of the hip  She would like to be seen in 5 to 6 weeks which is fine

## 2018-11-03 ENCOUNTER — Ambulatory Visit: Payer: Medicare Other | Admitting: Orthopedic Surgery

## 2018-11-03 ENCOUNTER — Encounter: Payer: Self-pay | Admitting: Orthopedic Surgery

## 2018-11-03 VITALS — BP 129/86 | HR 75 | Ht 59.0 in | Wt 132.0 lb

## 2018-11-03 DIAGNOSIS — M7061 Trochanteric bursitis, right hip: Secondary | ICD-10-CM | POA: Diagnosis not present

## 2018-11-03 DIAGNOSIS — Z9889 Other specified postprocedural states: Secondary | ICD-10-CM

## 2018-11-03 DIAGNOSIS — Z8781 Personal history of (healed) traumatic fracture: Secondary | ICD-10-CM

## 2018-11-03 NOTE — Progress Notes (Signed)
Chief Complaint  Patient presents with  . Hip Pain    right     Encounter Diagnoses  Name Primary?  . S/P ORIF (open reduction internal fixation) fracture rIght hip IM Nail 12/21/12   . Trochanteric bursitis of right hip Yes    Requests right hip injection   Procedure note injection for   Right  hip bursitis  Verbal consent was obtained for injection of the  Right  hip  Timeout was completed to confirm the injection site  The medications used were 40 mg of Depo-Medrol and 1% lidocaine 3 cc  Anesthesia was provided by ethyl chloride and the skin was prepped with alcohol.  After cleaning the skin with alcohol a 25-gauge needle was used to inject the  Right  bursa of the hip

## 2018-12-08 ENCOUNTER — Encounter: Payer: Self-pay | Admitting: Orthopedic Surgery

## 2018-12-08 ENCOUNTER — Ambulatory Visit: Payer: Medicare Other | Admitting: Orthopedic Surgery

## 2018-12-08 VITALS — BP 118/72 | HR 80 | Ht 59.0 in | Wt 132.0 lb

## 2018-12-08 DIAGNOSIS — M7061 Trochanteric bursitis, right hip: Secondary | ICD-10-CM

## 2018-12-08 DIAGNOSIS — Z9889 Other specified postprocedural states: Secondary | ICD-10-CM

## 2018-12-08 DIAGNOSIS — Z8781 Personal history of (healed) traumatic fracture: Secondary | ICD-10-CM

## 2018-12-08 NOTE — Progress Notes (Signed)
Encounter Diagnosis  Name Primary?  . S/P ORIF (open reduction internal fixation) fracture rIght hip IM Nail 12/21/12 Yes   Chief Complaint  Patient presents with  . Hip Pain    right trochanter wants injection    Procedure note injection for   Right  hip bursitis  Verbal consent was obtained for injection of the  Right  hip  Timeout was completed to confirm the injection site  The medications used were 40 mg of Depo-Medrol and 1% lidocaine 3 cc  Anesthesia was provided by ethyl chloride and the skin was prepped with alcohol.  After cleaning the skin with alcohol a 25-gauge needle was used to inject the  Right  bursa of the hip  Fu 1 months

## 2018-12-31 ENCOUNTER — Other Ambulatory Visit (HOSPITAL_COMMUNITY): Payer: Self-pay | Admitting: Internal Medicine

## 2018-12-31 DIAGNOSIS — Z1231 Encounter for screening mammogram for malignant neoplasm of breast: Secondary | ICD-10-CM

## 2019-01-05 ENCOUNTER — Encounter: Payer: Self-pay | Admitting: Orthopedic Surgery

## 2019-01-05 ENCOUNTER — Ambulatory Visit: Payer: Medicare Other | Admitting: Orthopedic Surgery

## 2019-01-05 ENCOUNTER — Other Ambulatory Visit: Payer: Self-pay

## 2019-01-05 VITALS — BP 107/73 | HR 70 | Temp 97.8°F | Ht 59.0 in | Wt 132.0 lb

## 2019-01-05 DIAGNOSIS — Z8781 Personal history of (healed) traumatic fracture: Secondary | ICD-10-CM

## 2019-01-05 DIAGNOSIS — Z9889 Other specified postprocedural states: Secondary | ICD-10-CM

## 2019-01-05 DIAGNOSIS — M7061 Trochanteric bursitis, right hip: Secondary | ICD-10-CM

## 2019-01-05 NOTE — Progress Notes (Signed)
Chief Complaint  Patient presents with  . Hip Pain    right/ wants injection     Procedure note injection for   Right  hip bursitis  Verbal consent was obtained for injection of the  Right  hip  Timeout was completed to confirm the injection site  The medications used were 40 mg of Depo-Medrol and 1% lidocaine 3 cc  Anesthesia was provided by ethyl chloride and the skin was prepped with alcohol.  After cleaning the skin with alcohol a 25-gauge needle was used to inject the  Right  bursa of the hip  Encounter Diagnoses  Name Primary?  . Trochanteric bursitis of right hip Yes  . S/P ORIF (open reduction internal fixation) fracture rIght hip IM Nail 12/21/12     Fu 6 weeks

## 2019-02-03 ENCOUNTER — Telehealth: Payer: Self-pay | Admitting: Radiology

## 2019-02-03 NOTE — Telephone Encounter (Signed)
Patient called, and asked if she could come in now for an injection in her hip.  It is hurting her badly, please call her.

## 2019-02-04 ENCOUNTER — Ambulatory Visit (HOSPITAL_COMMUNITY): Payer: Medicare Other

## 2019-02-04 ENCOUNTER — Telehealth: Payer: Self-pay | Admitting: Radiology

## 2019-02-04 NOTE — Telephone Encounter (Signed)
Cant do today have surgery at 65  Monday I can do it

## 2019-02-04 NOTE — Telephone Encounter (Signed)
Left message for her to call back and schedule appointment for Monday

## 2019-02-04 NOTE — Telephone Encounter (Signed)
Patient called and LM again, so I called patient and made appointment for Monday morning, 840am, FYI

## 2019-02-07 ENCOUNTER — Other Ambulatory Visit: Payer: Self-pay

## 2019-02-07 ENCOUNTER — Encounter: Payer: Self-pay | Admitting: Orthopedic Surgery

## 2019-02-07 ENCOUNTER — Ambulatory Visit (INDEPENDENT_AMBULATORY_CARE_PROVIDER_SITE_OTHER): Payer: Medicare Other | Admitting: Orthopedic Surgery

## 2019-02-07 VITALS — Temp 97.4°F | Resp 16 | Ht 59.0 in | Wt 132.0 lb

## 2019-02-07 DIAGNOSIS — M7061 Trochanteric bursitis, right hip: Secondary | ICD-10-CM

## 2019-02-07 NOTE — Progress Notes (Signed)
Chief Complaint  Patient presents with  . Hip Pain    right wants injection    Encounter Diagnosis  Name Primary?  . Trochanteric bursitis of right hip Yes    Procedure note injection for   RIGHT  hip bursitis  Verbal consent was obtained for injection of the  RIGHT  hip  Timeout was completed to confirm the injection site  The medications used were 40 mg of Depo-Medrol and 1% lidocaine 3 cc  Anesthesia was provided by ethyl chloride and the skin was prepped with alcohol.  After cleaning the skin with alcohol a 25-gauge needle was used to inject the  RIGHT  bursa of the hip  FU 4 WEEKS

## 2019-02-07 NOTE — Patient Instructions (Addendum)
APPLY ICE AND TOPICAL MUSCLE CREAMS AS NEEDED   These are the muscle and arthrits creams I recommend:  PLEASE READ THE PACKAGE INSTRUCTIONS BEFORE USING   Ben Gay arthritis cream  Icy hot vanishing gel  Aspercreme odor free  Myoflex Oderless pain reliever  Capzasin  Sportscreme  Max freeze Trochanteric Bursitis Rehab Ask your health care provider which exercises are safe for you. Do exercises exactly as told by your health care provider and adjust them as directed. It is normal to feel mild stretching, pulling, tightness, or discomfort as you do these exercises, but you should stop right away if you feel sudden pain or your pain gets worse.Do not begin these exercises until told by your health care provider. Stretching exercises These exercises warm up your muscles and joints and improve the movement and flexibility of your hip. These exercises also help to relieve pain and stiffness. Exercise A: Iliotibial band stretch  1. Lie on your side with your left / right leg in the top position. 2. Bend your left / right knee and grab your ankle. 3. Slowly bring your knee back so your thigh is behind your body. 4. Slowly lower your knee toward the floor until you feel a gentle stretch on the outside of your left / right thigh. If you do not feel a stretch and your knee will not fall farther, place the heel of your other foot on top of your outer knee and pull your thigh down farther. 5. Hold this position for __________ seconds. 6. Slowly return to the starting position. Repeat __________ times. Complete this exercise __________ times a day. Strengthening exercises These exercises build strength and endurance in your hip and pelvis. Endurance is the ability to use your muscles for a long time, even after they get tired. Exercise B: Bridge (hip extensors)  1. Lie on your back on a firm surface with your knees bent and your feet flat on the floor. 2. Tighten your buttocks muscles and lift  your buttocks off the floor until your trunk is level with your thighs. You should feel the muscles working in your buttocks and the back of your thighs. If this exercise is too easy, try doing it with your arms crossed over your chest. 3. Hold this position for __________ seconds. 4. Slowly return to the starting position. 5. Let your muscles relax completely between repetitions. Repeat __________ times. Complete this exercise __________ times a day. Exercise C: Squats (knee extensors and  quadriceps) 1. Stand in front of a table, with your feet and knees pointing straight ahead. You may rest your hands on the table for balance but not for support. 2. Slowly bend your knees and lower your hips like you are going to sit in a chair. ? Keep your weight over your heels, not over your toes. ? Keep your lower legs upright so they are parallel with the table legs. ? Do not let your hips go lower than your knees. ? Do not bend lower than told by your health care provider. ? If your hip pain increases, do not bend as low. 3. Hold this position for __________ seconds. 4. Slowly push with your legs to return to standing. Do not use your hands to pull yourself to standing. Repeat __________ times. Complete this exercise __________ times a day. Exercise D: Hip hike 1. Stand sideways on a bottom step. Stand on your left / right leg with your other foot unsupported next to the step. You can hold  onto the railing or wall if needed for balance. 2. Keeping your knees straight and your torso square, lift your left / right hip up toward the ceiling. 3. Hold this position for __________ seconds. 4. Slowly let your left / right hip lower toward the floor, past the starting position. Your foot should get closer to the floor. Do not lean or bend your knees. Repeat __________ times. Complete this exercise __________ times a day. Exercise E: Single leg stand 1. Stand near a counter or door frame that you can hold onto  for balance as needed. It is helpful to stand in front of a mirror for this exercise so you can watch your hip. 2. Squeeze your left / right buttock muscles then lift up your other foot. Do not let your left / right hip push out to the side. 3. Hold this position for __________ seconds. Repeat __________ times. Complete this exercise __________ times a day. This information is not intended to replace advice given to you by your health care provider. Make sure you discuss any questions you have with your health care provider. Document Released: 10/30/2004 Document Revised: 05/29/2016 Document Reviewed: 09/07/2015 Elsevier Interactive Patient Education  2019 [employer]

## 2019-02-16 ENCOUNTER — Ambulatory Visit: Payer: Self-pay | Admitting: Orthopedic Surgery

## 2019-03-07 ENCOUNTER — Other Ambulatory Visit: Payer: Self-pay

## 2019-03-07 ENCOUNTER — Ambulatory Visit: Payer: Medicare Other | Admitting: Orthopedic Surgery

## 2019-03-07 ENCOUNTER — Encounter: Payer: Self-pay | Admitting: Orthopedic Surgery

## 2019-03-07 VITALS — BP 125/71 | HR 67 | Temp 98.2°F | Ht 59.0 in | Wt 130.0 lb

## 2019-03-07 DIAGNOSIS — M7061 Trochanteric bursitis, right hip: Secondary | ICD-10-CM

## 2019-03-07 NOTE — Patient Instructions (Signed)
Follow up her choice

## 2019-03-07 NOTE — Progress Notes (Signed)
Chief Complaint  Patient presents with  . Hip Pain    right wants injection    Requested injection  Procedure note injection for right hip bursitis  Verbal consent was obtained for injection of the right hip  Timeout was completed to confirm the injection site  The medications used were 40 mg of Depo-Medrol and 1% lidocaine 3 cc  Anesthesia was provided by ethyl chloride and the skin was prepped with alcohol.  After cleaning the skin with alcohol a 25-gauge needle was used to inject the greater trochanteric bursa right hip   No complications were noted  Encounter Diagnosis  Name Primary?  . Trochanteric bursitis of right hip Yes    Fu patient choice

## 2019-03-10 ENCOUNTER — Other Ambulatory Visit: Payer: Self-pay

## 2019-03-10 ENCOUNTER — Ambulatory Visit (HOSPITAL_COMMUNITY)
Admission: RE | Admit: 2019-03-10 | Discharge: 2019-03-10 | Disposition: A | Discharge status: Home / Self Care | Payer: Medicare Other | Source: Ambulatory Visit | Attending: Internal Medicine | Admitting: Internal Medicine

## 2019-03-10 DIAGNOSIS — Z1231 Encounter for screening mammogram for malignant neoplasm of breast: Secondary | ICD-10-CM | POA: Diagnosis present

## 2019-04-11 ENCOUNTER — Ambulatory Visit (INDEPENDENT_AMBULATORY_CARE_PROVIDER_SITE_OTHER): Payer: Medicare Other | Admitting: Orthopedic Surgery

## 2019-04-11 ENCOUNTER — Other Ambulatory Visit: Payer: Self-pay

## 2019-04-11 ENCOUNTER — Encounter: Payer: Self-pay | Admitting: Orthopedic Surgery

## 2019-04-11 VITALS — BP 128/80 | HR 72 | Temp 98.4°F | Ht 59.0 in | Wt 130.0 lb

## 2019-04-11 DIAGNOSIS — M7061 Trochanteric bursitis, right hip: Secondary | ICD-10-CM

## 2019-04-11 DIAGNOSIS — Z8781 Personal history of (healed) traumatic fracture: Secondary | ICD-10-CM

## 2019-04-11 DIAGNOSIS — Z9889 Other specified postprocedural states: Secondary | ICD-10-CM

## 2019-04-11 DIAGNOSIS — G8929 Other chronic pain: Secondary | ICD-10-CM

## 2019-04-11 DIAGNOSIS — M545 Low back pain: Secondary | ICD-10-CM

## 2019-04-11 NOTE — Progress Notes (Signed)
Chief Complaint  Patient presents with  . Hip Pain    right hip pain wants injection    Requesting injection   Procedure note injection for right hip bursitis  Verbal consent was obtained for injection of the right hip  Timeout was completed to confirm the injection site  The medications used were 40 mg of Depo-Medrol and 1% lidocaine 3 cc  Anesthesia was provided by ethyl chloride and the skin was prepped with alcohol.  After cleaning the skin with alcohol a 25-gauge needle was used to inject the greater trochanteric bursa right hip   No complications were noted  Encounter Diagnoses  Name Primary?  . S/P ORIF (open reduction internal fixation) fracture rIght hip IM Nail 12/21/12   . Chronic right-sided low back pain without sciatica   . Trochanteric bursitis, right hip Yes    Fu 1 month

## 2019-05-09 ENCOUNTER — Other Ambulatory Visit: Payer: Self-pay

## 2019-05-09 ENCOUNTER — Encounter: Payer: Self-pay | Admitting: Orthopedic Surgery

## 2019-05-09 ENCOUNTER — Ambulatory Visit (INDEPENDENT_AMBULATORY_CARE_PROVIDER_SITE_OTHER): Payer: Medicare Other | Admitting: Orthopedic Surgery

## 2019-05-09 VITALS — BP 129/82 | HR 98 | Temp 98.4°F | Ht 59.0 in | Wt 130.0 lb

## 2019-05-09 DIAGNOSIS — M25551 Pain in right hip: Secondary | ICD-10-CM | POA: Diagnosis not present

## 2019-05-09 NOTE — Progress Notes (Signed)
Chief Complaint  Patient presents with  . Hip Pain    right     Requested injection   Procedure note injection for right hip bursitis  Verbal consent was obtained for injection of the right hip  Timeout was completed to confirm the injection site  The medications used were 40 mg of Depo-Medrol and 1% lidocaine 3 cc  Anesthesia was provided by ethyl chloride and the skin was prepped with alcohol.  After cleaning the skin with alcohol a 25-gauge needle was used to inject the greater trochanteric bursa right hip   No complications were noted  Encounter Diagnosis  Name Primary?  . Pain in right hip Yes    Fu 4-6 weeks

## 2019-05-24 ENCOUNTER — Other Ambulatory Visit: Payer: Self-pay

## 2019-05-24 DIAGNOSIS — Z20822 Contact with and (suspected) exposure to covid-19: Secondary | ICD-10-CM

## 2019-05-25 LAB — NOVEL CORONAVIRUS, NAA: SARS-CoV-2, NAA: NOT DETECTED

## 2019-06-08 ENCOUNTER — Ambulatory Visit: Payer: Medicare Other | Admitting: Orthopedic Surgery

## 2019-06-08 ENCOUNTER — Encounter: Payer: Self-pay | Admitting: Orthopedic Surgery

## 2019-06-10 ENCOUNTER — Encounter: Payer: Self-pay | Admitting: Orthopedic Surgery

## 2019-06-10 ENCOUNTER — Other Ambulatory Visit: Payer: Self-pay

## 2019-06-10 ENCOUNTER — Ambulatory Visit (INDEPENDENT_AMBULATORY_CARE_PROVIDER_SITE_OTHER): Payer: Medicare Other | Admitting: Orthopedic Surgery

## 2019-06-10 VITALS — BP 119/70 | HR 75 | Temp 97.8°F | Ht 59.0 in | Wt 120.2 lb

## 2019-06-10 DIAGNOSIS — M545 Low back pain, unspecified: Secondary | ICD-10-CM

## 2019-06-10 DIAGNOSIS — M25551 Pain in right hip: Secondary | ICD-10-CM

## 2019-06-10 DIAGNOSIS — G8929 Other chronic pain: Secondary | ICD-10-CM

## 2019-06-10 DIAGNOSIS — M7061 Trochanteric bursitis, right hip: Secondary | ICD-10-CM

## 2019-06-10 DIAGNOSIS — Z9889 Other specified postprocedural states: Secondary | ICD-10-CM

## 2019-06-10 DIAGNOSIS — Z8781 Personal history of (healed) traumatic fracture: Secondary | ICD-10-CM

## 2019-06-10 NOTE — Progress Notes (Signed)
Chief Complaint  Patient presents with  . Hip Pain    R/no pain for the last 2 days and this morning its back    History of right hip pain near the gr troch with h/o orif right hip with hip screw and nail   Procedure note injection for right hip bursitis  Verbal consent was obtained for injection of the right hip  Timeout was completed to confirm the injection site  The medications used were 40 mg of Depo-Medrol and 1% lidocaine 3 cc  Anesthesia was provided by ethyl chloride and the skin was prepped with alcohol.  After cleaning the skin with alcohol a 25-gauge needle was used to inject the greater trochanteric bursa right hip   No complications were noted  Encounter Diagnoses  Name Primary?  . Pain in right hip Yes  . S/P ORIF (open reduction internal fixation) fracture rIght hip IM Nail 12/21/12   . Chronic right-sided low back pain without sciatica   . Trochanteric bursitis, right hip

## 2019-07-13 ENCOUNTER — Ambulatory Visit (INDEPENDENT_AMBULATORY_CARE_PROVIDER_SITE_OTHER): Payer: Medicare Other | Admitting: Orthopedic Surgery

## 2019-07-13 ENCOUNTER — Other Ambulatory Visit: Payer: Self-pay

## 2019-07-13 VITALS — BP 119/76 | HR 74 | Temp 97.2°F | Ht 59.0 in | Wt 118.0 lb

## 2019-07-13 DIAGNOSIS — Z9889 Other specified postprocedural states: Secondary | ICD-10-CM

## 2019-07-13 DIAGNOSIS — M7061 Trochanteric bursitis, right hip: Secondary | ICD-10-CM

## 2019-07-13 DIAGNOSIS — Z8781 Personal history of (healed) traumatic fracture: Secondary | ICD-10-CM

## 2019-07-13 NOTE — Progress Notes (Signed)
Chief Complaint  Patient presents with  . Follow-up    Recheck on right hip.   HAD A FALL  WALKING FINE   Procedure note injection for right hip bursitis  Verbal consent was obtained for injection of the right hip  Timeout was completed to confirm the injection site  The medications used were 40 mg of Depo-Medrol and 1% lidocaine 3 cc  Anesthesia was provided by ethyl chloride and the skin was prepped with alcohol.  After cleaning the skin with alcohol a 25-gauge needle was used to inject the greater trochanteric bursa right hip   No complications were noted  Encounter Diagnoses  Name Primary?  . S/P ORIF (open reduction internal fixation) fracture rIght hip IM Nail 12/21/12   . Trochanteric bursitis, right hip Yes    FU 4 WEEKS

## 2019-07-13 NOTE — Patient Instructions (Signed)

## 2019-08-10 ENCOUNTER — Other Ambulatory Visit: Payer: Self-pay

## 2019-08-10 ENCOUNTER — Ambulatory Visit (INDEPENDENT_AMBULATORY_CARE_PROVIDER_SITE_OTHER): Payer: Medicare Other | Admitting: Orthopedic Surgery

## 2019-08-10 VITALS — BP 118/62 | HR 83 | Temp 97.0°F | Ht 59.0 in | Wt 120.0 lb

## 2019-08-10 DIAGNOSIS — M7061 Trochanteric bursitis, right hip: Secondary | ICD-10-CM

## 2019-08-10 NOTE — Progress Notes (Signed)
Chief Complaint  Patient presents with  . Follow-up    injection right hip    Angela [name] comes in for monthly injection right hip  Exam pain just at the superior aspect of the greater trochanter and soft tissue  3 injected at the point of maximal tenderness  Procedure note injection for right hip bursitis  Verbal consent was obtained for injection of the right hip  Timeout was completed to confirm the injection site  The medications used were 40 mg of Depo-Medrol and 1% lidocaine 3 cc  Anesthesia was provided by ethyl chloride and the skin was prepped with alcohol.  After cleaning the skin with alcohol a 25-gauge needle was used to inject the greater trochanteric bursa right hip   No complications were noted  Encounter Diagnosis  Name Primary?  . Trochanteric bursitis, right hip Yes

## 2019-08-10 NOTE — Patient Instructions (Signed)

## 2019-09-12 ENCOUNTER — Other Ambulatory Visit: Payer: Self-pay

## 2019-09-12 ENCOUNTER — Ambulatory Visit (INDEPENDENT_AMBULATORY_CARE_PROVIDER_SITE_OTHER): Payer: Medicare Other | Admitting: Orthopedic Surgery

## 2019-09-12 ENCOUNTER — Encounter: Payer: Self-pay | Admitting: Orthopedic Surgery

## 2019-09-12 VITALS — BP 148/73 | HR 81 | Ht 59.0 in | Wt 118.0 lb

## 2019-09-12 DIAGNOSIS — M7061 Trochanteric bursitis, right hip: Secondary | ICD-10-CM | POA: Diagnosis not present

## 2019-09-12 DIAGNOSIS — Z9889 Other specified postprocedural states: Secondary | ICD-10-CM

## 2019-09-12 DIAGNOSIS — Z8781 Personal history of (healed) traumatic fracture: Secondary | ICD-10-CM

## 2019-09-12 DIAGNOSIS — M25551 Pain in right hip: Secondary | ICD-10-CM

## 2019-09-12 NOTE — Progress Notes (Signed)
Chief Complaint  Patient presents with  . Hip Pain    right     Pain right hip  Today pain is more over the greater trochanter patient request injection  Procedure note injection for right hip bursitis  Verbal consent was obtained for injection of the right hip  Timeout was completed to confirm the injection site  The medications used were 40 mg of Depo-Medrol and 1% lidocaine 3 cc  Anesthesia was provided by ethyl chloride and the skin was prepped with alcohol.  After cleaning the skin with alcohol a 25-gauge needle was used to inject the greater trochanteric bursa right hip   No complications were noted Encounter Diagnoses  Name Primary?  . Trochanteric bursitis, right hip Yes  . S/P ORIF (open reduction internal fixation) fracture rIght hip IM Nail 12/21/12   . Pain in right hip     FU 4 WEEKS

## 2019-09-12 NOTE — Patient Instructions (Signed)

## 2019-10-12 ENCOUNTER — Other Ambulatory Visit: Payer: Self-pay

## 2019-10-12 ENCOUNTER — Encounter: Payer: Self-pay | Admitting: Orthopedic Surgery

## 2019-10-12 ENCOUNTER — Ambulatory Visit (INDEPENDENT_AMBULATORY_CARE_PROVIDER_SITE_OTHER): Payer: Medicare PPO | Admitting: Orthopedic Surgery

## 2019-10-12 VITALS — BP 143/84 | HR 72 | Ht 59.0 in | Wt 113.0 lb

## 2019-10-12 DIAGNOSIS — M7061 Trochanteric bursitis, right hip: Secondary | ICD-10-CM | POA: Diagnosis not present

## 2019-10-12 NOTE — Patient Instructions (Signed)

## 2019-10-12 NOTE — Progress Notes (Signed)
Chief Complaint  Patient presents with  . Hip Pain    right     Patient comes in for requested injection right hip.  She says the pain comes and goes.  Today the pain is more proximal in the soft tissue above the greater trochanter.  This area is tender to palpation.  Patient agrees to right hip injection  Procedure note injection for right hip bursitis  Verbal consent was obtained for injection of the right hip  Timeout was completed to confirm the injection site  The medications used were 40 mg of Depo-Medrol and 1% lidocaine 3 cc  Anesthesia was provided by ethyl chloride and the skin was prepped with alcohol.  After cleaning the skin with alcohol a 25-gauge needle was used to inject the greater trochanteric bursa right hip   No complications were noted  Encounter Diagnosis  Name Primary?  . Trochanteric bursitis, right hip Yes

## 2019-11-08 DIAGNOSIS — E782 Mixed hyperlipidemia: Secondary | ICD-10-CM | POA: Diagnosis not present

## 2019-11-08 DIAGNOSIS — R030 Elevated blood-pressure reading, without diagnosis of hypertension: Secondary | ICD-10-CM | POA: Diagnosis not present

## 2019-11-09 ENCOUNTER — Encounter: Payer: Self-pay | Admitting: Orthopedic Surgery

## 2019-11-09 ENCOUNTER — Other Ambulatory Visit: Payer: Self-pay

## 2019-11-09 ENCOUNTER — Ambulatory Visit (INDEPENDENT_AMBULATORY_CARE_PROVIDER_SITE_OTHER): Payer: Medicare PPO | Admitting: Orthopedic Surgery

## 2019-11-09 VITALS — BP 132/71 | HR 70 | Ht 59.0 in | Wt 115.0 lb

## 2019-11-09 DIAGNOSIS — M7061 Trochanteric bursitis, right hip: Secondary | ICD-10-CM | POA: Diagnosis not present

## 2019-11-09 NOTE — Progress Notes (Signed)
Chief Complaint  Patient presents with  . Injections    right hip injection     Procedure note injection for right hip bursitis  Verbal consent was obtained for injection of the right hip  Timeout was completed to confirm the injection site  The medications used were 40 mg of Depo-Medrol and 1% lidocaine 3 cc  Anesthesia was provided by ethyl chloride and the skin was prepped with alcohol.  After cleaning the skin with alcohol a 25-gauge needle was used to inject the greater trochanteric bursa right hip   No complications were noted  Encounter Diagnosis  Name Primary?  . Trochanteric bursitis, right hip Yes

## 2019-11-15 DIAGNOSIS — G3184 Mild cognitive impairment, so stated: Secondary | ICD-10-CM | POA: Diagnosis not present

## 2019-11-15 DIAGNOSIS — E782 Mixed hyperlipidemia: Secondary | ICD-10-CM | POA: Diagnosis not present

## 2019-11-15 DIAGNOSIS — M1611 Unilateral primary osteoarthritis, right hip: Secondary | ICD-10-CM | POA: Diagnosis not present

## 2019-11-15 DIAGNOSIS — R634 Abnormal weight loss: Secondary | ICD-10-CM | POA: Diagnosis not present

## 2019-11-15 DIAGNOSIS — Z0000 Encounter for general adult medical examination without abnormal findings: Secondary | ICD-10-CM | POA: Diagnosis not present

## 2019-12-07 ENCOUNTER — Encounter: Payer: Self-pay | Admitting: Orthopedic Surgery

## 2019-12-07 ENCOUNTER — Ambulatory Visit: Payer: Medicare Other | Admitting: Orthopedic Surgery

## 2019-12-07 ENCOUNTER — Other Ambulatory Visit: Payer: Self-pay

## 2019-12-07 ENCOUNTER — Ambulatory Visit (INDEPENDENT_AMBULATORY_CARE_PROVIDER_SITE_OTHER): Payer: Medicare PPO | Admitting: Orthopedic Surgery

## 2019-12-07 VITALS — BP 120/75 | HR 72 | Temp 97.3°F | Ht 59.0 in | Wt 114.0 lb

## 2019-12-07 DIAGNOSIS — M7061 Trochanteric bursitis, right hip: Secondary | ICD-10-CM

## 2019-12-07 NOTE — Progress Notes (Signed)
Chief Complaint  Patient presents with  . Hip Pain    Recheck on right hip.injection     Encounter Diagnosis  Name Primary?  . Trochanteric bursitis, right hip Yes    Procedure note injection for right hip bursitis  Verbal consent was obtained for injection of the right hip  Timeout was completed to confirm the injection site  The medications used were 40 mg of Depo-Medrol and 1% lidocaine 3 cc  Anesthesia was provided by ethyl chloride and the skin was prepped with alcohol.  After cleaning the skin with alcohol a 25-gauge needle was used to inject the greater trochanteric bursa right hip   No complications were noted  Encounter Diagnosis  Name Primary?  . Trochanteric bursitis, right hip Yes

## 2019-12-07 NOTE — Patient Instructions (Signed)

## 2020-01-04 ENCOUNTER — Other Ambulatory Visit: Payer: Self-pay

## 2020-01-04 ENCOUNTER — Ambulatory Visit (INDEPENDENT_AMBULATORY_CARE_PROVIDER_SITE_OTHER): Payer: Medicare PPO | Admitting: Orthopedic Surgery

## 2020-01-04 VITALS — BP 96/66 | HR 65 | Temp 97.2°F | Ht 59.0 in | Wt 114.0 lb

## 2020-01-04 DIAGNOSIS — M545 Low back pain: Secondary | ICD-10-CM | POA: Diagnosis not present

## 2020-01-04 DIAGNOSIS — G8929 Other chronic pain: Secondary | ICD-10-CM | POA: Diagnosis not present

## 2020-01-04 MED ORDER — METHYLPREDNISOLONE ACETATE 40 MG/ML IJ SUSP
40.0000 mg | Freq: Once | INTRAMUSCULAR | Status: AC
  Administered 2020-01-04: 09:00:00 40 mg via INTRAMUSCULAR

## 2020-01-04 MED ORDER — GABAPENTIN 100 MG PO CAPS: 100.0000 mg | ORAL_CAPSULE | Freq: Three times a day (TID) | ORAL | 2 refills | Status: DC

## 2020-01-04 MED ORDER — MELOXICAM 7.5 MG PO TABS: 7.5000 mg | ORAL_TABLET | Freq: Every day | ORAL | 5 refills | Status: DC

## 2020-01-04 NOTE — Progress Notes (Signed)
Chief Complaint  Patient presents with  . Follow-up    Recheck on right hip.    Encounter Diagnosis  Name Primary?  . Chronic right-sided low back pain without sciatica Yes   Past Surgical History:  Procedure Laterality Date  . INTRAMEDULLARY (IM) NAIL INTERTROCHANTERIC Right 12/21/2012   Procedure: INTRAMEDULLARY (IM) NAIL INTERTROCHANTRIC;  Surgeon: Vickki Hearing, MD;  Location: [facility];  Service: Orthopedics;  Laterality: Right;  . SKIN BIOPSY     3x 2013  . TONSILLECTOMY      Mrs. Pounds came in for another injection in her right hip however she is complaining of pain on the iliac crest of the right hip with pain tracking to her lumbar spine  Based on this new symptom and the observation that her injections have been more proximal and off the trochanter decided to give her an IM shot instead send her for physical therapy and start her on gabapentin and meloxicam  She would like to get a second opinion from Dr. Dorris Carnes which is welcomed  Follow-up with me in 4 weeks  Meds ordered this encounter  Medications  . meloxicam (MOBIC) 7.5 MG tablet    Sig: Take 1 tablet (7.5 mg total) by mouth daily.    Dispense:  30 tablet    Refill:  5  . gabapentin (NEURONTIN) 100 MG capsule    Sig: Take 1 capsule (100 mg total) by mouth 3 (three) times daily.    Dispense:  90 capsule    Refill:  2  . methylPREDNISolone acetate (DEPO-MEDROL) injection 40 mg

## 2020-01-13 ENCOUNTER — Telehealth: Payer: Self-pay | Admitting: Orthopedic Surgery

## 2020-01-13 NOTE — Telephone Encounter (Signed)
-----   Message from [facility] May, RT sent at 01/13/2020  8:22 AM EDT ----- Regarding: RE: Advice Just come see me before you leave today.  ----- Message ----- From: Doristine Section Sent: 01/11/2020   2:11 PM EDT To: Toniann Fail May, RT Subject: Advice                                         Toniann Fail,  Per recent calls from patient #[id number] Rondell Reams, may we address some things, perhaps w/patient's PCP? Thank you

## 2020-01-13 NOTE — Telephone Encounter (Signed)
Per Toniann Fail May, okay to message patient's primary care provider, Dr Brooke Pace provider) regarding their reaching out to patient's son regarding possible additional support for patient.

## 2020-01-17 ENCOUNTER — Encounter (HOSPITAL_COMMUNITY): Payer: Self-pay | Admitting: Physical Therapy

## 2020-01-17 ENCOUNTER — Other Ambulatory Visit: Payer: Self-pay

## 2020-01-17 ENCOUNTER — Ambulatory Visit (HOSPITAL_COMMUNITY): Payer: Medicare PPO | Attending: Orthopedic Surgery | Admitting: Physical Therapy

## 2020-01-17 DIAGNOSIS — R2689 Other abnormalities of gait and mobility: Secondary | ICD-10-CM | POA: Diagnosis not present

## 2020-01-17 DIAGNOSIS — M545 Low back pain: Secondary | ICD-10-CM | POA: Insufficient documentation

## 2020-01-17 DIAGNOSIS — M25551 Pain in right hip: Secondary | ICD-10-CM | POA: Diagnosis not present

## 2020-01-17 DIAGNOSIS — G8929 Other chronic pain: Secondary | ICD-10-CM | POA: Diagnosis not present

## 2020-01-17 NOTE — Telephone Encounter (Signed)
Called and left message for Dr. Keane Police nurse at [phone], ext [phone], as discussed with Toniann Fail, clinic supervisor regarding having primary care and/or [facility] to reach out to patient's son, designated contact. (patient has appeared confused at last several appointments; also calls and stops by office frequently to ask about appointments.

## 2020-01-17 NOTE — Therapy (Signed)
[place], [place], [address] Phone: [phone]   Fax:  [phone]  Physical Therapy Evaluation  Patient Details  Name: Angela [name] MRN: [id number] Date of Birth: 1941-09-28 Referring Provider (PT): Arther Abbott MD    Encounter Date: 01/17/2020  PT End of Session - 01/17/20 0914    Visit Number  1    Number of Visits  18    Date for PT Re-Evaluation  02/17/20    Authorization Type  [employer]    Authorization Time Period  01/17/20-02/17/20 (patient will need [employer] re auth by 02/17/20 to complete MD ordered 18 visits, if continued POC is indicated)    Progress Note Due on Visit  10    PT Start Time  0825    PT Stop Time  0900    PT Time Calculation (min)  35 min    Activity Tolerance  Patient limited by fatigue    Behavior During Therapy  --   Some difficulty with attention to task, required max verbal cues and demo for objective testing      Past Medical History:  Diagnosis Date  . Clostridium difficile infection 2010  . Hip fx ([facility])   . Use of cane as ambulatory aid     Past Surgical History:  Procedure Laterality Date  . INTRAMEDULLARY (IM) NAIL INTERTROCHANTERIC Right 12/21/2012   Procedure: INTRAMEDULLARY (IM) NAIL INTERTROCHANTRIC;  Surgeon: Carole Civil, MD;  Location: [facility];  Service: Orthopedics;  Laterality: Right;  . SKIN BIOPSY     3x 2013  . TONSILLECTOMY      There were no vitals filed for this visit.   Subjective Assessment - 01/17/20 0829    Subjective  Patient presents to physical therapy with complaint of RT hip/ back pain. Says she fell getting her dog 3 years ago (reviewed records and appears to be in 2014). She says her hip was broken, spent 21 days in the hospital, then was sent home. Says this has been bothering her ever since. Patient says she is supposed to have new xrays this week. Says she feels the hardware from her hip rubbing, says this bothers her.  Patient says she was prescribed gabapentin and meloxicam recently, but says this has not been helpful. Says she hobbles when she walks and it is "maddening".    Pertinent History  RT hip ORIF in 2019    Limitations  Lifting;Standing;Walking;House hold activities    How long can you stand comfortably?  20 minutes    How long can you walk comfortably?  20 minutes    Diagnostic tests  xrays    Patient Stated Goals  "I don't know"    Currently in Pain?  Yes    Pain Score  2     Pain Location  Hip    Pain Orientation  Right    Pain Descriptors / Indicators  Aching;Dull    Pain Type  Chronic pain    Pain Onset  More than a month ago    Pain Frequency  Intermittent    Aggravating Factors   standing, walking, stairs    Pain Relieving Factors  sitting    Effect of Pain on Daily Activities  limiting         [facility] PT Assessment - 01/17/20 0001      Assessment   Medical Diagnosis  LBP    Referring Provider (PT)  Arther Abbott MD     Onset Date/Surgical  Date  --   chronic, ongoing, over 5 years   Next MD Visit  02/08/20    Prior Therapy  yes      Precautions   Precautions  Fall      Restrictions   Weight Bearing Restrictions  No      Balance Screen   Has the patient fallen in the past 6 months  Yes    How many times?  3    Has the patient had a decrease in activity level because of a fear of falling?   No    Is the patient reluctant to leave their home because of a fear of falling?   No      Home Environment   Living Environment  Private residence    Living Arrangements  Children   currently son lives with her    Type of Home  House    Home Access  Level entry    Home Layout  Two level;Able to live on main level with bedroom/bathroom      Prior Function   Level of Independence  Independent with basic ADLs   uses SPC PRN     Cognition   Overall Cognitive Status  Within Functional Limits for tasks assessed   possibly some difficulty with memory recall      Observation/Other Assessments   Focus on Therapeutic Outcomes (FOTO)   perform at next visit       Posture/Postural Control   Posture/Postural Control  Postural limitations    Postural Limitations  Forward head;Increased thoracic kyphosis;Flexed trunk      ROM / Strength   AROM / PROM / Strength  AROM;Strength      Strength   Overall Strength Comments  hip abduction and extension tested in standing     Strength Assessment Site  Hip;Knee;Ankle    Right/Left Hip  Right;Left    Right Hip Flexion  4+/5    Right Hip Extension  4-/5    Right Hip ABduction  3+/5    Left Hip Flexion  5/5    Left Hip Extension  4-/5    Left Hip ABduction  4-/5    Right/Left Knee  Right;Left    Right Knee Flexion  4/5    Right Knee Extension  5/5    Left Knee Flexion  4/5    Left Knee Extension  5/5    Right/Left Ankle  Right;Left    Right Ankle Dorsiflexion  5/5    Left Ankle Dorsiflexion  5/5      Ambulation/Gait   Ambulation/Gait  Yes    Ambulation/Gait Assistance  6: Modified independent (Device/Increase time)    Assistive device  None    Gait Pattern  Decreased step length - right;Decreased step length - left;Decreased stance time - right;Decreased stride length;Narrow base of support;Trunk flexed    Ambulation Surface  Level;Indoor      Balance   Balance Assessed  Yes      Static Standing Balance   Static Standing Balance -  Activities   Tandam Stance - Right Leg;Tandam Stance - Left Leg    Static Standing - Comment/# of Minutes  Patient unable to perform tandem and had difficutly following cues for testing, even with max verbal cues and demo. Patient was able to perform small step forward and hold about 15 seconds each with min sway but with noted increase in muscle fatigue                Objective  measurements completed on examination: See above findings.              PT Education - 01/17/20 0835    Education Details  on evaluation findings and POC    Person(s)  Educated  Patient    Methods  Explanation    Comprehension  Verbalized understanding       PT Short Term Goals - 01/17/20 0916      PT SHORT TERM GOAL #1   Title  Patient will be independent with initial HEP to improve functional outcomes    Time  3    Period  Weeks    Status  New    Target Date  02/10/20      PT SHORT TERM GOAL #2   Title  Patient will be able to ambulate at least 150 feet during with LRAD to demonstrate improved ability to perform functional mobility and associated tasks.    Time  3    Period  Weeks    Status  New    Target Date  02/10/20        PT Long Term Goals - 01/17/20 0917      PT LONG TERM GOAL #1   Title  Patient will demonstrate an improvement on FOTO of 10% as evidence of patient's perceived improvement in functional mobility.     Time  6    Period  Weeks    Status  New    Target Date  03/02/20      PT LONG TERM GOAL #2   Title  Patient will be able to ambulate at least 250 feet during with LRAD to demonstrate improved ability to perform functional mobility and associated tasks.    Time  6    Period  Weeks    Status  New    Target Date  03/02/20      PT LONG TERM GOAL #3   Title  Patient will have equal to or > 4/5 MMT throughout BLE to improve ability to perform functional mobility, stair ambulation and ADLs.    Time  6    Period  Weeks    Status  New    Target Date  03/02/20      PT LONG TERM GOAL #4   Title  Patient will be able to perform stand x 5 in < 15 seconds to demonstrate improvement in functional mobility and reduced risk for falls.    Time  6    Period  Weeks    Status  New    Target Date  03/02/20             Plan - 01/17/20 0850    Clinical Impression Statement  Patient is a 79 y.o. female who presents to physical therapy with complaint of low back, RT hip pain, as well as history of falls. Subjective history somewhat difficult to obtain as patient gives extended answers and tends to digress form  subject matter. Objective measures abbreviated as patient arrived late today, and has difficulty following testing cues with max verbal cues and demo. Will assess at next session. Currently, patient demonstrates decreased strength, balance deficits and gait abnormalities which are likely contributing to symptoms of pain and are negatively impacting patient ability to perform ADLs and functional mobility tasks. Patient will benefit from skilled physical therapy services to address these deficits to reduce pain, improve level of function with ADLs, functional mobility tasks, and reduce risk for falls.    Personal  Factors and Comorbidities  Age;Time since onset of injury/illness/exacerbation    Examination-Activity Limitations  Bathing;Squat;Bend;Bed Mobility;Stairs;Stand;Transfers;Dressing;Hygiene/Grooming;Lift;Locomotion Level    Examination-Participation Restrictions  Community Activity;Volunteer;Laundry;Yard Work    Conservation officer, historic buildings  Stable/Uncomplicated    Optometrist  Low    Rehab Potential  Fair    PT Frequency  3x / week    PT Duration  6 weeks    PT Treatment/Interventions  ADLs/Self Care Home Management;Aquatic Therapy;Biofeedback;Cryotherapy;Electrical Stimulation;Iontophoresis 4mg /ml Dexamethasone;Moist Heat;Traction;Balance training;Manual techniques;Therapeutic exercise;Vestibular;Splinting;Taping;Vasopneumatic Device;Functional mobility training;Therapeutic activities;Orthotic Fit/Training;Gait training;Stair training;DME Instruction;Patient/family education;Energy conservation;Dry needling;Joint Manipulations;Spinal Manipulations;Passive range of motion;Ultrasound;Parrafin;Fluidtherapy;Contrast Bath;Neuromuscular re-education;Compression bandaging    PT Next Visit Plan  Review goals, Complete FOTO, perform 5 x STS and 2 MWT. Issue HEP for hip/ core strengthening, progress as tolerated    PT Home Exercise Plan  Issue at next visit    Consulted and Agree with  Plan of Care  Patient       Patient will benefit from skilled therapeutic intervention in order to improve the following deficits and impairments:  Abnormal gait, Decreased endurance, Hypomobility, Decreased activity tolerance, Decreased strength, Pain, Decreased balance, Decreased mobility, Difficulty walking, Decreased range of motion, Postural dysfunction, Improper body mechanics  Visit Diagnosis: Chronic low back pain, unspecified back pain laterality, unspecified whether sciatica present  Pain in right hip  Other abnormalities of gait and mobility     Problem List Patient Active Problem List   Diagnosis Date Noted  . S/P ORIF (open reduction internal fixation) fracture rIght hip IM Nail 12/21/12 12/14/2017  . Effusion of knee joint 10/05/2014  . Acute blood loss anemia 12/22/2012  . Closed hip fracture ([facility]) 12/21/2012  . Intertrochanteric fracture of right hip ([facility]) 12/21/2012   9:26 AM, 01/17/20 01/19/20 PT DPT  Physical Therapist with [facility]  [facility]  [phone]   [facility] [address] [place], [place], [place] Phone: [phone]   Fax:  [phone]  Name: Angela [name] MRN: Verne Grain Date of Birth: 04-24-1941

## 2020-01-17 NOTE — Telephone Encounter (Signed)
Call received from [facility], Dr Scharlene Gloss nurse. Relayed; she will speak with Dr Margo Aye.

## 2020-01-18 ENCOUNTER — Encounter (HOSPITAL_COMMUNITY): Payer: Self-pay | Admitting: Physical Therapy

## 2020-01-18 ENCOUNTER — Ambulatory Visit (HOSPITAL_COMMUNITY): Payer: Medicare PPO | Admitting: Physical Therapy

## 2020-01-18 DIAGNOSIS — R2689 Other abnormalities of gait and mobility: Secondary | ICD-10-CM | POA: Diagnosis not present

## 2020-01-18 DIAGNOSIS — N3090 Cystitis, unspecified without hematuria: Secondary | ICD-10-CM | POA: Diagnosis not present

## 2020-01-18 DIAGNOSIS — M1611 Unilateral primary osteoarthritis, right hip: Secondary | ICD-10-CM | POA: Diagnosis not present

## 2020-01-18 DIAGNOSIS — G3184 Mild cognitive impairment, so stated: Secondary | ICD-10-CM | POA: Diagnosis not present

## 2020-01-18 DIAGNOSIS — M25551 Pain in right hip: Secondary | ICD-10-CM

## 2020-01-18 DIAGNOSIS — R531 Weakness: Secondary | ICD-10-CM | POA: Diagnosis not present

## 2020-01-18 DIAGNOSIS — N390 Urinary tract infection, site not specified: Secondary | ICD-10-CM | POA: Diagnosis not present

## 2020-01-18 DIAGNOSIS — Z0000 Encounter for general adult medical examination without abnormal findings: Secondary | ICD-10-CM | POA: Diagnosis not present

## 2020-01-18 DIAGNOSIS — R030 Elevated blood-pressure reading, without diagnosis of hypertension: Secondary | ICD-10-CM | POA: Diagnosis not present

## 2020-01-18 DIAGNOSIS — M129 Arthropathy, unspecified: Secondary | ICD-10-CM | POA: Diagnosis not present

## 2020-01-18 DIAGNOSIS — R410 Disorientation, unspecified: Secondary | ICD-10-CM | POA: Diagnosis not present

## 2020-01-18 DIAGNOSIS — R634 Abnormal weight loss: Secondary | ICD-10-CM | POA: Diagnosis not present

## 2020-01-18 DIAGNOSIS — R809 Proteinuria, unspecified: Secondary | ICD-10-CM | POA: Diagnosis not present

## 2020-01-18 DIAGNOSIS — M545 Low back pain, unspecified: Secondary | ICD-10-CM

## 2020-01-18 DIAGNOSIS — G8929 Other chronic pain: Secondary | ICD-10-CM | POA: Diagnosis not present

## 2020-01-18 DIAGNOSIS — E663 Overweight: Secondary | ICD-10-CM | POA: Diagnosis not present

## 2020-01-18 DIAGNOSIS — M170 Bilateral primary osteoarthritis of knee: Secondary | ICD-10-CM | POA: Diagnosis not present

## 2020-01-18 DIAGNOSIS — H6010 Cellulitis of external ear, unspecified ear: Secondary | ICD-10-CM | POA: Diagnosis not present

## 2020-01-18 DIAGNOSIS — H6011 Cellulitis of right external ear: Secondary | ICD-10-CM | POA: Diagnosis not present

## 2020-01-18 DIAGNOSIS — E782 Mixed hyperlipidemia: Secondary | ICD-10-CM | POA: Diagnosis not present

## 2020-01-18 NOTE — Therapy (Signed)
[facility] [address] [place], [place], [address] Phone: [phone]   Fax:  [phone]  Physical Therapy Treatment  Patient Details  Name: Angela [name] MRN: [id number] Date of Birth: Jan 17, 1941 Referring Provider (PT): Fuller Canada MD    Encounter Date: 01/18/2020  PT End of Session - 01/18/20 0826    Visit Number  2    Number of Visits  18    Date for PT Re-Evaluation  02/17/20    Authorization Type  [employer] Medicare    Authorization Time Period  01/17/20-02/17/20 (patient will need [employer] re auth by 02/17/20 to complete MD ordered 18 visits, if continued POC is indicated)    Progress Note Due on Visit  10    PT Start Time  0820    PT Stop Time  0855    PT Time Calculation (min)  35 min    Equipment Utilized During Treatment  Gait belt    Activity Tolerance  Patient limited by fatigue;Patient tolerated treatment well    Behavior During Therapy  [facility] for tasks assessed/performed       Past Medical History:  Diagnosis Date  . Clostridium difficile infection 2010  . Hip fx ([facility])   . Use of cane as ambulatory aid     Past Surgical History:  Procedure Laterality Date  . INTRAMEDULLARY (IM) NAIL INTERTROCHANTERIC Right 12/21/2012   Procedure: INTRAMEDULLARY (IM) NAIL INTERTROCHANTRIC;  Surgeon: Vickki Hearing, MD;  Location: [facility];  Service: Orthopedics;  Laterality: Right;  . SKIN BIOPSY     3x 2013  . TONSILLECTOMY      There were no vitals filed for this visit.  Subjective Assessment - 01/18/20 0825    Subjective  Patient says she is having more pain in RT hip today. Says she woke up in tears today. Says it does ease up some when she gets up and moving, but says she had a rough morning and did not sleep well.    Pertinent History  RT hip ORIF in 2019    Limitations  Lifting;Standing;Walking;House hold activities    How long can you stand comfortably?  20 minutes    How long can you walk comfortably?  20 minutes     Diagnostic tests  xrays    Patient Stated Goals  "I don't know"    Currently in Pain?  Yes    Pain Score  4     Pain Location  Hip    Pain Orientation  Right    Pain Descriptors / Indicators  Aching    Pain Type  Chronic pain    Pain Onset  More than a month ago         [facility] PT Assessment - 01/18/20 0001      Observation/Other Assessments   Focus on Therapeutic Outcomes (FOTO)   46% limited       Transfers   Five time sit to stand comments   12.25 sec with no UES      Ambulation/Gait   Ambulation/Gait  Yes    Ambulation/Gait Assistance  6: Modified independent (Device/Increase time)    Ambulation Distance (Feet)  226 Feet    Assistive device  None    Gait Pattern  Decreased step length - right;Decreased step length - left;Decreased stance time - right;Decreased stride length;Narrow base of support;Trunk flexed    Ambulation Surface  Level;Indoor  PT Short Term Goals - 01/17/20 0916      PT SHORT TERM GOAL #1   Title  Patient will be independent with initial HEP to improve functional outcomes    Time  3    Period  Weeks    Status  New    Target Date  02/10/20      PT SHORT TERM GOAL #2   Title  Patient will be able to ambulate at least 150 feet during 2MWT with LRAD to demonstrate improved ability to perform functional mobility and associated tasks.    Time  3    Period  Weeks    Status  New    Target Date  02/10/20        PT Long Term Goals - 01/17/20 0917      PT LONG TERM GOAL #1   Title  Patient will demonstrate an improvement on FOTO of 10% as evidence of patient's perceived improvement in functional mobility.     Time  6    Period  Weeks    Status  New    Target Date  03/02/20      PT LONG TERM GOAL #2   Title  Patient will be able to ambulate at least 250 feet during 2MWT with LRAD to demonstrate improved ability to perform functional mobility and associated tasks.    Time  6    Period  Weeks     Status  New    Target Date  03/02/20      PT LONG TERM GOAL #3   Title  Patient will have equal to or > 4/5 MMT throughout BLE to improve ability to perform functional mobility, stair ambulation and ADLs.    Time  6    Period  Weeks    Status  New    Target Date  03/02/20      PT LONG TERM GOAL #4   Title  Patient will be able to perform stand x 5 in < 15 seconds to demonstrate improvement in functional mobility and reduced risk for falls.    Time  6    Period  Weeks    Status  New    Target Date  03/02/20            Plan - 01/18/20 0857    Clinical Impression Statement  Performed follow up testing this session. Completed FOTO, 2MWT, and STS x 5. Patient tolerated session well today. Patient continues to be limited in functional mobility by decreased gait speed, LE weakness and continues to show some difficulty with static balance. Patient did not report increased pain in RT hip during todays session.    Personal Factors and Comorbidities  Age;Time since onset of injury/illness/exacerbation    Examination-Activity Limitations  Bathing;Squat;Bend;Bed Mobility;Stairs;Stand;Transfers;Dressing;Hygiene/Grooming;Lift;Locomotion Level    Examination-Participation Restrictions  Community Activity;Volunteer;Laundry;Yard Work    Stability/Clinical Decision Making  Stable/Uncomplicated    Rehab Potential  Fair    PT Frequency  3x / week    PT Duration  6 weeks    PT Treatment/Interventions  ADLs/Self Care Home Management;Aquatic Therapy;Biofeedback;Cryotherapy;Electrical Stimulation;Iontophoresis 4mg /ml Dexamethasone;Moist Heat;Traction;Balance training;Manual techniques;Therapeutic exercise;Vestibular;Splinting;Taping;Vasopneumatic Device;Functional mobility training;Therapeutic activities;Orthotic Fit/Training;Gait training;Stair training;DME Instruction;Patient/family education;Energy conservation;Dry needling;Joint Manipulations;Spinal Manipulations;Passive range of  motion;Ultrasound;Parrafin;Fluidtherapy;Contrast Bath;Neuromuscular re-education;Compression bandaging    PT Next Visit Plan  Review goals. Issue HEP for hip/ core strengthening, progress as tolerated    PT Home Exercise Plan  Issue at next visit    Consulted and Agree with Plan of Care  Patient       Patient will benefit from skilled therapeutic intervention in order to improve the following deficits and impairments:  Abnormal gait, Decreased endurance, Hypomobility, Decreased activity tolerance, Decreased strength, Pain, Decreased balance, Decreased mobility, Difficulty walking, Decreased range of motion, Postural dysfunction, Improper body mechanics  Visit Diagnosis: Chronic low back pain, unspecified back pain laterality, unspecified whether sciatica present  Pain in right hip  Other abnormalities of gait and mobility     Problem List Patient Active Problem List   Diagnosis Date Noted  . S/P ORIF (open reduction internal fixation) fracture rIght hip IM Nail 12/21/12 12/14/2017  . Effusion of knee joint 10/05/2014  . Acute blood loss anemia 12/22/2012  . Closed hip fracture ([facility]) 12/21/2012  . Intertrochanteric fracture of right hip ([facility]) 12/21/2012   8:59 AM, 01/18/20 Georges Lynch PT DPT  Physical Therapist with [place]  [facility]  [phone]   [facility] [address] [place], [place], [address] Phone: [phone]   Fax:  [phone]  Name: Angela [name] MRN: [id number] Date of Birth: 24-Mar-1941

## 2020-01-24 ENCOUNTER — Ambulatory Visit (HOSPITAL_COMMUNITY): Payer: Medicare PPO | Admitting: Physical Therapy

## 2020-01-24 ENCOUNTER — Other Ambulatory Visit: Payer: Self-pay

## 2020-01-24 DIAGNOSIS — M25551 Pain in right hip: Secondary | ICD-10-CM | POA: Diagnosis not present

## 2020-01-24 DIAGNOSIS — G8929 Other chronic pain: Secondary | ICD-10-CM

## 2020-01-24 DIAGNOSIS — M545 Low back pain: Secondary | ICD-10-CM | POA: Diagnosis not present

## 2020-01-24 DIAGNOSIS — R2689 Other abnormalities of gait and mobility: Secondary | ICD-10-CM

## 2020-01-24 NOTE — Therapy (Signed)
[facility] [address] [place], [place], [address] Phone: [phone]   Fax:  [phone]  Physical Therapy Treatment  Patient Details  Name: Angela [name] MRN: [id number] Date of Birth: 09/03/1941 Referring Provider (PT): Fuller Canada MD    Encounter Date: 01/24/2020    Past Medical History:  Diagnosis Date  . Clostridium difficile infection 2010  . Hip fx (HCC)   . Use of cane as ambulatory aid     Past Surgical History:  Procedure Laterality Date  . INTRAMEDULLARY (IM) NAIL INTERTROCHANTERIC Right 12/21/2012   Procedure: INTRAMEDULLARY (IM) NAIL INTERTROCHANTRIC;  Surgeon: Vickki Hearing, MD;  Location: [facility];  Service: Orthopedics;  Laterality: Right;  . SKIN BIOPSY     3x 2013  . TONSILLECTOMY      There were no vitals filed for this visit.  Subjective Assessment - 01/24/20 1045    Subjective  pt states her Rt hip has hurt since her surgery in 2019.  STates she is unable to don/doff her sock or shoe on that side and stays in constant pain.  Sitting still is 8/10 and increases with ambulation.    Currently in Pain?  Yes    Pain Score  8     Pain Location  Hip    Pain Orientation  Right    Pain Descriptors / Indicators  Aching;Stabbing;Throbbing;Tightness                       [facility] Adult PT Treatment/Exercise - 01/24/20 0001      Knee/Hip Exercises: Stretches   Active Hamstring Stretch  Right;3 reps;30 seconds    Active Hamstring Stretch Limitations  long sitting      Knee/Hip Exercises: Supine   Bridges  10 reps    Straight Leg Raises  10 reps      Knee/Hip Exercises: Sidelying   Hip ABduction  10 reps             PT Education - 01/24/20 1143    Education Details  reveiwed goals and POC moving forward.  initated HEP    Person(s) Educated  Patient    Methods  Explanation;Demonstration;Tactile cues;Verbal cues;Handout    Comprehension  Verbalized understanding;Returned  demonstration;Verbal cues required;Tactile cues required       PT Short Term Goals - 01/17/20 0916      PT SHORT TERM GOAL #1   Title  Patient will be independent with initial HEP to improve functional outcomes    Time  3    Period  Weeks    Status  New    Target Date  02/10/20      PT SHORT TERM GOAL #2   Title  Patient will be able to ambulate at least 150 feet during with LRAD to demonstrate improved ability to perform functional mobility and associated tasks.    Time  3    Period  Weeks    Status  New    Target Date  02/10/20        PT Long Term Goals - 01/17/20 0917      PT LONG TERM GOAL #1   Title  Patient will demonstrate an improvement on FOTO of 10% as evidence of patient's perceived improvement in functional mobility.     Time  6    Period  Weeks    Status  New    Target Date  03/02/20      PT LONG TERM GOAL #  2   Title  Patient will be able to ambulate at least 250 feet during 2MWT with LRAD to demonstrate improved ability to perform functional mobility and associated tasks.    Time  6    Period  Weeks    Status  New    Target Date  03/02/20      PT LONG TERM GOAL #3   Title  Patient will have equal to or > 4/5 MMT throughout BLE to improve ability to perform functional mobility, stair ambulation and ADLs.    Time  6    Period  Weeks    Status  New    Target Date  03/02/20      PT LONG TERM GOAL #4   Title  Patient will be able to perform stand x 5 in < 15 seconds to demonstrate improvement in functional mobility and reduced risk for falls.    Time  6    Period  Weeks    Status  New    Target Date  03/02/20            Plan - 01/24/20 1143    Clinical Impression Statement  reviewed goals and POC moving forward.  Pt is very stiff and weak due to no activity other than walking.  Initiated HEP to include long sitting hamstring stretch, global hip strengthening (except add) and bridge to target glutes.  pt is extremely tight and weak and took  extended time to get into positions and encouragement to push through exercises.  discussed importance of keeping these up at home to improve dysfunctions.  worked on proper gait with patient as well due to antalgic gait pattern and tendency to shuffle steps.  Able to imporve overall gait quality with cues.    Personal Factors and Comorbidities  Age;Time since onset of injury/illness/exacerbation    Examination-Activity Limitations  Bathing;Squat;Bend;Bed Mobility;Stairs;Stand;Transfers;Dressing;Hygiene/Grooming;Lift;Locomotion Level    Examination-Participation Restrictions  Community Activity;Volunteer;Laundry;Yard Work    Stability/Clinical Decision Making  Stable/Uncomplicated    Rehab Potential  Fair    PT Frequency  3x / week    PT Duration  6 weeks    PT Treatment/Interventions  ADLs/Self Care Home Management;Aquatic Therapy;Biofeedback;Cryotherapy;Electrical Stimulation;Iontophoresis 4mg /ml Dexamethasone;Moist Heat;Traction;Balance training;Manual techniques;Therapeutic exercise;Vestibular;Splinting;Taping;Vasopneumatic Device;Functional mobility training;Therapeutic activities;Orthotic Fit/Training;Gait training;Stair training;DME Instruction;Patient/family education;Energy conservation;Dry needling;Joint Manipulations;Spinal Manipulations;Passive range of motion;Ultrasound;Parrafin;Fluidtherapy;Contrast Bath;Neuromuscular re-education;Compression bandaging    PT Next Visit Plan  progress as tolerated; encourage movement and improved ambulation.    PT Home Exercise Plan  4/20: hamstring stretch, prone hip extension, sidelying hip abduction, bridge, SLR    Consulted and Agree with Plan of Care  Patient       Patient will benefit from skilled therapeutic intervention in order to improve the following deficits and impairments:  Abnormal gait, Decreased endurance, Hypomobility, Decreased activity tolerance, Decreased strength, Pain, Decreased balance, Decreased mobility, Difficulty walking,  Decreased range of motion, Postural dysfunction, Improper body mechanics  Visit Diagnosis: Pain in right hip  Chronic low back pain, unspecified back pain laterality, unspecified whether sciatica present  Other abnormalities of gait and mobility     Problem List Patient Active Problem List   Diagnosis Date Noted  . S/P ORIF (open reduction internal fixation) fracture rIght hip IM Nail 12/21/12 12/14/2017  . Effusion of knee joint 10/05/2014  . Acute blood loss anemia 12/22/2012  . Closed hip fracture ([place]) 12/21/2012  . Intertrochanteric fracture of right hip ([place]) 12/21/2012   Teena Irani, PTA/CLT [place], Aamiyah Derrick B 01/24/2020, 12:04 PM  [facility] [address] [place], [place], [address] Phone: [phone]   Fax:  [phone]  Name: Angela [name] MRN: [id number] Date of Birth: June 28, 1941

## 2020-01-24 NOTE — Patient Instructions (Signed)
Hamstring Stretch (Sitting)    Sitting, extend RIGHT leg and place hands on same thigh for support. Keeping torso straight, lean forward, sliding hands down leg, until a stretch is felt in back of thigh.  Hold _30___ seconds.  Repeat __3__times, __2__X a day.    HIP / KNEE: Extension - Prone    Squeeze glutes. Raise leg up. Keep knee straight. _10__ reps per set, _2__ sets per day   Abduction: Side Leg Lift Bridge    Lie back, legs bent. Inhale, pressing hips up. Keeping ribs in, lengthen lower back. Exhale, rolling down along spine from top. Repeat _10___ times. Do __2__ sessions per day.   Straight Leg Raise    Tighten stomach and slowly raise locked right leg to knee level of Left leg.  Repeat _10__ times per set. Do _2___ sets per day.   - Side-Lying    Lie on side. Lift top leg slightly higher than shoulder level. Keep top leg straight with body, toes pointing forward. _10__ reps per set, _2__ sets per day

## 2020-01-26 ENCOUNTER — Ambulatory Visit (HOSPITAL_COMMUNITY): Payer: Medicare PPO | Admitting: Physical Therapy

## 2020-01-26 ENCOUNTER — Other Ambulatory Visit: Payer: Self-pay

## 2020-01-26 ENCOUNTER — Encounter (HOSPITAL_COMMUNITY): Payer: Self-pay | Admitting: Physical Therapy

## 2020-01-26 DIAGNOSIS — G8929 Other chronic pain: Secondary | ICD-10-CM | POA: Diagnosis not present

## 2020-01-26 DIAGNOSIS — R2689 Other abnormalities of gait and mobility: Secondary | ICD-10-CM

## 2020-01-26 DIAGNOSIS — M25551 Pain in right hip: Secondary | ICD-10-CM | POA: Diagnosis not present

## 2020-01-26 DIAGNOSIS — M545 Low back pain, unspecified: Secondary | ICD-10-CM

## 2020-01-26 NOTE — Therapy (Signed)
[facility] [address] [place], [place], [address] Phone: [phone]   Fax:  [phone]  Physical Therapy Treatment  Patient Details  Name: Angela [name] MRN: [id number] Date of Birth: Jun 24, 1941 Referring Provider (PT): Fuller Canada MD    Encounter Date: 01/26/2020  PT End of Session - 01/26/20 1352    Visit Number  3    Number of Visits  18    Date for PT Re-Evaluation  02/17/20    Authorization Type  [employer]    Authorization Time Period  4/12-21-02/17/20    Authorization - Visit Number  3    Authorization - Number of Visits  12    Progress Note Due on Visit  10    PT Start Time  1346    PT Stop Time  1425    PT Time Calculation (min)  39 min    Equipment Utilized During Treatment  Gait belt    Activity Tolerance  Patient limited by fatigue;Patient tolerated treatment well    Behavior During Therapy  [facility] for tasks assessed/performed       Past Medical History:  Diagnosis Date  . Clostridium difficile infection 2010  . Hip fx (HCC)   . Use of cane as ambulatory aid     Past Surgical History:  Procedure Laterality Date  . INTRAMEDULLARY (IM) NAIL INTERTROCHANTERIC Right 12/21/2012   Procedure: INTRAMEDULLARY (IM) NAIL INTERTROCHANTRIC;  Surgeon: Vickki Hearing, MD;  Location: [facility];  Service: Orthopedics;  Laterality: Right;  . SKIN BIOPSY     3x 2013  . TONSILLECTOMY      There were no vitals filed for this visit.  Subjective Assessment - 01/26/20 1351    Subjective  Patient says her hip is not well at all. Says she can feel it move around especially when going over bump in car. Says she can hear it.    Currently in Pain?  Yes    Pain Score  8     Pain Location  Hip    Pain Orientation  Right    Pain Descriptors / Indicators  Aching;Throbbing;Tightness    Pain Type  Chronic pain                       [facility] Adult PT Treatment/Exercise - 01/26/20 0001      Knee/Hip Exercises:  Supine   Short Arc Quad Sets  Both;10 reps    Short Arc Quad Sets Limitations  5 sec hold    Heel Slides  Both;10 reps    Straight Leg Raises  5 reps    Straight Leg Raises Limitations  unable to complete set on RT too difficult    Other Supine Knee/Hip Exercises  glute set 10 x 5"      Manual Therapy   Manual Therapy  Soft tissue mobilization;Passive ROM    Manual therapy comments  completed separate rest of treatment    Soft tissue mobilization  IASTM using tennis ball, gentle rolling to lateral RT hip and about trochanter   patient in LT sidelying   Passive ROM  gentle, partial range PROM RT hip flexion and ER to tolerance               PT Short Term Goals - 01/17/20 0916      PT SHORT TERM GOAL #1   Title  Patient will be independent with initial HEP to improve functional outcomes    Time  3    Period  Weeks    Status  New    Target Date  02/10/20      PT SHORT TERM GOAL #2   Title  Patient will be able to ambulate at least 150 feet during 2MWT with LRAD to demonstrate improved ability to perform functional mobility and associated tasks.    Time  3    Period  Weeks    Status  New    Target Date  02/10/20        PT Long Term Goals - 01/17/20 0917      PT LONG TERM GOAL #1   Title  Patient will demonstrate an improvement on FOTO of 10% as evidence of patient's perceived improvement in functional mobility.     Time  6    Period  Weeks    Status  New    Target Date  03/02/20      PT LONG TERM GOAL #2   Title  Patient will be able to ambulate at least 250 feet during 2MWT with LRAD to demonstrate improved ability to perform functional mobility and associated tasks.    Time  6    Period  Weeks    Status  New    Target Date  03/02/20      PT LONG TERM GOAL #3   Title  Patient will have equal to or > 4/5 MMT throughout BLE to improve ability to perform functional mobility, stair ambulation and ADLs.    Time  6    Period  Weeks    Status  New    Target Date   03/02/20      PT LONG TERM GOAL #4   Title  Patient will be able to perform stand x 5 in < 15 seconds to demonstrate improvement in functional mobility and reduced risk for falls.    Time  6    Period  Weeks    Status  New    Target Date  03/02/20            Plan - 01/26/20 1429    Clinical Impression Statement  Patient continues to be guarded with treatment. Required frequent encouragement for attention to and completion of indicated exercise. Patient was challenged with SLR today, unable to complete full set, activity modified to SAQ. Patient tolerated this well, required verbal and tactile cues for proper form. Patient performs all activity in slow, guarded manner. Added manual STM to RT hip musculature and gentle PROM to address muscle restriction, pain and stiffness. Patient noted some decreased pain post treatment.    Personal Factors and Comorbidities  Age;Time since onset of injury/illness/exacerbation    Examination-Activity Limitations  Bathing;Squat;Bend;Bed Mobility;Stairs;Stand;Transfers;Dressing;Hygiene/Grooming;Lift;Locomotion Level    Examination-Participation Restrictions  Community Activity;Volunteer;Laundry;Yard Work    Stability/Clinical Decision Making  Stable/Uncomplicated    Rehab Potential  Fair    PT Frequency  3x / week    PT Duration  6 weeks    PT Treatment/Interventions  ADLs/Self Care Home Management;Aquatic Therapy;Biofeedback;Cryotherapy;Electrical Stimulation;Iontophoresis 4mg /ml Dexamethasone;Moist Heat;Traction;Balance training;Manual techniques;Therapeutic exercise;Vestibular;Splinting;Taping;Vasopneumatic Device;Functional mobility training;Therapeutic activities;Orthotic Fit/Training;Gait training;Stair training;DME Instruction;Patient/family education;Energy conservation;Dry needling;Joint Manipulations;Spinal Manipulations;Passive range of motion;Ultrasound;Parrafin;Fluidtherapy;Contrast Bath;Neuromuscular re-education;Compression bandaging    PT  Next Visit Plan  progress as tolerated; encourage movement and improved ambulation.    PT Home Exercise Plan  4/20: hamstring stretch, prone hip extension, sidelying hip abduction, bridge, SLR    Consulted and Agree with Plan of Care  Patient       Patient will  benefit from skilled therapeutic intervention in order to improve the following deficits and impairments:  Abnormal gait, Decreased endurance, Hypomobility, Decreased activity tolerance, Decreased strength, Pain, Decreased balance, Decreased mobility, Difficulty walking, Decreased range of motion, Postural dysfunction, Improper body mechanics  Visit Diagnosis: Pain in right hip  Chronic low back pain, unspecified back pain laterality, unspecified whether sciatica present  Other abnormalities of gait and mobility     Problem List Patient Active Problem List   Diagnosis Date Noted  . S/P ORIF (open reduction internal fixation) fracture rIght hip IM Nail 12/21/12 12/14/2017  . Effusion of knee joint 10/05/2014  . Acute blood loss anemia 12/22/2012  . Closed hip fracture (HCC) 12/21/2012  . Intertrochanteric fracture of right hip (HCC) 12/21/2012   2:30 PM, 01/26/20 Georges Lynch PT DPT  Physical Therapist with [place]  [facility]  [phone]   [facility] [address] [place], [place], [address] Phone: [phone]   Fax:  [phone]  Name: Angela [name] MRN: [id number] Date of Birth: 1941/05/19

## 2020-01-31 ENCOUNTER — Ambulatory Visit (HOSPITAL_COMMUNITY): Payer: Medicare PPO | Admitting: Physical Therapy

## 2020-01-31 ENCOUNTER — Other Ambulatory Visit: Payer: Self-pay

## 2020-01-31 DIAGNOSIS — M545 Low back pain, unspecified: Secondary | ICD-10-CM

## 2020-01-31 DIAGNOSIS — R2689 Other abnormalities of gait and mobility: Secondary | ICD-10-CM

## 2020-01-31 DIAGNOSIS — M25551 Pain in right hip: Secondary | ICD-10-CM | POA: Diagnosis not present

## 2020-01-31 DIAGNOSIS — G8929 Other chronic pain: Secondary | ICD-10-CM | POA: Diagnosis not present

## 2020-01-31 NOTE — Therapy (Signed)
[facility] [address] [place], [place], [address] Phone: [phone]   Fax:  [phone]  Physical Therapy Treatment  Patient Details  Name: Angela [name] MRN: [id number] Date of Birth: [birth year] Referring Provider (PT): Fuller Canada MD    Encounter Date: 01/31/2020  PT End of Session - 01/31/20 1053    Visit Number  4    Number of Visits  18    Date for PT Re-Evaluation  02/17/20    Authorization Type  [employer]    Authorization Time Period  4/12-21-02/17/20    Authorization - Visit Number  4    Authorization - Number of Visits  12    Progress Note Due on Visit  10    PT Start Time  1014    PT Stop Time  1045    PT Time Calculation (min)  31 min    Equipment Utilized During Treatment  Gait belt    Activity Tolerance  Patient limited by fatigue;Patient tolerated treatment well    Behavior During Therapy  [facility] for tasks assessed/performed       Past Medical History:  Diagnosis Date  . Clostridium difficile infection 2010  . Hip fx (HCC)   . Use of cane as ambulatory aid     Past Surgical History:  Procedure Laterality Date  . INTRAMEDULLARY (IM) NAIL INTERTROCHANTERIC Right 12/21/2012   Procedure: INTRAMEDULLARY (IM) NAIL INTERTROCHANTRIC;  Surgeon: Vickki Hearing, MD;  Location: [facility];  Service: Orthopedics;  Laterality: Right;  . SKIN BIOPSY     3x 2013  . TONSILLECTOMY      There were no vitals filed for this visit.  Subjective Assessment - 01/31/20 1026    Subjective  Pt showed 12 minutes late for appt.  States she was running late.  States her pain is 7/10 today in her "upper leg" pointing to groin region bilaterally.    Currently in Pain?  Yes    Pain Score  7     Pain Location  Groin    Pain Orientation  Right;Left    Pain Descriptors / Indicators  Aching                       [facility] Adult PT Treatment/Exercise - 01/31/20 0001      Knee/Hip Exercises: Stretches   Hip  Flexor Stretch  Right;30 seconds    Hip Flexor Stretch Limitations  supine off EOB      Knee/Hip Exercises: Seated   Long Arc Quad  Both;15 reps      Knee/Hip Exercises: Supine   Bridges  15 reps    Other Supine Knee/Hip Exercises  clam 15 reps each      Manual Therapy   Manual Therapy  Soft tissue mobilization;Passive ROM    Manual therapy comments  completed separate rest of treatment    Soft tissue mobilization  supine to Rt hip flexor, TFL, rectus mm.                 PT Short Term Goals - 01/17/20 0916      PT SHORT TERM GOAL #1   Title  Patient will be independent with initial HEP to improve functional outcomes    Time  3    Period  Weeks    Status  New    Target Date  02/10/20      PT SHORT TERM GOAL #2   Title  Patient will be able  to ambulate at least 150 feet during with LRAD to demonstrate improved ability to perform functional mobility and associated tasks.    Time  3    Period  Weeks    Status  New    Target Date  02/10/20        PT Long Term Goals - 01/17/20 0917      PT LONG TERM GOAL #1   Title  Patient will demonstrate an improvement on FOTO of 10% as evidence of patient's perceived improvement in functional mobility.     Time  6    Period  Weeks    Status  New    Target Date  03/02/20      PT LONG TERM GOAL #2   Title  Patient will be able to ambulate at least 250 feet during with LRAD to demonstrate improved ability to perform functional mobility and associated tasks.    Time  6    Period  Weeks    Status  New    Target Date  03/02/20      PT LONG TERM GOAL #3   Title  Patient will have equal to or > 4/5 MMT throughout BLE to improve ability to perform functional mobility, stair ambulation and ADLs.    Time  6    Period  Weeks    Status  New    Target Date  03/02/20      PT LONG TERM GOAL #4   Title  Patient will be able to perform stand x 5 in < 15 seconds to demonstrate improvement in functional mobility and reduced  risk for falls.    Time  6    Period  Weeks    Status  New    Target Date  03/02/20            Plan - 01/31/20 1054    Clinical Impression Statement  pt was late for appt today, therefore unable to complete full session.  Pt with generalized pain in groin region, Rt>Lt.  Noted tightness in this area.  Completed manual to hip flexor, TFL and surrounding musculature all with gross tightness. Added supine hip flexor stretch with effectiveness noted.  Pt with reduced symptoms and improved ambualtion at end of session.    Personal Factors and Comorbidities  Age;Time since onset of injury/illness/exacerbation    Examination-Activity Limitations  Bathing;Squat;Bend;Bed Mobility;Stairs;Stand;Transfers;Dressing;Hygiene/Grooming;Lift;Locomotion Level    Examination-Participation Restrictions  Community Activity;Volunteer;Laundry;Yard Work    Stability/Clinical Decision Making  Stable/Uncomplicated    Rehab Potential  Fair    PT Frequency  3x / week    PT Duration  6 weeks    PT Treatment/Interventions  ADLs/Self Care Home Management;Aquatic Therapy;Biofeedback;Cryotherapy;Electrical Stimulation;Iontophoresis 4mg /ml Dexamethasone;Moist Heat;Traction;Balance training;Manual techniques;Therapeutic exercise;Vestibular;Splinting;Taping;Vasopneumatic Device;Functional mobility training;Therapeutic activities;Orthotic Fit/Training;Gait training;Stair training;DME Instruction;Patient/family education;Energy conservation;Dry needling;Joint Manipulations;Spinal Manipulations;Passive range of motion;Ultrasound;Parrafin;Fluidtherapy;Contrast Bath;Neuromuscular re-education;Compression bandaging    PT Next Visit Plan  progress as tolerated; encourage movement and improved ambulation.  Update HEP to include hip flexor stretch.  Also show in standing.    PT Home Exercise Plan  4/20: hamstring stretch, prone hip extension, sidelying hip abduction, bridge, SLR    Consulted and Agree with Plan of Care  Patient        Patient will benefit from skilled therapeutic intervention in order to improve the following deficits and impairments:  Abnormal gait, Decreased endurance, Hypomobility, Decreased activity tolerance, Decreased strength, Pain, Decreased balance, Decreased mobility, Difficulty walking, Decreased range of motion, Postural dysfunction, Improper body mechanics  Visit Diagnosis: Pain in right hip  Chronic low back pain, unspecified back pain laterality, unspecified whether sciatica present  Other abnormalities of gait and mobility     Problem List Patient Active Problem List   Diagnosis Date Noted  . S/P ORIF (open reduction internal fixation) fracture rIght hip IM Nail 12/21/12 12/14/2017  . Effusion of knee joint 10/05/2014  . Acute blood loss anemia 12/22/2012  . Closed hip fracture ([place]) 12/21/2012  . Intertrochanteric fracture of right hip ([place]) 12/21/2012   Teena Irani, PTA/CLT [phone]  Teena Irani 01/31/2020, 10:56 AM  [facility] [address] [place], [place], [address] Phone: [phone]   Fax:  [phone]  Name: Angela [name] MRN: [id number] Date of Birth: 03/12/1941

## 2020-02-02 ENCOUNTER — Other Ambulatory Visit: Payer: Self-pay

## 2020-02-02 ENCOUNTER — Ambulatory Visit (HOSPITAL_COMMUNITY): Payer: Medicare PPO | Admitting: Physical Therapy

## 2020-02-02 DIAGNOSIS — G8929 Other chronic pain: Secondary | ICD-10-CM

## 2020-02-02 DIAGNOSIS — R2689 Other abnormalities of gait and mobility: Secondary | ICD-10-CM

## 2020-02-02 DIAGNOSIS — M25551 Pain in right hip: Secondary | ICD-10-CM | POA: Diagnosis not present

## 2020-02-02 DIAGNOSIS — M545 Low back pain, unspecified: Secondary | ICD-10-CM

## 2020-02-02 NOTE — Therapy (Signed)
[place], [place], [address] Phone: [phone]   Fax:  [phone]  Physical Therapy Treatment  Patient Details  Name: Angela [name] MRN: [id number] Date of Birth: 1941-09-27 Referring Provider (PT): Arther Abbott MD    Encounter Date: 02/02/2020  PT End of Session - 02/02/20 1159    Visit Number  5    Number of Visits  18    Date for PT Re-Evaluation  02/17/20    Authorization Type  [employer]    Authorization Time Period  4/12-21-02/17/20    Authorization - Visit Number  5    Authorization - Number of Visits  12    Progress Note Due on Visit  10    PT Start Time  1008    PT Stop Time  1049    PT Time Calculation (min)  41 min    Equipment Utilized During Treatment  Gait belt    Activity Tolerance  Patient limited by fatigue;Patient tolerated treatment well    Behavior During Therapy  [facility] for tasks assessed/performed       Past Medical History:  Diagnosis Date  . Clostridium difficile infection 2010  . Hip fx ([place])   . Use of cane as ambulatory aid     Past Surgical History:  Procedure Laterality Date  . INTRAMEDULLARY (IM) NAIL INTERTROCHANTERIC Right 12/21/2012   Procedure: INTRAMEDULLARY (IM) NAIL INTERTROCHANTRIC;  Surgeon: Carole Civil, MD;  Location: [facility];  Service: Orthopedics;  Laterality: Right;  . SKIN BIOPSY     3x 2013  . TONSILLECTOMY      There were no vitals filed for this visit.  Subjective Assessment - 02/02/20 1013    Subjective  pt states her pain varies between 4-5 depending on what she is doine.  STates her son has been using the tennis ball on her thigh and hip.    Currently in Pain?  Yes    Pain Score  5     Pain Location  Hip    Pain Orientation  Right    Pain Descriptors / Indicators  Aching;Tightness    Pain Type  Chronic pain                       [facility] Adult PT Treatment/Exercise - 02/02/20 0001      Knee/Hip Exercises:  Stretches   Active Hamstring Stretch  1 rep;60 seconds    Active Hamstring Stretch Limitations  long sitting      Knee/Hip Exercises: Standing   Other Standing Knee Exercises  hip excursions 5 reps each with therapist assist to increase movement      Knee/Hip Exercises: Seated   Long Arc Quad  Both;15 reps      Knee/Hip Exercises: Supine   Bridges  15 reps    Straight Leg Raises  Both;15 reps      Manual Therapy   Manual Therapy  Soft tissue mobilization    Manual therapy comments  completed separate rest of treatment    Soft tissue mobilization  supine to Rt hip flexor, TFL, rectus mm.     self instruction            PT Education - 02/02/20 1158    Education Details  updated HEP to include hip excursions.  Encouraged increasing mobility and stretches as patient is very stiff.  Explained how stiffness contributes to main and dysfunction    Person(s) Educated  Patient  Methods  Explanation    Comprehension  Verbalized understanding       PT Short Term Goals - 01/17/20 0916      PT SHORT TERM GOAL #1   Title  Patient will be independent with initial HEP to improve functional outcomes    Time  3    Period  Weeks    Status  New    Target Date  02/10/20      PT SHORT TERM GOAL #2   Title  Patient will be able to ambulate at least 150 feet during with LRAD to demonstrate improved ability to perform functional mobility and associated tasks.    Time  3    Period  Weeks    Status  New    Target Date  02/10/20        PT Long Term Goals - 01/17/20 0917      PT LONG TERM GOAL #1   Title  Patient will demonstrate an improvement on FOTO of 10% as evidence of patient's perceived improvement in functional mobility.     Time  6    Period  Weeks    Status  New    Target Date  03/02/20      PT LONG TERM GOAL #2   Title  Patient will be able to ambulate at least 250 feet during with LRAD to demonstrate improved ability to perform functional mobility and  associated tasks.    Time  6    Period  Weeks    Status  New    Target Date  03/02/20      PT LONG TERM GOAL #3   Title  Patient will have equal to or > 4/5 MMT throughout BLE to improve ability to perform functional mobility, stair ambulation and ADLs.    Time  6    Period  Weeks    Status  New    Target Date  03/02/20      PT LONG TERM GOAL #4   Title  Patient will be able to perform stand x 5 in < 15 seconds to demonstrate improvement in functional mobility and reduced risk for falls.    Time  6    Period  Weeks    Status  New    Target Date  03/02/20            Plan - 02/02/20 1217    Clinical Impression Statement  Continued with focus on increasing stride with ambulation and increasing general mobiltiy as patient remains very restricted in all motions.  Added hip excursions with manual cues from therapist to improve mobility.  Pt able to complete SLR to tday without pain or disfunctoin.  Edcuated on self massage using ball to RT hip /TFL region.  Updated HEP to include hip excursions.    Personal Factors and Comorbidities  Age;Time since onset of injury/illness/exacerbation    Examination-Activity Limitations  Bathing;Squat;Bend;Bed Mobility;Stairs;Stand;Transfers;Dressing;Hygiene/Grooming;Lift;Locomotion Level    Examination-Participation Restrictions  Community Activity;Volunteer;Laundry;Yard Work    Stability/Clinical Decision Making  Stable/Uncomplicated    Rehab Potential  Fair    PT Frequency  3x / week    PT Duration  6 weeks    PT Treatment/Interventions  ADLs/Self Care Home Management;Aquatic Therapy;Biofeedback;Cryotherapy;Electrical Stimulation;Iontophoresis 4mg /ml Dexamethasone;Moist Heat;Traction;Balance training;Manual techniques;Therapeutic exercise;Vestibular;Splinting;Taping;Vasopneumatic Device;Functional mobility training;Therapeutic activities;Orthotic Fit/Training;Gait training;Stair training;DME Instruction;Patient/family education;Energy  conservation;Dry needling;Joint Manipulations;Spinal Manipulations;Passive range of motion;Ultrasound;Parrafin;Fluidtherapy;Contrast Bath;Neuromuscular re-education;Compression bandaging    PT Next Visit Plan  progress as tolerated; encourage movement and improved ambulation.  Show hip flexor stretch in standing.    PT Home Exercise Plan  4/20: hamstring stretch, prone hip extension, sidelying hip abduction, bridge, SLR  4/29: hip excursions    Consulted and Agree with Plan of Care  Patient       Patient will benefit from skilled therapeutic intervention in order to improve the following deficits and impairments:  Abnormal gait, Decreased endurance, Hypomobility, Decreased activity tolerance, Decreased strength, Pain, Decreased balance, Decreased mobility, Difficulty walking, Decreased range of motion, Postural dysfunction, Improper body mechanics  Visit Diagnosis: Pain in right hip  Other abnormalities of gait and mobility  Chronic low back pain, unspecified back pain laterality, unspecified whether sciatica present     Problem List Patient Active Problem List   Diagnosis Date Noted  . S/P ORIF (open reduction internal fixation) fracture rIght hip IM Nail 12/21/12 12/14/2017  . Effusion of knee joint 10/05/2014  . Acute blood loss anemia 12/22/2012  . Closed hip fracture (HCC) 12/21/2012  . Intertrochanteric fracture of right hip (HCC) 12/21/2012   Lurena Nida, PTA/CLT [phone]  Lurena Nida 02/02/2020, 12:21 PM  [facility] [address] [place], [place], [address] Phone: [phone]   Fax:  [phone]  Name: Angela [name] MRN: [id number] Date of Birth: 05-27-1941

## 2020-02-07 ENCOUNTER — Other Ambulatory Visit: Payer: Self-pay

## 2020-02-07 ENCOUNTER — Other Ambulatory Visit (HOSPITAL_COMMUNITY): Payer: Self-pay | Admitting: Internal Medicine

## 2020-02-07 ENCOUNTER — Ambulatory Visit (HOSPITAL_COMMUNITY): Payer: Medicare PPO | Attending: Orthopedic Surgery | Admitting: Physical Therapy

## 2020-02-07 DIAGNOSIS — M545 Low back pain, unspecified: Secondary | ICD-10-CM

## 2020-02-07 DIAGNOSIS — R2689 Other abnormalities of gait and mobility: Secondary | ICD-10-CM | POA: Diagnosis not present

## 2020-02-07 DIAGNOSIS — M25551 Pain in right hip: Secondary | ICD-10-CM | POA: Insufficient documentation

## 2020-02-07 DIAGNOSIS — G8929 Other chronic pain: Secondary | ICD-10-CM | POA: Diagnosis not present

## 2020-02-07 DIAGNOSIS — Z1231 Encounter for screening mammogram for malignant neoplasm of breast: Secondary | ICD-10-CM

## 2020-02-07 NOTE — Therapy (Signed)
[facility] [address] [place], [place], [address] Phone: [phone]   Fax:  [phone]  Physical Therapy Treatment  Patient Details  Name: Angela [name] MRN: [id number] Date of Birth: 1941-06-16 Referring Provider (PT): Fuller Canada MD    Encounter Date: 02/07/2020  PT End of Session - 02/07/20 1225    Visit Number  6    Number of Visits  18    Date for PT Re-Evaluation  02/17/20    Authorization Type  [employer]    Authorization Time Period  4/12-21-02/17/20    Authorization - Visit Number  6    Authorization - Number of Visits  12    Progress Note Due on Visit  10    PT Start Time  1052    PT Stop Time  1142    PT Time Calculation (min)  50 min    Equipment Utilized During Treatment  Gait belt    Activity Tolerance  Patient limited by fatigue;Patient tolerated treatment well    Behavior During Therapy  [facility] for tasks assessed/performed       Past Medical History:  Diagnosis Date  . Clostridium difficile infection 2010  . Hip fx ([facility])   . Use of cane as ambulatory aid     Past Surgical History:  Procedure Laterality Date  . INTRAMEDULLARY (IM) NAIL INTERTROCHANTERIC Right 12/21/2012   Procedure: INTRAMEDULLARY (IM) NAIL INTERTROCHANTRIC;  Surgeon: Vickki Hearing, MD;  Location: [facility];  Service: Orthopedics;  Laterality: Right;  . SKIN BIOPSY     3x 2013  . TONSILLECTOMY      There were no vitals filed for this visit.  Subjective Assessment - 02/07/20 1055    Subjective  Pt states it is slowly getting better.  Currently 5/10.  States her son has been using a tennis ball to roll out her tightness in her thigh and hip.    Currently in Pain?  Yes    Pain Score  5     Pain Location  Hip    Pain Orientation  Right    Pain Descriptors / Indicators  Aching;Tightness    Pain Type  Chronic pain                       [facility] Adult PT Treatment/Exercise - 02/07/20 1223      Knee/Hip  Exercises: Stretches   Hip Flexor Stretch  Right;30 seconds    Hip Flexor Stretch Limitations  standing instruction on 7" step      Knee/Hip Exercises: Aerobic   Nustep  at EOS 5 minutes UE/LE      Knee/Hip Exercises: Standing   Other Standing Knee Exercises  hip excursions 5 reps each with therapist assist to increase movement      Knee/Hip Exercises: Supine   Bridges  20 reps    Straight Leg Raises  Both;15 reps      Manual Therapy   Manual Therapy  Soft tissue mobilization    Manual therapy comments  completed separate rest of treatment    Soft tissue mobilization  supine to Rt hip flexor, TFL, rectus mm.                 PT Short Term Goals - 01/17/20 0916      PT SHORT TERM GOAL #1   Title  Patient will be independent with initial HEP to improve functional outcomes    Time  3  Period  Weeks    Status  New    Target Date  02/10/20      PT SHORT TERM GOAL #2   Title  Patient will be able to ambulate at least 150 feet during with LRAD to demonstrate improved ability to perform functional mobility and associated tasks.    Time  3    Period  Weeks    Status  New    Target Date  02/10/20        PT Long Term Goals - 01/17/20 0917      PT LONG TERM GOAL #1   Title  Patient will demonstrate an improvement on FOTO of 10% as evidence of patient's perceived improvement in functional mobility.     Time  6    Period  Weeks    Status  New    Target Date  03/02/20      PT LONG TERM GOAL #2   Title  Patient will be able to ambulate at least 250 feet during with LRAD to demonstrate improved ability to perform functional mobility and associated tasks.    Time  6    Period  Weeks    Status  New    Target Date  03/02/20      PT LONG TERM GOAL #3   Title  Patient will have equal to or > 4/5 MMT throughout BLE to improve ability to perform functional mobility, stair ambulation and ADLs.    Time  6    Period  Weeks    Status  New    Target Date  03/02/20       PT LONG TERM GOAL #4   Title  Patient will be able to perform stand x 5 in < 15 seconds to demonstrate improvement in functional mobility and reduced risk for falls.    Time  6    Period  Weeks    Status  New    Target Date  03/02/20            Plan - 02/07/20 1226    Clinical Impression Statement  Pt reporting improvements in her symptoms overall.  Continued with stretching and strengthening.  Instructed with standing hip flexor stretch this session with good form overall.  Improved moiblity completing excursions this session with less manual and verbal cues.  manual continued with less tightness noted in TFL and hip region.  Finished session with nustep to further improve mobility.    Personal Factors and Comorbidities  Age;Time since onset of injury/illness/exacerbation    Examination-Activity Limitations  Bathing;Squat;Bend;Bed Mobility;Stairs;Stand;Transfers;Dressing;Hygiene/Grooming;Lift;Locomotion Level    Examination-Participation Restrictions  Community Activity;Volunteer;Laundry;Yard Work    Stability/Clinical Decision Making  Stable/Uncomplicated    Rehab Potential  Fair    PT Frequency  3x / week    PT Duration  6 weeks    PT Treatment/Interventions  ADLs/Self Care Home Management;Aquatic Therapy;Biofeedback;Cryotherapy;Electrical Stimulation;Iontophoresis 4mg /ml Dexamethasone;Moist Heat;Traction;Balance training;Manual techniques;Therapeutic exercise;Vestibular;Splinting;Taping;Vasopneumatic Device;Functional mobility training;Therapeutic activities;Orthotic Fit/Training;Gait training;Stair training;DME Instruction;Patient/family education;Energy conservation;Dry needling;Joint Manipulations;Spinal Manipulations;Passive range of motion;Ultrasound;Parrafin;Fluidtherapy;Contrast Bath;Neuromuscular re-education;Compression bandaging    PT Next Visit Plan  progress as tolerated; encourage movement and improved ambulation.    PT Home Exercise Plan  4/20: hamstring stretch,  prone hip extension, sidelying hip abduction, bridge, SLR  4/29: hip excursions    Consulted and Agree with Plan of Care  Patient       Patient will benefit from skilled therapeutic intervention in order to improve the following deficits and impairments:  Abnormal gait,  Decreased endurance, Hypomobility, Decreased activity tolerance, Decreased strength, Pain, Decreased balance, Decreased mobility, Difficulty walking, Decreased range of motion, Postural dysfunction, Improper body mechanics  Visit Diagnosis: Pain in right hip  Other abnormalities of gait and mobility  Chronic low back pain, unspecified back pain laterality, unspecified whether sciatica present     Problem List Patient Active Problem List   Diagnosis Date Noted  . S/P ORIF (open reduction internal fixation) fracture rIght hip IM Nail 12/21/12 12/14/2017  . Effusion of knee joint 10/05/2014  . Acute blood loss anemia 12/22/2012  . Closed hip fracture ([place]) 12/21/2012  . Intertrochanteric fracture of right hip ([place]) 12/21/2012   Teena Irani, PTA/CLT [phone]  Teena Irani 02/07/2020, 12:28 PM  [place] [address] [place], [place], [address] Phone: [phone]   Fax:  [phone]  Name: KRISLYNN GRONAU MRN: [id number] Date of Birth: 1941/09/07

## 2020-02-08 ENCOUNTER — Ambulatory Visit (INDEPENDENT_AMBULATORY_CARE_PROVIDER_SITE_OTHER): Payer: Medicare PPO | Admitting: Orthopedic Surgery

## 2020-02-08 ENCOUNTER — Encounter: Payer: Self-pay | Admitting: Orthopedic Surgery

## 2020-02-08 ENCOUNTER — Ambulatory Visit: Payer: Medicare PPO

## 2020-02-08 VITALS — BP 136/73 | HR 73 | Ht 59.0 in | Wt 113.0 lb

## 2020-02-08 DIAGNOSIS — M5441 Lumbago with sciatica, right side: Secondary | ICD-10-CM | POA: Diagnosis not present

## 2020-02-08 NOTE — Progress Notes (Signed)
Chief Complaint  Patient presents with  . Back Pain   Angela [name] was seen last time for pain in her lower back we put her on meloxicam gabapentin and prednisone but the meloxicam and gabapentin bothered her stomach.  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  Recommend continue physical therapy follow-up in 4 weeks  Encounter Diagnosis  Name Primary?  . Right-sided low back pain with right-sided sciatica, unspecified chronicity Yes

## 2020-02-08 NOTE — Patient Instructions (Signed)
Continue therapy

## 2020-02-09 ENCOUNTER — Encounter (HOSPITAL_COMMUNITY): Payer: Self-pay | Admitting: Physical Therapy

## 2020-02-09 ENCOUNTER — Ambulatory Visit (HOSPITAL_COMMUNITY): Payer: Medicare PPO | Admitting: Physical Therapy

## 2020-02-09 ENCOUNTER — Other Ambulatory Visit: Payer: Self-pay

## 2020-02-09 DIAGNOSIS — M25551 Pain in right hip: Secondary | ICD-10-CM | POA: Diagnosis not present

## 2020-02-09 DIAGNOSIS — R2689 Other abnormalities of gait and mobility: Secondary | ICD-10-CM | POA: Diagnosis not present

## 2020-02-09 DIAGNOSIS — M545 Low back pain, unspecified: Secondary | ICD-10-CM

## 2020-02-09 DIAGNOSIS — G8929 Other chronic pain: Secondary | ICD-10-CM

## 2020-02-09 NOTE — Therapy (Signed)
[place], [place], [address] Phone: [phone]   Fax:  [phone]  Physical Therapy Treatment  Patient Details  Name: Angela [name] MRN: [id number] Date of Birth: 10/30/1940 Referring Provider (PT): Arther Abbott MD    Encounter Date: 02/09/2020  PT End of Session - 02/09/20 1002    Visit Number  7    Number of Visits  18    Date for PT Re-Evaluation  02/17/20    Authorization Type  [employer]    Authorization Time Period  4/12-21-02/17/20    Authorization - Visit Number  7    Authorization - Number of Visits  12    Progress Note Due on Visit  10    PT Start Time  0867    PT Stop Time  1042    PT Time Calculation (min)  40 min    Equipment Utilized During Treatment  Gait belt    Activity Tolerance  Patient limited by fatigue;Patient tolerated treatment well    Behavior During Therapy  [facility] for tasks assessed/performed       Past Medical History:  Diagnosis Date  . Clostridium difficile infection 2010  . Hip fx ([place])   . Use of cane as ambulatory aid     Past Surgical History:  Procedure Laterality Date  . INTRAMEDULLARY (IM) NAIL INTERTROCHANTERIC Right 12/21/2012   Procedure: INTRAMEDULLARY (IM) NAIL INTERTROCHANTRIC;  Surgeon: Carole Civil, MD;  Location: [facility];  Service: Orthopedics;  Laterality: Right;  . SKIN BIOPSY     3x 2013  . TONSILLECTOMY      There were no vitals filed for this visit.  Subjective Assessment - 02/09/20 1004    Subjective  States she is not having any current pain but she feels the exercises are helping. States she does have a little pain in her low back when walking. States she had her xrays yesterday and there was no change.    Currently in Pain?  No/denies         [facility] PT Assessment - 02/09/20 0001      Assessment   Medical Diagnosis  LBP    Referring Provider (PT)  Arther Abbott MD                    [facility] Adult PT  Treatment/Exercise - 02/09/20 0001      Knee/Hip Exercises: Aerobic   Nustep  at EOS 5 minutes UE/LE      Knee/Hip Exercises: Standing   Hip Extension  AROM;3 sets;5 reps;Knee straight    Other Standing Knee Exercises  lateral stepping at counter- needed freq verbal cues and hand over hand assist as patient confused as to what she was doing mid exercise.  - 10 minutes of practice       Knee/Hip Exercises: Seated   Other Seated Knee/Hip Exercises  assisted pririformis stretch with towel - Pt assist - B x15 5" holds L unable to perform on R side because of pain     Sit to Sand  15 reps;without UE support   2x sets              PT Short Term Goals - 01/17/20 0916      PT SHORT TERM GOAL #1   Title  Patient will be independent with initial HEP to improve functional outcomes    Time  3    Period  Weeks    Status  New  Target Date  02/10/20      PT SHORT TERM GOAL #2   Title  Patient will be able to ambulate at least 150 feet during with LRAD to demonstrate improved ability to perform functional mobility and associated tasks.    Time  3    Period  Weeks    Status  New    Target Date  02/10/20        PT Long Term Goals - 01/17/20 0917      PT LONG TERM GOAL #1   Title  Patient will demonstrate an improvement on FOTO of 10% as evidence of patient's perceived improvement in functional mobility.     Time  6    Period  Weeks    Status  New    Target Date  03/02/20      PT LONG TERM GOAL #2   Title  Patient will be able to ambulate at least 250 feet during with LRAD to demonstrate improved ability to perform functional mobility and associated tasks.    Time  6    Period  Weeks    Status  New    Target Date  03/02/20      PT LONG TERM GOAL #3   Title  Patient will have equal to or > 4/5 MMT throughout BLE to improve ability to perform functional mobility, stair ambulation and ADLs.    Time  6    Period  Weeks    Status  New    Target Date  03/02/20       PT LONG TERM GOAL #4   Title  Patient will be able to perform stand x 5 in < 15 seconds to demonstrate improvement in functional mobility and reduced risk for falls.    Time  6    Period  Weeks    Status  New    Target Date  03/02/20            Plan - 02/09/20 1045    Clinical Impression Statement  Patient initially had difficulty with new movements and exercises. Required hand over hand assist to get side stepping and hip extension down. After a rest break and practice she was able to perform these with just verbal cues. Patient did not like bridges or laying down exercises on HEP but explained importance of them in regards to rehab.    Personal Factors and Comorbidities  Age;Time since onset of injury/illness/exacerbation    Examination-Activity Limitations  Bathing;Squat;Bend;Bed Mobility;Stairs;Stand;Transfers;Dressing;Hygiene/Grooming;Lift;Locomotion Level    Examination-Participation Restrictions  Community Activity;Volunteer;Laundry;Yard Work    Stability/Clinical Decision Making  Stable/Uncomplicated    Rehab Potential  Fair    PT Frequency  3x / week    PT Duration  6 weeks    PT Treatment/Interventions  ADLs/Self Care Home Management;Aquatic Therapy;Biofeedback;Cryotherapy;Electrical Stimulation;Iontophoresis 4mg /ml Dexamethasone;Moist Heat;Traction;Balance training;Manual techniques;Therapeutic exercise;Vestibular;Splinting;Taping;Vasopneumatic Device;Functional mobility training;Therapeutic activities;Orthotic Fit/Training;Gait training;Stair training;DME Instruction;Patient/family education;Energy conservation;Dry needling;Joint Manipulations;Spinal Manipulations;Passive range of motion;Ultrasound;Parrafin;Fluidtherapy;Contrast Bath;Neuromuscular re-education;Compression bandaging    PT Next Visit Plan  progress as tolerated; encourage movement and improved ambulation.    PT Home Exercise Plan  4/20: hamstring stretch, prone hip extension, sidelying hip abduction, bridge, SLR   4/29: hip excursions; 5/6 lateral stepping    Consulted and Agree with Plan of Care  Patient       Patient will benefit from skilled therapeutic intervention in order to improve the following deficits and impairments:  Abnormal gait, Decreased endurance, Hypomobility, Decreased activity tolerance, Decreased strength, Pain, Decreased balance,  Decreased mobility, Difficulty walking, Decreased range of motion, Postural dysfunction, Improper body mechanics  Visit Diagnosis: Other abnormalities of gait and mobility  Pain in right hip  Chronic low back pain, unspecified back pain laterality, unspecified whether sciatica present     Problem List Patient Active Problem List   Diagnosis Date Noted  . S/P ORIF (open reduction internal fixation) fracture rIght hip IM Nail 12/21/12 12/14/2017  . Effusion of knee joint 10/05/2014  . Acute blood loss anemia 12/22/2012  . Closed hip fracture ([facility]) 12/21/2012  . Intertrochanteric fracture of right hip (HCC) 12/21/2012    10:47 AM, 02/09/20 Tereasa Coop, DPT Physical Therapy with [facility]  [phone] office  [facility] [address] [place], [place], [address] Phone: [phone]   Fax:  [phone]  Name: Angela [name] MRN: [id number] Date of Birth: March 20, 1941

## 2020-02-13 ENCOUNTER — Encounter (HOSPITAL_COMMUNITY): Payer: Self-pay | Admitting: Physical Therapy

## 2020-02-13 ENCOUNTER — Other Ambulatory Visit: Payer: Self-pay

## 2020-02-13 ENCOUNTER — Ambulatory Visit (HOSPITAL_COMMUNITY): Payer: Medicare PPO | Admitting: Physical Therapy

## 2020-02-13 DIAGNOSIS — M545 Low back pain, unspecified: Secondary | ICD-10-CM

## 2020-02-13 DIAGNOSIS — M25551 Pain in right hip: Secondary | ICD-10-CM

## 2020-02-13 DIAGNOSIS — R2689 Other abnormalities of gait and mobility: Secondary | ICD-10-CM

## 2020-02-13 DIAGNOSIS — G8929 Other chronic pain: Secondary | ICD-10-CM | POA: Diagnosis not present

## 2020-02-13 NOTE — Therapy (Signed)
[facility] [address] [place], [place], [address] Phone: [phone]   Fax:  [phone]  Physical Therapy Treatment/ Progress Note  Patient Details  Name: Angela [name] MRN: [id number] Date of Birth: 11/13/1940 Referring Provider (PT): Arther Abbott MD    Encounter Date: 02/13/2020  Progress Note Reporting Period 01/17/20 to 02/13/20  See note below for Objective Data and Assessment of Progress/Goals.       PT End of Session - 02/13/20 1354    Visit Number  8    Number of Visits  18    Date for PT Re-Evaluation  03/16/20    Authorization Type  [employer] (Request 4 more visits, for total of 16, on 02/13/20. Check for auth)    Authorization Time Period  4/12-21-02/17/20    Authorization - Visit Number  8    Authorization - Number of Visits  12    Progress Note Due on Visit  12    PT Start Time  1350    PT Stop Time  1432    PT Time Calculation (min)  42 min    Equipment Utilized During Treatment  Gait belt    Activity Tolerance  Patient tolerated treatment well    Behavior During Therapy  WFL for tasks assessed/performed       Past Medical History:  Diagnosis Date  . Clostridium difficile infection 2010  . Hip fx ([place])   . Use of cane as ambulatory aid     Past Surgical History:  Procedure Laterality Date  . INTRAMEDULLARY (IM) NAIL INTERTROCHANTERIC Right 12/21/2012   Procedure: INTRAMEDULLARY (IM) NAIL INTERTROCHANTRIC;  Surgeon: Carole Civil, MD;  Location: [facility];  Service: Orthopedics;  Laterality: Right;  . SKIN BIOPSY     3x 2013  . TONSILLECTOMY      There were no vitals filed for this visit.  Subjective Assessment - 02/13/20 1353    Subjective  Patient says she is doing better today. Says she was sore over the weekend because her son gave her a message and caused her to be sore. Says she felt pretty good this morning, much less pain than before.    Currently in Pain?  Yes    Pain Score  2      Pain Location  Hip    Pain Orientation  Right    Pain Descriptors / Indicators  Aching    Pain Type  Chronic pain         [facility] PT Assessment - 02/13/20 0001      Assessment   Medical Diagnosis  LBP    Referring Provider (PT)  Arther Abbott MD     Next MD Visit  03/09/20    Prior Therapy  yes      Precautions   Precautions  Fall      Restrictions   Weight Bearing Restrictions  No      Balance Screen   Has the patient fallen in the past 6 months  Yes    How many times?  2    Has the patient had a decrease in activity level because of a fear of falling?   No    Is the patient reluctant to leave their home because of a fear of falling?   No      Home Environment   Living Environment  Private residence      Prior Function   Level of Independence  Independent with basic ADLs  Cognition   Overall Cognitive Status  Within Functional Limits for tasks assessed      Observation/Other Assessments   Focus on Therapeutic Outcomes (FOTO)   43% limited    was 46%      Strength   Right Hip Flexion  4+/5    Right Hip Extension  4-/5    Right Hip ABduction  4-/5   was 3+   Left Hip Flexion  5/5    Left Hip Extension  4-/5    Left Hip ABduction  4/5   was 4-   Right Knee Flexion  4+/5   was 4   Right Knee Extension  5/5    Left Knee Flexion  4+/5   was 4   Left Knee Extension  5/5    Right Ankle Dorsiflexion  5/5    Left Ankle Dorsiflexion  5/5      Transfers   Five time sit to stand comments   12 sec no UEs      Ambulation/Gait   Ambulation/Gait  Yes    Ambulation/Gait Assistance  6: Modified independent (Device/Increase time)    Ambulation Distance (Feet)  240 Feet    Assistive device  None    Gait Pattern  Decreased stance time - right;Decreased step length - right;Decreased step length - left;Decreased stride length;Trunk flexed;Narrow base of support    Ambulation Surface  Level;Indoor    Gait Comments  2MWT                   [facility] Adult  PT Treatment/Exercise - 02/13/20 0001      Knee/Hip Exercises: Aerobic   Nustep  at EOS 4 minutes UE/LE      Manual Therapy   Manual Therapy  Soft tissue mobilization;Passive ROM    Manual therapy comments  completed separate rest of treatment    Soft tissue mobilization  gentle IASTM using tennis ball to RT lateral and posterior hip (glute med, TFL, ITB) patient in SL     Passive ROM  gentle, partial range PROM RT hip flexion and ER to tolerance             PT Education - 02/13/20 1639    Education Details  on reassessment findings, progress toward therapy goals and POC    Person(s) Educated  Patient    Methods  Explanation    Comprehension  Verbalized understanding       PT Short Term Goals - 02/13/20 1432      PT SHORT TERM GOAL #1   Title  Patient will be independent with initial HEP to improve functional outcomes    Time  3    Period  Weeks    Status  Achieved    Target Date  02/10/20      PT SHORT TERM GOAL #2   Title  Patient will be able to ambulate at least 150 feet during 2MWT with LRAD to demonstrate improved ability to perform functional mobility and associated tasks.    Baseline  240 feet with no AD    Time  3    Period  Weeks    Status  Achieved    Target Date  02/10/20        PT Long Term Goals - 02/13/20 1433      PT LONG TERM GOAL #1   Title  Patient will demonstrate an improvement on FOTO of 10% as evidence of patient's perceived improvement in functional mobility.     Baseline  current: 43% limitation    Time  6    Period  Weeks    Status  On-going      PT LONG TERM GOAL #2   Title  Patient will be able to ambulate at least 250 feet during 2MWT with LRAD to demonstrate improved ability to perform functional mobility and associated tasks.    Baseline  240 feet with no AD    Time  6    Period  Weeks    Status  On-going      PT LONG TERM GOAL #3   Title  Patient will have equal to or > 4/5 MMT throughout BLE to improve ability to perform  functional mobility, stair ambulation and ADLs.    Baseline  see MMT    Time  6    Period  Weeks    Status  On-going      PT LONG TERM GOAL #4   Title  Patient will be able to perform stand x 5 in < 15 seconds to demonstrate improvement in functional mobility and reduced risk for falls.    Baseline  12 sec with no UE    Time  6    Period  Weeks    Status  Achieved            Plan - 02/13/20 1639    Clinical Impression Statement  Patient demos good progress toward therapy goals. Patient currently with 2/2 short term and  long term goals met. Patient shows improvement in gait and reports overall reduction in subjective complaint of pain. Patient continues to be limited by balance deficits, Rom and muscle restrictions, and mild weakness which continue to negatively impact functional ability. Patient will continue to benefit from skilled therapy services to address remaining deficits to reduce pain and improve LOF with ADLs and functional mobility tasks.    Personal Factors and Comorbidities  Age;Time since onset of injury/illness/exacerbation    Examination-Activity Limitations  Bathing;Squat;Bend;Bed Mobility;Stairs;Stand;Transfers;Dressing;Hygiene/Grooming;Lift;Locomotion Level    Examination-Participation Restrictions  Community Activity;Volunteer;Laundry;Yard Work    Stability/Clinical Decision Making  Stable/Uncomplicated    Rehab Potential  Fair    PT Frequency  2x / week    PT Duration  4 weeks    PT Treatment/Interventions  ADLs/Self Care Home Management;Aquatic Therapy;Biofeedback;Cryotherapy;Electrical Stimulation;Iontophoresis 52m/ml Dexamethasone;Moist Heat;Traction;Balance training;Manual techniques;Therapeutic exercise;Vestibular;Splinting;Taping;Vasopneumatic Device;Functional mobility training;Therapeutic activities;Orthotic Fit/Training;Gait training;Stair training;DME Instruction;Patient/family education;Energy conservation;Dry needling;Joint Manipulations;Spinal  Manipulations;Passive range of motion;Ultrasound;Parrafin;Fluidtherapy;Contrast Bath;Neuromuscular re-education;Compression bandaging    PT Next Visit Plan  progress as tolerated; encourage movement and improved ambulation.    PT Home Exercise Plan  4/20: hamstring stretch, prone hip extension, sidelying hip abduction, bridge, SLR  4/29: hip excursions; 5/6 lateral stepping    Consulted and Agree with Plan of Care  Patient       Patient will benefit from skilled therapeutic intervention in order to improve the following deficits and impairments:  Abnormal gait, Decreased endurance, Hypomobility, Decreased activity tolerance, Decreased strength, Pain, Decreased balance, Decreased mobility, Difficulty walking, Decreased range of motion, Postural dysfunction, Improper body mechanics  Visit Diagnosis: Other abnormalities of gait and mobility  Pain in right hip  Chronic low back pain, unspecified back pain laterality, unspecified whether sciatica present     Problem List Patient Active Problem List   Diagnosis Date Noted  . S/P ORIF (open reduction internal fixation) fracture rIght hip IM Nail 12/21/12 12/14/2017  . Effusion of knee joint 10/05/2014  . Acute blood loss anemia 12/22/2012  . Closed hip fracture ([facility] 12/21/2012  .  Intertrochanteric fracture of right hip ([place]) 12/21/2012    4:47 PM, 02/13/20 Josue Hector PT DPT  Physical Therapist with [facility]  [phone] [place] [address] [place], [place], [address] Phone: [phone]   Fax:  [phone]  Name: TYREKA HENNEKE MRN: [id number] Date of Birth: 1941/09/13

## 2020-02-15 ENCOUNTER — Other Ambulatory Visit: Payer: Self-pay

## 2020-02-15 ENCOUNTER — Ambulatory Visit (HOSPITAL_COMMUNITY): Payer: Medicare PPO | Admitting: Physical Therapy

## 2020-02-15 ENCOUNTER — Encounter (HOSPITAL_COMMUNITY): Payer: Self-pay | Admitting: Physical Therapy

## 2020-02-15 DIAGNOSIS — R2689 Other abnormalities of gait and mobility: Secondary | ICD-10-CM | POA: Diagnosis not present

## 2020-02-15 DIAGNOSIS — G8929 Other chronic pain: Secondary | ICD-10-CM | POA: Diagnosis not present

## 2020-02-15 DIAGNOSIS — M545 Low back pain, unspecified: Secondary | ICD-10-CM

## 2020-02-15 DIAGNOSIS — M25551 Pain in right hip: Secondary | ICD-10-CM | POA: Diagnosis not present

## 2020-02-15 NOTE — Patient Instructions (Signed)
Access Code: [email or web address] URL: [email or web address] Date: 02/15/2020 Prepared by: Georges Lynch  Exercises Supine Heel Slide - 1 x daily - 7 x weekly - 2 sets - 10 reps Supine Bridge - 1 x daily - 7 x weekly - 2 sets - 10 reps - 5 hold Bent Knee Fallouts - 1 x daily - 7 x weekly - 2 sets - 10 reps Supine Active Straight Leg Raise - 1 x daily - 7 x weekly - 2 sets - 10 reps

## 2020-02-15 NOTE — Therapy (Signed)
[facility] [place], [place], [address] Phone: [phone]   Fax:  [phone]  Physical Therapy Treatment  Patient Details  Name: Angela [name] MRN: [id number] Date of Birth: [birth year] Referring Provider (PT): Arther Abbott MD    Encounter Date: 02/15/2020  PT End of Session - 02/15/20 0956    Visit Number  9    Number of Visits  18    Date for PT Re-Evaluation  03/16/20    Authorization Type  [employer] (Request 7 more visits, for total of 16, on 02/13/20. Check for auth)    Authorization Time Period  4/12-21-02/17/20    Authorization - Visit Number  8    Authorization - Number of Visits  16    Progress Note Due on Visit  16    PT Start Time  0955    PT Stop Time  1045    PT Time Calculation (min)  50 min    Equipment Utilized During Treatment  Gait belt    Activity Tolerance  Patient tolerated treatment well    Behavior During Therapy  WFL for tasks assessed/performed       Past Medical History:  Diagnosis Date  . Clostridium difficile infection 2010  . Hip fx ([place])   . Use of cane as ambulatory aid     Past Surgical History:  Procedure Laterality Date  . INTRAMEDULLARY (IM) NAIL INTERTROCHANTERIC Right 12/21/2012   Procedure: INTRAMEDULLARY (IM) NAIL INTERTROCHANTRIC;  Surgeon: Carole Civil, MD;  Location: [facility];  Service: Orthopedics;  Laterality: Right;  . SKIN BIOPSY     3x 2013  . TONSILLECTOMY      There were no vitals filed for this visit.  Subjective Assessment - 02/15/20 0958    Subjective  Patient says her back is fine. Says her hip feels ok today. Says everyone in a while the joint feels like it wants to slip while she is walking. Reports no pain currently. Says she "can feel it" first thing in the morning, but once she gets moving gets better.    Limitations  Lifting;Standing;Walking;House hold activities    How long can you stand comfortably?  20 minutes    How long can you  walk comfortably?  20 minutes    Diagnostic tests  xrays    Patient Stated Goals  "I don't know"    Currently in Pain?  No/denies                        [facility] Adult PT Treatment/Exercise - 02/15/20 0001      Knee/Hip Exercises: Aerobic   Nustep  at EOS 4 minutes UE/LE      Knee/Hip Exercises: Standing   Heel Raises  20 reps    Hip Abduction  Both;2 sets;10 reps    Hip Extension  Both;2 sets;10 reps   RLE on 2 inch box for LLE clearance     Knee/Hip Exercises: Supine   Heel Slides  Both;10 reps    Bridges  Both;15 reps    Bridges Limitations  5 sec hold    Straight Leg Raises  Both;15 reps    Other Supine Knee/Hip Exercises  supine clamshell x10 each      Manual Therapy   Manual Therapy  Soft tissue mobilization    Manual therapy comments  completed separate rest of treatment    Soft tissue mobilization  gentle IASTM using tennis ball to RT lateral  and posterior hip (glute med, TFL, ITB) patient in SL              PT Education - 02/15/20 1046    Education Details  on exercise technique, ther ex progressions, POC and updated HEP    Person(s) Educated  Patient    Methods  Explanation;Handout    Comprehension  Verbalized understanding       PT Short Term Goals - 02/13/20 1432      PT SHORT TERM GOAL #1   Title  Patient will be independent with initial HEP to improve functional outcomes    Time  3    Period  Weeks    Status  Achieved    Target Date  02/10/20      PT SHORT TERM GOAL #2   Title  Patient will be able to ambulate at least 150 feet during with LRAD to demonstrate improved ability to perform functional mobility and associated tasks.    Baseline  240 feet with no AD    Time  3    Period  Weeks    Status  Achieved    Target Date  02/10/20        PT Long Term Goals - 02/13/20 1433      PT LONG TERM GOAL #1   Title  Patient will demonstrate an improvement on FOTO of 10% as evidence of patient's perceived improvement in  functional mobility.     Baseline  current: 43% limitation    Time  6    Period  Weeks    Status  On-going      PT LONG TERM GOAL #2   Title  Patient will be able to ambulate at least 250 feet during with LRAD to demonstrate improved ability to perform functional mobility and associated tasks.    Baseline  240 feet with no AD    Time  6    Period  Weeks    Status  On-going      PT LONG TERM GOAL #3   Title  Patient will have equal to or > 4/5 MMT throughout BLE to improve ability to perform functional mobility, stair ambulation and ADLs.    Baseline  see MMT    Time  6    Period  Weeks    Status  On-going      PT LONG TERM GOAL #4   Title  Patient will be able to perform stand x 5 in < 15 seconds to demonstrate improvement in functional mobility and reduced risk for falls.    Baseline  12 sec with no UE    Time  6    Period  Weeks    Status  Achieved            Plan - 02/15/20 1132    Clinical Impression Statement  Patient tolerated session well today. Patient was able to progress table exercise for hip strength and mobility, also was able to progress to standing hip strengthening with no increased complaint of pain. Patient did note some discomfort at end range RT hip ER during supine clamshells. Discomfort reduced with verbal cues to decrease ROM through pain free range. Patient showed difficulty with LLE ground clearance during standing hip extension, modified activity with patient standing on 2 inch box form improved clearance. Educated patient on and issued updated HEP handout. Patient will continue to benefit from skilled therapy services to progress hip strength and mobility to reduce pain and improve  functional mobility.    Personal Factors and Comorbidities  Age;Time since onset of injury/illness/exacerbation    Examination-Activity Limitations  Bathing;Squat;Bend;Bed Mobility;Stairs;Stand;Transfers;Dressing;Hygiene/Grooming;Lift;Locomotion Level     Examination-Participation Restrictions  Community Activity;Volunteer;Laundry;Yard Work    Stability/Clinical Decision Making  Stable/Uncomplicated    Rehab Potential  Fair    PT Frequency  2x / week    PT Duration  4 weeks    PT Treatment/Interventions  ADLs/Self Care Home Management;Aquatic Therapy;Biofeedback;Cryotherapy;Electrical Stimulation;Iontophoresis 4mg /ml Dexamethasone;Moist Heat;Traction;Balance training;Manual techniques;Therapeutic exercise;Vestibular;Splinting;Taping;Vasopneumatic Device;Functional mobility training;Therapeutic activities;Orthotic Fit/Training;Gait training;Stair training;DME Instruction;Patient/family education;Energy conservation;Dry needling;Joint Manipulations;Spinal Manipulations;Passive range of motion;Ultrasound;Parrafin;Fluidtherapy;Contrast Bath;Neuromuscular re-education;Compression bandaging    PT Next Visit Plan  Assess response to updated HEP. Continue to progress hip mobility and strength as tolerated. Add step ups next visit    PT Home Exercise Plan  4/20: hamstring stretch, prone hip extension, sidelying hip abduction, bridge, SLR  4/29: hip excursions; 5/6 lateral stepping, 02/15/20: supine clam, heel slides    Consulted and Agree with Plan of Care  Patient       Patient will benefit from skilled therapeutic intervention in order to improve the following deficits and impairments:  Abnormal gait, Decreased endurance, Hypomobility, Decreased activity tolerance, Decreased strength, Pain, Decreased balance, Decreased mobility, Difficulty walking, Decreased range of motion, Postural dysfunction, Improper body mechanics  Visit Diagnosis: Other abnormalities of gait and mobility  Pain in right hip  Chronic low back pain, unspecified back pain laterality, unspecified whether sciatica present     Problem List Patient Active Problem List   Diagnosis Date Noted  . S/P ORIF (open reduction internal fixation) fracture rIght hip IM Nail 12/21/12 12/14/2017   . Effusion of knee joint 10/05/2014  . Acute blood loss anemia 12/22/2012  . Closed hip fracture ([facility]) 12/21/2012  . Intertrochanteric fracture of right hip ([facility]) 12/21/2012    11:41 AM, 02/15/20 04/16/20 PT DPT  Physical Therapist with [facility]  [phone]   [facility] [address] [place], [place], [place] Phone: [phone]   Fax:  [phone]  Name: Angela [name] MRN: [id number] Grain Date of Birth: 1941-04-27

## 2020-02-20 ENCOUNTER — Ambulatory Visit (HOSPITAL_COMMUNITY): Payer: Medicare PPO | Admitting: Physical Therapy

## 2020-02-20 ENCOUNTER — Other Ambulatory Visit: Payer: Self-pay

## 2020-02-20 ENCOUNTER — Encounter (HOSPITAL_COMMUNITY): Payer: Self-pay | Admitting: Physical Therapy

## 2020-02-20 DIAGNOSIS — G8929 Other chronic pain: Secondary | ICD-10-CM | POA: Diagnosis not present

## 2020-02-20 DIAGNOSIS — R2689 Other abnormalities of gait and mobility: Secondary | ICD-10-CM | POA: Diagnosis not present

## 2020-02-20 DIAGNOSIS — M25551 Pain in right hip: Secondary | ICD-10-CM | POA: Diagnosis not present

## 2020-02-20 DIAGNOSIS — M545 Low back pain: Secondary | ICD-10-CM | POA: Diagnosis not present

## 2020-02-20 NOTE — Therapy (Signed)
[facility] [address] [place], [place], [address] Phone: [phone]   Fax:  [phone]  Physical Therapy Treatment  Patient Details  Name: Angela [name] MRN: [id number] Date of Birth: [birth year] Referring Provider (PT): Fuller Canada MD    Encounter Date: 02/20/2020  PT End of Session - 02/20/20 1126    Visit Number  10    Number of Visits  18    Date for PT Re-Evaluation  03/16/20    Authorization Type  [employer]    Authorization Time Period  02/17/20-03/16/20    Authorization - Visit Number  9    Authorization - Number of Visits  16    Progress Note Due on Visit  16    PT Start Time  1120    PT Stop Time  1206    PT Time Calculation (min)  46 min    Equipment Utilized During Treatment  --    Activity Tolerance  Patient tolerated treatment well    Behavior During Therapy  [facility] for tasks assessed/performed       Past Medical History:  Diagnosis Date  . Clostridium difficile infection 2010  . Hip fx (HCC)   . Use of cane as ambulatory aid     Past Surgical History:  Procedure Laterality Date  . INTRAMEDULLARY (IM) NAIL INTERTROCHANTERIC Right 12/21/2012   Procedure: INTRAMEDULLARY (IM) NAIL INTERTROCHANTRIC;  Surgeon: Vickki Hearing, MD;  Location: [facility];  Service: Orthopedics;  Laterality: Right;  . SKIN BIOPSY     3x 2013  . TONSILLECTOMY      There were no vitals filed for this visit.  Subjective Assessment - 02/20/20 1125    Subjective  Patient says she is doing much better. Reports no pain currently. Patient says she feels more comfortable walking, and that her son noted she is able to lift her leg to put on her socks much better.    Limitations  Lifting;Standing;Walking;House hold activities    How long can you stand comfortably?  20 minutes    How long can you walk comfortably?  20 minutes    Diagnostic tests  xrays    Patient Stated Goals  "I don't know"    Currently in Pain?  No/denies                         [facility] Adult PT Treatment/Exercise - 02/20/20 0001      Knee/Hip Exercises: Aerobic   Nustep  at EOS 5 minutes UE/LE Lv 2      Knee/Hip Exercises: Standing   Heel Raises  15 reps    Hip Abduction  Both;15 reps    Hip Extension  Both;1 set;15 reps    Forward Step Up  Both;10 reps;Hand Hold: 1;Step Height: 2"    Other Standing Knee Exercises  tandem stancee solid floor 2 x 30" each       Knee/Hip Exercises: Seated   Sit to Sand  --      Knee/Hip Exercises: Supine   Heel Slides  Both;10 reps    Bridges  Both;10 reps    Bridges Limitations  5 sec hold    Straight Leg Raises  Both;10 reps    Other Supine Knee/Hip Exercises  supine clamshell x10 each               PT Short Term Goals - 02/13/20 1432      PT SHORT TERM GOAL #1  Title  Patient will be independent with initial HEP to improve functional outcomes    Time  3    Period  Weeks    Status  Achieved    Target Date  02/10/20      PT SHORT TERM GOAL #2   Title  Patient will be able to ambulate at least 150 feet during with LRAD to demonstrate improved ability to perform functional mobility and associated tasks.    Baseline  240 feet with no AD    Time  3    Period  Weeks    Status  Achieved    Target Date  02/10/20        PT Long Term Goals - 02/13/20 1433      PT LONG TERM GOAL #1   Title  Patient will demonstrate an improvement on FOTO of 10% as evidence of patient's perceived improvement in functional mobility.     Baseline  current: 43% limitation    Time  6    Period  Weeks    Status  On-going      PT LONG TERM GOAL #2   Title  Patient will be able to ambulate at least 250 feet during with LRAD to demonstrate improved ability to perform functional mobility and associated tasks.    Baseline  240 feet with no AD    Time  6    Period  Weeks    Status  On-going      PT LONG TERM GOAL #3   Title  Patient will have equal to or > 4/5 MMT throughout BLE  to improve ability to perform functional mobility, stair ambulation and ADLs.    Baseline  see MMT    Time  6    Period  Weeks    Status  On-going      PT LONG TERM GOAL #4   Title  Patient will be able to perform stand x 5 in < 15 seconds to demonstrate improvement in functional mobility and reduced risk for falls.    Baseline  12 sec with no UE    Time  6    Period  Weeks    Status  Achieved            Plan - 02/20/20 1207    Clinical Impression Statement  Patient tolerated session well today with no increased complaint of pain. Patient able to progress standing hip strengthening as well as static standing balance. Patient shows good stability with tandem stance on level surface. Patient required verbal cues for foot placement and sequencing with added step ups on 2 inch box. Patient will continue to benefit from skilled therapy services to progress LE strengthening and balance to improve functional mobility and reduce risk for future falls    Personal Factors and Comorbidities  Age;Time since onset of injury/illness/exacerbation    Examination-Activity Limitations  Bathing;Squat;Bend;Bed Mobility;Stairs;Stand;Transfers;Dressing;Hygiene/Grooming;Lift;Locomotion Level    Examination-Participation Restrictions  Community Activity;Volunteer;Laundry;Yard Work    Stability/Clinical Decision Making  Stable/Uncomplicated    Rehab Potential  Fair    PT Frequency  2x / week    PT Duration  4 weeks    PT Treatment/Interventions  ADLs/Self Care Home Management;Aquatic Therapy;Biofeedback;Cryotherapy;Electrical Stimulation;Iontophoresis 4mg /ml Dexamethasone;Moist Heat;Traction;Balance training;Manual techniques;Therapeutic exercise;Vestibular;Splinting;Taping;Vasopneumatic Device;Functional mobility training;Therapeutic activities;Orthotic Fit/Training;Gait training;Stair training;DME Instruction;Patient/family education;Energy conservation;Dry needling;Joint Manipulations;Spinal  Manipulations;Passive range of motion;Ultrasound;Parrafin;Fluidtherapy;Contrast Bath;Neuromuscular re-education;Compression bandaging    PT Next Visit Plan  Continue to progress hip mobility and strength as tolerated. Increase step height next  visit. add foam to tandem stance    PT Home Exercise Plan  4/20: hamstring stretch, prone hip extension, sidelying hip abduction, bridge, SLR  4/29: hip excursions; 5/6 lateral stepping, 02/15/20: supine clam, heel slides    Consulted and Agree with Plan of Care  Patient       Patient will benefit from skilled therapeutic intervention in order to improve the following deficits and impairments:  Abnormal gait, Decreased endurance, Hypomobility, Decreased activity tolerance, Decreased strength, Pain, Decreased balance, Decreased mobility, Difficulty walking, Decreased range of motion, Postural dysfunction, Improper body mechanics  Visit Diagnosis: Other abnormalities of gait and mobility  Pain in right hip  Chronic low back pain, unspecified back pain laterality, unspecified whether sciatica present     Problem List Patient Active Problem List   Diagnosis Date Noted  . S/P ORIF (open reduction internal fixation) fracture rIght hip IM Nail 12/21/12 12/14/2017  . Effusion of knee joint 10/05/2014  . Acute blood loss anemia 12/22/2012  . Closed hip fracture ([place]) 12/21/2012  . Intertrochanteric fracture of right hip ([place]) 12/21/2012    12:12 PM, 02/20/20 Josue Hector PT DPT  Physical Therapist with [facility]  [phone] [place] [address] [place], [place], [address] Phone: [phone]   Fax:  [phone]  Name: Angela [name] MRN: [id number] Date of Birth: [birth year]

## 2020-02-22 ENCOUNTER — Encounter (HOSPITAL_COMMUNITY): Payer: Self-pay | Admitting: Physical Therapy

## 2020-02-22 ENCOUNTER — Other Ambulatory Visit: Payer: Self-pay

## 2020-02-22 ENCOUNTER — Ambulatory Visit (HOSPITAL_COMMUNITY): Payer: Medicare PPO | Admitting: Physical Therapy

## 2020-02-22 DIAGNOSIS — M25551 Pain in right hip: Secondary | ICD-10-CM

## 2020-02-22 DIAGNOSIS — R2689 Other abnormalities of gait and mobility: Secondary | ICD-10-CM | POA: Diagnosis not present

## 2020-02-22 DIAGNOSIS — M545 Low back pain, unspecified: Secondary | ICD-10-CM

## 2020-02-22 DIAGNOSIS — G8929 Other chronic pain: Secondary | ICD-10-CM | POA: Diagnosis not present

## 2020-02-22 NOTE — Therapy (Signed)
[facility] [address] [place], [place], [address] Phone: [phone]   Fax:  [phone]  Physical Therapy Treatment  Patient Details  Name: Angela [name] MRN: [id number] Date of Birth: 1941/02/04 Referring Provider (PT): Fuller Canada MD    Encounter Date: 02/22/2020  PT End of Session - 02/22/20 1048    Visit Number  11    Number of Visits  18    Date for PT Re-Evaluation  03/16/20    Authorization Type  [employer]    Authorization Time Period  02/17/20-03/16/20    Authorization - Visit Number  10    Authorization - Number of Visits  16    Progress Note Due on Visit  16    PT Start Time  1038    PT Stop Time  1120    PT Time Calculation (min)  42 min    Activity Tolerance  Patient tolerated treatment well;Patient limited by pain    Behavior During Therapy  [facility] for tasks assessed/performed       Past Medical History:  Diagnosis Date  . Clostridium difficile infection 2010  . Hip fx (HCC)   . Use of cane as ambulatory aid     Past Surgical History:  Procedure Laterality Date  . INTRAMEDULLARY (IM) NAIL INTERTROCHANTERIC Right 12/21/2012   Procedure: INTRAMEDULLARY (IM) NAIL INTERTROCHANTRIC;  Surgeon: Vickki Hearing, MD;  Location: [facility];  Service: Orthopedics;  Laterality: Right;  . SKIN BIOPSY     3x 2013  . TONSILLECTOMY      There were no vitals filed for this visit.  Subjective Assessment - 02/22/20 1044    Subjective  Patient says she is feeling "rough" today. She says she was walking her dog yesterday in her side yard when she got tripped up between her dog and a "mole hole" in the ground. She says she fell on her RT side, on her hip. She says she landed softly and reports no injury otherwise. says her RT hip is feeling sore today and was aching all night last night. She was able to get up by herself. Says she is now thinking about getting rid of her dog.    Limitations   Lifting;Standing;Walking;House hold activities    How long can you stand comfortably?  20 minutes    How long can you walk comfortably?  20 minutes    Diagnostic tests  xrays    Patient Stated Goals  "I don't know"    Currently in Pain?  Yes    Pain Score  7     Pain Location  Hip    Pain Orientation  Right;Lateral    Pain Descriptors / Indicators  Sore;Aching    Pain Type  Chronic pain    Pain Onset  Yesterday    Pain Frequency  Constant                        [facility] Adult PT Treatment/Exercise - 02/22/20 0001      Knee/Hip Exercises: Aerobic   Nustep  at EOS 5 minutes UE/LE Lv 1      Knee/Hip Exercises: Supine   Heel Slides  Right;1 set;10 reps    Bridges  Both;10 reps    Bridges Limitations  5 sec hold    Other Supine Knee/Hip Exercises  supine clamshell x10 each      Manual Therapy   Manual Therapy  Soft tissue mobilization;Passive ROM  Manual therapy comments  completed separate rest of treatment    Soft tissue mobilization  gentle IASTM using tennis ball to RT lateral and posterior hip (glute med, TFL, ITB) patient in SL     Passive ROM  gentle, partial range PROM RT hip flexion and ER to tolerance               PT Short Term Goals - 02/13/20 1432      PT SHORT TERM GOAL #1   Title  Patient will be independent with initial HEP to improve functional outcomes    Time  3    Period  Weeks    Status  Achieved    Target Date  02/10/20      PT SHORT TERM GOAL #2   Title  Patient will be able to ambulate at least 150 feet during 2MWT with LRAD to demonstrate improved ability to perform functional mobility and associated tasks.    Baseline  240 feet with no AD    Time  3    Period  Weeks    Status  Achieved    Target Date  02/10/20        PT Long Term Goals - 02/13/20 1433      PT LONG TERM GOAL #1   Title  Patient will demonstrate an improvement on FOTO of 10% as evidence of patient's perceived improvement in functional mobility.      Baseline  current: 43% limitation    Time  6    Period  Weeks    Status  On-going      PT LONG TERM GOAL #2   Title  Patient will be able to ambulate at least 250 feet during 2MWT with LRAD to demonstrate improved ability to perform functional mobility and associated tasks.    Baseline  240 feet with no AD    Time  6    Period  Weeks    Status  On-going      PT LONG TERM GOAL #3   Title  Patient will have equal to or > 4/5 MMT throughout BLE to improve ability to perform functional mobility, stair ambulation and ADLs.    Baseline  see MMT    Time  6    Period  Weeks    Status  On-going      PT LONG TERM GOAL #4   Title  Patient will be able to perform stand x 5 in < 15 seconds to demonstrate improvement in functional mobility and reduced risk for falls.    Baseline  12 sec with no UE    Time  6    Period  Weeks    Status  Achieved            Plan - 02/22/20 1118    Clinical Impression Statement  Patient slightly more sensitive to treatment today. Patient demos no significant change in functional level, but is more tender to touch about RT hip and reports increased subjective complaint of pain. Activity graded per patient pain tolerance today. Added gentle STM manual and PROM to RT hip to address pain and restriction. Patient reports decreased pain post treatment. Will resume prior ther ex as able. Patient will continue to benefit from skilled therapy services to progress hip strength and balance to reduce pain and reduce risk for future falls.    Personal Factors and Comorbidities  Age;Time since onset of injury/illness/exacerbation    Examination-Activity Limitations  Bathing;Squat;Bend;Bed Mobility;Stairs;Stand;Transfers;Dressing;Hygiene/Grooming;Lift;Locomotion Level  Examination-Participation Restrictions  Community Activity;Volunteer;Laundry;Yard Work    Stability/Clinical Decision Making  Stable/Uncomplicated    Rehab Potential  Fair    PT Frequency  2x / week    PT  Duration  4 weeks    PT Treatment/Interventions  ADLs/Self Care Home Management;Aquatic Therapy;Biofeedback;Cryotherapy;Electrical Stimulation;Iontophoresis 4mg /ml Dexamethasone;Moist Heat;Traction;Balance training;Manual techniques;Therapeutic exercise;Vestibular;Splinting;Taping;Vasopneumatic Device;Functional mobility training;Therapeutic activities;Orthotic Fit/Training;Gait training;Stair training;DME Instruction;Patient/family education;Energy conservation;Dry needling;Joint Manipulations;Spinal Manipulations;Passive range of motion;Ultrasound;Parrafin;Fluidtherapy;Contrast Bath;Neuromuscular re-education;Compression bandaging    PT Next Visit Plan  Resume prior ther ex as tolerated. Progress functional strength and balance as able. progress to ambulating over obstacles    PT Home Exercise Plan  4/20: hamstring stretch, prone hip extension, sidelying hip abduction, bridge, SLR  4/29: hip excursions; 5/6 lateral stepping, 02/15/20: supine clam, heel slides    Consulted and Agree with Plan of Care  Patient       Patient will benefit from skilled therapeutic intervention in order to improve the following deficits and impairments:  Abnormal gait, Decreased endurance, Hypomobility, Decreased activity tolerance, Decreased strength, Pain, Decreased balance, Decreased mobility, Difficulty walking, Decreased range of motion, Postural dysfunction, Improper body mechanics  Visit Diagnosis: Other abnormalities of gait and mobility  Pain in right hip  Chronic low back pain, unspecified back pain laterality, unspecified whether sciatica present     Problem List Patient Active Problem List   Diagnosis Date Noted  . S/P ORIF (open reduction internal fixation) fracture rIght hip IM Nail 12/21/12 12/14/2017  . Effusion of knee joint 10/05/2014  . Acute blood loss anemia 12/22/2012  . Closed hip fracture (HCC) 12/21/2012  . Intertrochanteric fracture of right hip (HCC) 12/21/2012    1:29 PM,  02/22/20 02/24/20 PT DPT  Physical Therapist with [place]  [facility]  [phone]   [facility] [address] [place], [place], [place] Phone: [phone]   Fax:  [phone]  Name: NOELIE RENFROW MRN: Verne Grain Date of Birth: [birth year]

## 2020-02-23 ENCOUNTER — Telehealth (HOSPITAL_COMMUNITY): Payer: Self-pay | Admitting: Physical Therapy

## 2020-02-23 NOTE — Telephone Encounter (Signed)
REtuned pt call to verify 6/4 explained to pt that she goes to see Dr. Romeo Apple in the Am and Comes to our office for PT at 3:30pm the same day 03/09/20. l/m

## 2020-02-28 ENCOUNTER — Encounter (HOSPITAL_COMMUNITY): Payer: Self-pay | Admitting: Physical Therapy

## 2020-02-28 ENCOUNTER — Ambulatory Visit (HOSPITAL_COMMUNITY): Payer: Medicare PPO | Admitting: Physical Therapy

## 2020-02-28 ENCOUNTER — Other Ambulatory Visit: Payer: Self-pay

## 2020-02-28 DIAGNOSIS — M545 Low back pain, unspecified: Secondary | ICD-10-CM

## 2020-02-28 DIAGNOSIS — M25551 Pain in right hip: Secondary | ICD-10-CM

## 2020-02-28 DIAGNOSIS — G8929 Other chronic pain: Secondary | ICD-10-CM | POA: Diagnosis not present

## 2020-02-28 DIAGNOSIS — R2689 Other abnormalities of gait and mobility: Secondary | ICD-10-CM | POA: Diagnosis not present

## 2020-02-28 NOTE — Therapy (Signed)
[place], [place], [address] Phone: [phone]   Fax:  [phone]  Physical Therapy Treatment  Patient Details  Name: Angela [name] MRN: [id number] Date of Birth: 01-31-1941 Referring Provider (PT): Arther Abbott MD    Encounter Date: 02/28/2020  PT End of Session - 02/28/20 0952    Visit Number  12    Number of Visits  18    Date for PT Re-Evaluation  03/16/20    Authorization Type  [employer]    Authorization Time Period  02/17/20-03/16/20    Authorization - Visit Number  11    Authorization - Number of Visits  16    Progress Note Due on Visit  16    PT Start Time  4403    PT Stop Time  4742    PT Time Calculation (min)  47 min    Activity Tolerance  Patient tolerated treatment well;No increased pain    Behavior During Therapy  [facility] for tasks assessed/performed       Past Medical History:  Diagnosis Date  . Clostridium difficile infection 2010  . Hip fx ([place])   . Use of cane as ambulatory aid     Past Surgical History:  Procedure Laterality Date  . INTRAMEDULLARY (IM) NAIL INTERTROCHANTERIC Right 12/21/2012   Procedure: INTRAMEDULLARY (IM) NAIL INTERTROCHANTRIC;  Surgeon: Carole Civil, MD;  Location: [facility];  Service: Orthopedics;  Laterality: Right;  . SKIN BIOPSY     3x 2013  . TONSILLECTOMY      There were no vitals filed for this visit.  Subjective Assessment - 02/28/20 0951    Subjective  Patient says she is doing much better today. Says she has been doing some exercise at home and notes she has been able to get up and walk better.    Limitations  Lifting;Standing;Walking;House hold activities    How long can you stand comfortably?  20 minutes    How long can you walk comfortably?  20 minutes    Diagnostic tests  xrays    Patient Stated Goals  "I don't know"    Currently in Pain?  Yes    Pain Score  2     Pain Location  Hip    Pain Orientation  Right;Lateral    Pain  Descriptors / Indicators  Aching    Pain Type  Chronic pain    Pain Onset  Yesterday                        [facility] Adult PT Treatment/Exercise - 02/28/20 0001      Knee/Hip Exercises: Aerobic   Nustep  at EOS 5 minutes UE/LE Lv 1      Knee/Hip Exercises: Standing   Heel Raises  Both;20 reps    Hip Abduction  Both;2 sets;10 reps    Abduction Limitations  RLE on 2 in box for LT abd    Hip Extension  Both;2 sets;10 reps    Extension Limitations  RLE on 2 in box for LT ext    Forward Step Up  Both;10 reps;Step Height: 2";Hand Hold: 2    Other Standing Knee Exercises  tandem stance solid floor 2 x 30" each     Other Standing Knee Exercises  SLS 3 x 15" with HHA x1       Knee/Hip Exercises: Seated   Sit to Sand  10 reps;without UE support  Manual Therapy   Manual Therapy  Soft tissue mobilization    Manual therapy comments  completed separate rest of treatment    Soft tissue mobilization  gentle IASTM using tennis ball to RT lateral and posterior hip (glute med, TFL, ITB) patient in SL                PT Short Term Goals - 02/13/20 1432      PT SHORT TERM GOAL #1   Title  Patient will be independent with initial HEP to improve functional outcomes    Time  3    Period  Weeks    Status  Achieved    Target Date  02/10/20      PT SHORT TERM GOAL #2   Title  Patient will be able to ambulate at least 150 feet during with LRAD to demonstrate improved ability to perform functional mobility and associated tasks.    Baseline  240 feet with no AD    Time  3    Period  Weeks    Status  Achieved    Target Date  02/10/20        PT Long Term Goals - 02/13/20 1433      PT LONG TERM GOAL #1   Title  Patient will demonstrate an improvement on FOTO of 10% as evidence of patient's perceived improvement in functional mobility.     Baseline  current: 43% limitation    Time  6    Period  Weeks    Status  On-going      PT LONG TERM GOAL #2   Title   Patient will be able to ambulate at least 250 feet during with LRAD to demonstrate improved ability to perform functional mobility and associated tasks.    Baseline  240 feet with no AD    Time  6    Period  Weeks    Status  On-going      PT LONG TERM GOAL #3   Title  Patient will have equal to or > 4/5 MMT throughout BLE to improve ability to perform functional mobility, stair ambulation and ADLs.    Baseline  see MMT    Time  6    Period  Weeks    Status  On-going      PT LONG TERM GOAL #4   Title  Patient will be able to perform stand x 5 in < 15 seconds to demonstrate improvement in functional mobility and reduced risk for falls.    Baseline  12 sec with no UE    Time  6    Period  Weeks    Status  Achieved            Plan - 02/28/20 1043    Clinical Impression Statement  Patient with improved tolerance to activity today, and was able to progress standing strengthening exercise. Patient notes feeling of instability with standing on RT in SLS. patient was challenged with forward step up on 4 inch box, but was able to complete reps with HHA for increased support. Patient demos improved static standing balance today, was well challenged with SLS, but able to complete with HHA x 1. Patient noted increased muscle fatigue with added exercise today but no increased pain. Patient will continue to benefit from skilled therapy services to progress hip strength and balance to improve functional mobility and reduce risk for falls.    Personal Factors and Comorbidities  Age;Time since onset of  injury/illness/exacerbation    Examination-Activity Limitations  Bathing;Squat;Bend;Bed Mobility;Stairs;Stand;Transfers;Dressing;Hygiene/Grooming;Lift;Locomotion Level    Examination-Participation Restrictions  Community Activity;Volunteer;Laundry;Yard Work    Stability/Clinical Decision Making  Stable/Uncomplicated    Rehab Potential  Fair    PT Frequency  2x / week    PT Duration  4 weeks     PT Treatment/Interventions  ADLs/Self Care Home Management;Aquatic Therapy;Biofeedback;Cryotherapy;Electrical Stimulation;Iontophoresis 4mg /ml Dexamethasone;Moist Heat;Traction;Balance training;Manual techniques;Therapeutic exercise;Vestibular;Splinting;Taping;Vasopneumatic Device;Functional mobility training;Therapeutic activities;Orthotic Fit/Training;Gait training;Stair training;DME Instruction;Patient/family education;Energy conservation;Dry needling;Joint Manipulations;Spinal Manipulations;Passive range of motion;Ultrasound;Parrafin;Fluidtherapy;Contrast Bath;Neuromuscular re-education;Compression bandaging    PT Next Visit Plan  Progress functional strength and balance as able. Add foam to static balance, add step downs and sidestepping. Progress to ambulating over obstacles    PT Home Exercise Plan  4/20: hamstring stretch, prone hip extension, sidelying hip abduction, bridge, SLR  4/29: hip excursions; 5/6 lateral stepping, 02/15/20: supine clam, heel slides    Consulted and Agree with Plan of Care  Patient       Patient will benefit from skilled therapeutic intervention in order to improve the following deficits and impairments:  Abnormal gait, Decreased endurance, Hypomobility, Decreased activity tolerance, Decreased strength, Pain, Decreased balance, Decreased mobility, Difficulty walking, Decreased range of motion, Postural dysfunction, Improper body mechanics  Visit Diagnosis: Other abnormalities of gait and mobility  Pain in right hip  Chronic low back pain, unspecified back pain laterality, unspecified whether sciatica present     Problem List Patient Active Problem List   Diagnosis Date Noted  . S/P ORIF (open reduction internal fixation) fracture rIght hip IM Nail 12/21/12 12/14/2017  . Effusion of knee joint 10/05/2014  . Acute blood loss anemia 12/22/2012  . Closed hip fracture (HCC) 12/21/2012  . Intertrochanteric fracture of right hip (HCC) 12/21/2012   10:47 AM,  02/28/20 03/01/20 PT DPT  Physical Therapist with [place]  [facility]  [phone]   [facility] [address] [place], [place], [place] Phone: [phone]   Fax:  [phone]  Name: LILIAH DORIAN MRN: Verne Grain Date of Birth: 1940/10/23

## 2020-03-02 ENCOUNTER — Telehealth (HOSPITAL_COMMUNITY): Payer: Self-pay | Admitting: Physical Therapy

## 2020-03-02 ENCOUNTER — Ambulatory Visit (HOSPITAL_COMMUNITY): Payer: Medicare PPO | Admitting: Physical Therapy

## 2020-03-02 NOTE — Telephone Encounter (Signed)
Pt did not show for appointment.  Called and left message concerning missed appointment and reminder of next appointment 6/2 at 11:15.  Lurena Nida, PTA/CLT [phone]

## 2020-03-07 ENCOUNTER — Ambulatory Visit (HOSPITAL_COMMUNITY): Payer: Medicare PPO | Attending: Orthopedic Surgery | Admitting: Physical Therapy

## 2020-03-07 ENCOUNTER — Encounter (HOSPITAL_COMMUNITY): Payer: Self-pay | Admitting: Physical Therapy

## 2020-03-07 ENCOUNTER — Other Ambulatory Visit: Payer: Self-pay

## 2020-03-07 DIAGNOSIS — R2689 Other abnormalities of gait and mobility: Secondary | ICD-10-CM

## 2020-03-07 DIAGNOSIS — M545 Low back pain: Secondary | ICD-10-CM | POA: Insufficient documentation

## 2020-03-07 DIAGNOSIS — M25551 Pain in right hip: Secondary | ICD-10-CM | POA: Diagnosis not present

## 2020-03-07 DIAGNOSIS — G8929 Other chronic pain: Secondary | ICD-10-CM | POA: Diagnosis not present

## 2020-03-07 NOTE — Patient Instructions (Signed)
Access Code: [email or web address] URL: [email or web address] Date: 03/07/2020 Prepared by: Georges Lynch  Exercises Heel rises with counter support - 2 x daily - 7 x weekly - 1 sets - 10 reps Standing Hip Abduction with Counter Support - 2 x daily - 7 x weekly - 1 sets - 10 reps Standing Hip Extension with Counter Support - 2 x daily - 7 x weekly - 1 sets - 10 reps Sit to Stand with Counter Support - 2 x daily - 7 x weekly - 1 sets - 10 reps

## 2020-03-07 NOTE — Therapy (Signed)
[facility] [address] [place], [place], [address] Phone: [phone]   Fax:  [phone]  Physical Therapy Treatment  Patient Details  Name: Angela [name] MRN: [id number] Date of Birth: 09/01/1941 Referring Provider (PT): Fuller Canada MD    Encounter Date: 03/07/2020  PT End of Session - 03/07/20 1125    Visit Number  13    Number of Visits  18    Date for PT Re-Evaluation  03/16/20    Authorization Type  [employer]    Authorization Time Period  02/17/20-03/16/20    Authorization - Visit Number  12    Authorization - Number of Visits  16    Progress Note Due on Visit  16    PT Start Time  1120    PT Stop Time  1205    PT Time Calculation (min)  45 min    Activity Tolerance  Patient tolerated treatment well;No increased pain    Behavior During Therapy  WFL for tasks assessed/performed       Past Medical History:  Diagnosis Date  . Clostridium difficile infection 2010  . Hip fx (HCC)   . Use of cane as ambulatory aid     Past Surgical History:  Procedure Laterality Date  . INTRAMEDULLARY (IM) NAIL INTERTROCHANTERIC Right 12/21/2012   Procedure: INTRAMEDULLARY (IM) NAIL INTERTROCHANTRIC;  Surgeon: Vickki Hearing, MD;  Location: [facility];  Service: Orthopedics;  Laterality: Right;  . SKIN BIOPSY     3x 2013  . TONSILLECTOMY      There were no vitals filed for this visit.  Subjective Assessment - 03/07/20 1123    Subjective  Patient says she is doing well today. Says she has not had much pain lately. Says she still has trouble stepping up on last step at home.    Limitations  Lifting;Standing;Walking;House hold activities    How long can you stand comfortably?  20 minutes    How long can you walk comfortably?  20 minutes    Diagnostic tests  xrays    Patient Stated Goals  "I don't know"    Currently in Pain?  No/denies    Pain Onset  [facility]  Treatment/Exercise - 03/07/20 0001      Knee/Hip Exercises: Aerobic   Nustep  at EOS 4 minutes UE/LE Lv 1      Knee/Hip Exercises: Standing   Heel Raises  Both;20 reps    Forward Step Up  Both;10 reps;Hand Hold: 2;Step Height: 4"    Other Standing Knee Exercises  tandem stance 2 x 30" solid floor, 2 x 30" foam with HHA; 4 inch step taps x20    Other Standing Knee Exercises  SLS 3 x 15" with HHA x1; sidestepping 2 RT       Knee/Hip Exercises: Seated   Sit to Sand  2 sets;10 reps;without UE support               PT Short Term Goals - 02/13/20 1432      PT SHORT TERM GOAL #1   Title  Patient will be independent with initial HEP to improve functional outcomes    Time  3    Period  Weeks    Status  Achieved    Target Date  02/10/20      PT SHORT TERM  GOAL #2   Title  Patient will be able to ambulate at least 150 feet during with LRAD to demonstrate improved ability to perform functional mobility and associated tasks.    Baseline  240 feet with no AD    Time  3    Period  Weeks    Status  Achieved    Target Date  02/10/20        PT Long Term Goals - 02/13/20 1433      PT LONG TERM GOAL #1   Title  Patient will demonstrate an improvement on FOTO of 10% as evidence of patient's perceived improvement in functional mobility.     Baseline  current: 43% limitation    Time  6    Period  Weeks    Status  On-going      PT LONG TERM GOAL #2   Title  Patient will be able to ambulate at least 250 feet during with LRAD to demonstrate improved ability to perform functional mobility and associated tasks.    Baseline  240 feet with no AD    Time  6    Period  Weeks    Status  On-going      PT LONG TERM GOAL #3   Title  Patient will have equal to or > 4/5 MMT throughout BLE to improve ability to perform functional mobility, stair ambulation and ADLs.    Baseline  see MMT    Time  6    Period  Weeks    Status  On-going      PT LONG TERM GOAL #4   Title  Patient  will be able to perform stand x 5 in < 15 seconds to demonstrate improvement in functional mobility and reduced risk for falls.    Baseline  12 sec with no UE    Time  6    Period  Weeks    Status  Achieved            Plan - 03/07/20 1205    Clinical Impression Statement  Patient requires increased verbal cueing for attention to task and form with activity today. Patient was able to progress to tandem stance on foam, but required intermittent HHA. Patient able to progress to forward step up on 4-inch box but with increased difficulty on RLE due to weakness. Added sidestepping for balance and hip strengthening, patient educated on purpose and function. Patient will continue to benefit from skilled therapy services to progress hip strength and mobility to reduce pain and improve LOF with functional mobility.    Personal Factors and Comorbidities  Age;Time since onset of injury/illness/exacerbation    Examination-Activity Limitations  Bathing;Squat;Bend;Bed Mobility;Stairs;Stand;Transfers;Dressing;Hygiene/Grooming;Lift;Locomotion Level    Examination-Participation Restrictions  Community Activity;Volunteer;Laundry;Yard Work    Stability/Clinical Decision Making  Stable/Uncomplicated    Rehab Potential  Fair    PT Frequency  2x / week    PT Duration  4 weeks    PT Treatment/Interventions  ADLs/Self Care Home Management;Aquatic Therapy;Biofeedback;Cryotherapy;Electrical Stimulation;Iontophoresis 4mg /ml Dexamethasone;Moist Heat;Traction;Balance training;Manual techniques;Therapeutic exercise;Vestibular;Splinting;Taping;Vasopneumatic Device;Functional mobility training;Therapeutic activities;Orthotic Fit/Training;Gait training;Stair training;DME Instruction;Patient/family education;Energy conservation;Dry needling;Joint Manipulations;Spinal Manipulations;Passive range of motion;Ultrasound;Parrafin;Fluidtherapy;Contrast Bath;Neuromuscular re-education;Compression bandaging    PT Next Visit Plan   Progress functional strength and balance as able. Add foam to static balance, add step downs. Progress to ambulating over obstacles    PT Home Exercise Plan  4/20: hamstring stretch, prone hip extension, sidelying hip abduction, bridge, SLR  4/29: hip excursions; 5/6 lateral stepping, 02/15/20: supine clam, heel  slides    Consulted and Agree with Plan of Care  Patient       Patient will benefit from skilled therapeutic intervention in order to improve the following deficits and impairments:  Abnormal gait, Decreased endurance, Hypomobility, Decreased activity tolerance, Decreased strength, Pain, Decreased balance, Decreased mobility, Difficulty walking, Decreased range of motion, Postural dysfunction, Improper body mechanics  Visit Diagnosis: Other abnormalities of gait and mobility  Pain in right hip  Chronic low back pain, unspecified back pain laterality, unspecified whether sciatica present     Problem List Patient Active Problem List   Diagnosis Date Noted  . S/P ORIF (open reduction internal fixation) fracture rIght hip IM Nail 12/21/12 12/14/2017  . Effusion of knee joint 10/05/2014  . Acute blood loss anemia 12/22/2012  . Closed hip fracture ([place]) 12/21/2012  . Intertrochanteric fracture of right hip ([place]) 12/21/2012   12:13 PM, 03/07/20 Josue Hector PT DPT  Physical Therapist with [facility]  [phone] [place], [place], [address] Phone: [phone]   Fax:  [phone]  Name: JUSTYCE YEATER MRN: [id number] Date of Birth: September 01, 1941

## 2020-03-09 ENCOUNTER — Other Ambulatory Visit: Payer: Self-pay

## 2020-03-09 ENCOUNTER — Ambulatory Visit (HOSPITAL_COMMUNITY): Payer: Medicare PPO

## 2020-03-09 ENCOUNTER — Encounter (HOSPITAL_COMMUNITY): Payer: Self-pay

## 2020-03-09 ENCOUNTER — Ambulatory Visit (INDEPENDENT_AMBULATORY_CARE_PROVIDER_SITE_OTHER): Payer: Medicare PPO | Admitting: Orthopedic Surgery

## 2020-03-09 VITALS — Ht 59.0 in | Wt 111.0 lb

## 2020-03-09 DIAGNOSIS — M5441 Lumbago with sciatica, right side: Secondary | ICD-10-CM | POA: Diagnosis not present

## 2020-03-09 DIAGNOSIS — M25551 Pain in right hip: Secondary | ICD-10-CM

## 2020-03-09 DIAGNOSIS — G8929 Other chronic pain: Secondary | ICD-10-CM | POA: Diagnosis not present

## 2020-03-09 DIAGNOSIS — M545 Low back pain: Secondary | ICD-10-CM | POA: Diagnosis not present

## 2020-03-09 DIAGNOSIS — R2689 Other abnormalities of gait and mobility: Secondary | ICD-10-CM

## 2020-03-09 NOTE — Progress Notes (Signed)
Chief Complaint  Patient presents with  . Follow-up    Recheck on back.   Encounter Diagnosis  Name Primary?  . Right-sided low back pain with right-sided sciatica, unspecified chronicity Yes     LAST VISIT Angela [name] was seen by physical therapy for ongoing back pain.  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  . Right-sided low back pain with right-sided sciatica, unspecified chronicity Yes

## 2020-03-09 NOTE — Therapy (Signed)
[facility] [address] [place], [place], [address] Phone: [phone]   Fax:  [phone]  Physical Therapy Treatment  Patient Details  Name: Angela [name] MRN: [id number] Date of Birth: 03/24/1941 Referring Provider (PT): Fuller Canada MD    Encounter Date: 03/09/2020  PT End of Session - 03/09/20 1540    Visit Number  14    Number of Visits  18    Date for PT Re-Evaluation  03/16/20    Authorization Type  [employer]    Authorization Time Period  02/17/20-03/16/20    Authorization - Visit Number  13    Authorization - Number of Visits  16    Progress Note Due on Visit  16    PT Start Time  1533    PT Stop Time  1615    PT Time Calculation (min)  42 min    Equipment Utilized During Treatment  Gait belt    Activity Tolerance  Patient tolerated treatment well;No increased pain    Behavior During Therapy  WFL for tasks assessed/performed       Past Medical History:  Diagnosis Date  . Clostridium difficile infection 2010  . Hip fx (HCC)   . Use of cane as ambulatory aid     Past Surgical History:  Procedure Laterality Date  . INTRAMEDULLARY (IM) NAIL INTERTROCHANTERIC Right 12/21/2012   Procedure: INTRAMEDULLARY (IM) NAIL INTERTROCHANTRIC;  Surgeon: Vickki Hearing, MD;  Location: [facility];  Service: Orthopedics;  Laterality: Right;  . SKIN BIOPSY     3x 2013  . TONSILLECTOMY      There were no vitals filed for this visit.  Subjective Assessment - 03/09/20 1536    Subjective  Pt stated she is feeling good today, no reports of pain currently.  Reports most difficulty with Lt LE ascending stairs.    Patient Stated Goals  "I don't know"    Currently in Pain?  No/denies                        [facility] Adult PT Treatment/Exercise - 03/09/20 0001      Knee/Hip Exercises: Standing   Forward Step Up  Both;10 reps;Hand Hold: 2;Step Height: 4"    Step Down  Both;10 reps;Hand Hold: 2;Step Height: 4"    SLS with Vectors  3x5" wiht HHA    Other Standing Knee Exercises  Foam tandem stance 3x 15" on foam with intermittent HHA    Other Standing Knee Exercises  SLS 3 x 15" with HHA x1; sidestepping 2 RT                PT Short Term Goals - 02/13/20 1432      PT SHORT TERM GOAL #1   Title  Patient will be independent with initial HEP to improve functional outcomes    Time  3    Period  Weeks    Status  Achieved    Target Date  02/10/20      PT SHORT TERM GOAL #2   Title  Patient will be able to ambulate at least 150 feet during with LRAD to demonstrate improved ability to perform functional mobility and associated tasks.    Baseline  240 feet with no AD    Time  3    Period  Weeks    Status  Achieved    Target Date  02/10/20        PT Long  Term Goals - 02/13/20 1433      PT LONG TERM GOAL #1   Title  Patient will demonstrate an improvement on FOTO of 10% as evidence of patient's perceived improvement in functional mobility.     Baseline  current: 43% limitation    Time  6    Period  Weeks    Status  On-going      PT LONG TERM GOAL #2   Title  Patient will be able to ambulate at least 250 feet during with LRAD to demonstrate improved ability to perform functional mobility and associated tasks.    Baseline  240 feet with no AD    Time  6    Period  Weeks    Status  On-going      PT LONG TERM GOAL #3   Title  Patient will have equal to or > 4/5 MMT throughout BLE to improve ability to perform functional mobility, stair ambulation and ADLs.    Baseline  see MMT    Time  6    Period  Weeks    Status  On-going      PT LONG TERM GOAL #4   Title  Patient will be able to perform stand x 5 in < 15 seconds to demonstrate improvement in functional mobility and reduced risk for falls.    Baseline  12 sec with no UE    Time  6    Period  Weeks    Status  Achieved            Plan - 03/09/20 1607    Clinical Impression Statement  Pt required verbal cueing  to stay on task and multimodal cueing for proper form and mechanics with majority of activities today.  Continued step up training wiht cueing to reduce UE A for maximal leg strengthening.  Added step downs for eccentric quad strengthening.  Also progressed balance wiht dynamic surface and vector stance for SLS/ gluteal strenghtening.  Pt challenged wiht dynamic surface, required HHA and cueing to imporve posture to assist with balalnce.  No reports of pain through session, was limited by fatiuge at EOS.    Personal Factors and Comorbidities  Age;Time since onset of injury/illness/exacerbation    Examination-Activity Limitations  Bathing;Squat;Bend;Bed Mobility;Stairs;Stand;Transfers;Dressing;Hygiene/Grooming;Lift;Locomotion Level    Examination-Participation Restrictions  Community Activity;Volunteer;Laundry;Yard Work    Conservation officer, historic buildings  Stable/Uncomplicated    Optometrist  Low    Rehab Potential  Fair    PT Frequency  2x / week    PT Duration  4 weeks    PT Treatment/Interventions  ADLs/Self Care Home Management;Aquatic Therapy;Biofeedback;Cryotherapy;Electrical Stimulation;Iontophoresis 4mg /ml Dexamethasone;Moist Heat;Traction;Balance training;Manual techniques;Therapeutic exercise;Vestibular;Splinting;Taping;Vasopneumatic Device;Functional mobility training;Therapeutic activities;Orthotic Fit/Training;Gait training;Stair training;DME Instruction;Patient/family education;Energy conservation;Dry needling;Joint Manipulations;Spinal Manipulations;Passive range of motion;Ultrasound;Parrafin;Fluidtherapy;Contrast Bath;Neuromuscular re-education;Compression bandaging    PT Next Visit Plan  Progress functional strength and balance as able. Continue with foam to static balance and quad/hip strengthening. Progress to ambulating over obstacles    PT Home Exercise Plan  4/20: hamstring stretch, prone hip extension, sidelying hip abduction, bridge, SLR  4/29: hip excursions; 5/6  lateral stepping, 02/15/20: supine clam, heel slides       Patient will benefit from skilled therapeutic intervention in order to improve the following deficits and impairments:  Abnormal gait, Decreased endurance, Hypomobility, Decreased activity tolerance, Decreased strength, Pain, Decreased balance, Decreased mobility, Difficulty walking, Decreased range of motion, Postural dysfunction, Improper body mechanics  Visit Diagnosis: Pain in right hip  Chronic low back pain, unspecified  back pain laterality, unspecified whether sciatica present  Other abnormalities of gait and mobility     Problem List Patient Active Problem List   Diagnosis Date Noted  . S/P ORIF (open reduction internal fixation) fracture rIght hip IM Nail 12/21/12 12/14/2017  . Effusion of knee joint 10/05/2014  . Acute blood loss anemia 12/22/2012  . Closed hip fracture ([place]) 12/21/2012  . Intertrochanteric fracture of right hip ([place]) 12/21/2012   Angela [name], LPTA/CLT; CBIS [id number]  Angela [name] 03/09/2020, 6:25 PM  [facility] [address] [place], [place], [address] Phone: [phone]   Fax:  [phone]  Name: Angela [name] MRN: [id number] Date of Birth: [birth year]

## 2020-03-12 ENCOUNTER — Ambulatory Visit (HOSPITAL_COMMUNITY): Payer: Medicare PPO | Admitting: Physical Therapy

## 2020-03-12 DIAGNOSIS — Z9181 History of falling: Secondary | ICD-10-CM | POA: Diagnosis not present

## 2020-03-12 DIAGNOSIS — R32 Unspecified urinary incontinence: Secondary | ICD-10-CM | POA: Diagnosis not present

## 2020-03-12 DIAGNOSIS — L309 Dermatitis, unspecified: Secondary | ICD-10-CM | POA: Diagnosis not present

## 2020-03-12 DIAGNOSIS — F0390 Unspecified dementia without behavioral disturbance: Secondary | ICD-10-CM | POA: Diagnosis not present

## 2020-03-14 ENCOUNTER — Telehealth (HOSPITAL_COMMUNITY): Payer: Self-pay | Admitting: Specialist

## 2020-03-14 NOTE — Telephone Encounter (Signed)
Called patient to schedule additional 4 visits - as patient thought she was dc on Friday 6/4.  Upon further investigation, she was not dc and can actually have 4 additional visits scheduled. Shirlean Mylar, MHA, OTR/L [phone]

## 2020-03-19 ENCOUNTER — Encounter (HOSPITAL_COMMUNITY): Payer: Self-pay | Admitting: Physical Therapy

## 2020-03-19 ENCOUNTER — Telehealth: Payer: Self-pay | Admitting: Orthopedic Surgery

## 2020-03-19 ENCOUNTER — Ambulatory Visit (HOSPITAL_COMMUNITY): Payer: Medicare PPO | Admitting: Physical Therapy

## 2020-03-19 ENCOUNTER — Other Ambulatory Visit: Payer: Self-pay

## 2020-03-19 DIAGNOSIS — M545 Low back pain, unspecified: Secondary | ICD-10-CM

## 2020-03-19 DIAGNOSIS — M25551 Pain in right hip: Secondary | ICD-10-CM

## 2020-03-19 DIAGNOSIS — G8929 Other chronic pain: Secondary | ICD-10-CM

## 2020-03-19 DIAGNOSIS — R2689 Other abnormalities of gait and mobility: Secondary | ICD-10-CM

## 2020-03-19 NOTE — Telephone Encounter (Signed)
Call received from patient; wanted to relay to Dr Romeo Apple that she had a fall flat onto the floor very early this morning at home, and was alone at the time. Said after crawling around without glasses, she was able to yell for a neighbor. States the neighbor called 911 and that EMS came, evaluated her, and that they got her standing up and walking; said they determined that she is stable. Said she also did go to her physical therapy appointment, and that they are working with her. Relayed to patient that she should also contact her primary care, to discuss her living at home situation - said her son is away a lot. Said she is doing okay.

## 2020-03-19 NOTE — Therapy (Signed)
[facility] [address] [place], [place], [address] Phone: [phone]   Fax:  [phone]  Physical Therapy Treatment/ Progress Note  Patient Details  Name: Angela [name] MRN: [id number] Date of Birth: Aug 23, 1941 Referring Provider (PT): Arther Abbott MD    Encounter Date: 03/19/2020   Progress Note Reporting Period 02/13/20 to 03/19/20  See note below for Objective Data and Assessment of Progress/Goals.        PT End of Session - 03/19/20 1003    Visit Number 15    Number of Visits 20    Date for PT Re-Evaluation 04/06/20    Authorization Type [employer]    Authorization Time Period 02/17/20-03/16/20 (submitted new auth on 03/19/20)    Authorization - Visit Number 14    Authorization - Number of Visits 16    Progress Note Due on Visit 16    PT Start Time 0955    PT Stop Time 1030    PT Time Calculation (min) 35 min    Equipment Utilized During Treatment Gait belt    Activity Tolerance Patient tolerated treatment well;Patient limited by pain    Behavior During Therapy WFL for tasks assessed/performed           Past Medical History:  Diagnosis Date   Clostridium difficile infection 2010   Hip fx ([place])    Use of cane as ambulatory aid     Past Surgical History:  Procedure Laterality Date   INTRAMEDULLARY (IM) NAIL INTERTROCHANTERIC Right 12/21/2012   Procedure: INTRAMEDULLARY (IM) NAIL INTERTROCHANTRIC;  Surgeon: Carole Civil, MD;  Location: [facility];  Service: Orthopedics;  Laterality: Right;   SKIN BIOPSY     3x 2013   TONSILLECTOMY      There were no vitals filed for this visit.   Subjective Assessment - 03/19/20 1001    Subjective Patient says she fell out of bed last night trying to get to her bathroom. Says she had to scoot to a window to yell for help, and that her neighbor came to help her. She says they called 911 to get further assistance. She says the paramedics helped her to her feet  and she was able to walk independent. Says they released her, and she did not have to go to hospital. She says she is stiff today, and sore all over. States she was evaluated and did not break anything.    Pertinent History RT hip ORIF in 2019    Limitations Lifting;Standing;Walking;House hold activities    How long can you stand comfortably? 20 minutes    How long can you walk comfortably? 20 minutes    Diagnostic tests xrays    Patient Stated Goals "I don't know"    Currently in Pain? Yes    Pain Score 3     Pain Location Hip    Pain Orientation Right;Left;Anterior    Pain Descriptors / Indicators Sore    Pain Onset Yesterday    Pain Frequency Constant    Aggravating Factors  sitting still    Pain Relieving Factors standing, walking    Effect of Pain on Daily Activities Limits              [facility] PT Assessment - 03/19/20 0001      Assessment   Medical Diagnosis LBP    Referring Provider (PT) Arther Abbott MD     Next MD Visit --   none scheduled   Prior Therapy yes  Precautions   Precautions Fall      Restrictions   Weight Bearing Restrictions No      Balance Screen   Has the patient fallen in the past 6 months Yes    How many times? 3    Has the patient had a decrease in activity level because of a fear of falling?  No      Home Ec[employer] residence    Living Arrangements Children      Prior Function   Level of Independence Independent with basic ADLs      Cognition   Overall Cognitive Status Difficult to assess   frequently gives indirect answers to subjective questions     Observation/Other Assessments   Focus on Therapeutic Outcomes (FOTO)  deferred, patient unable to answer directly, frequently loses train of thought (i.e. answer to question #1, patient begins talking about her relationship with cleaning lady)      Strength   Overall Strength Comments tested in sitting position     Right Hip Flexion 4+/5    Right  Hip Extension 4/5   was 4-   Right Hip ABduction 4+/5   was 4-   Left Hip Flexion 5/5    Left Hip Extension 4/5   was 4-   Left Hip ABduction 4+/5   was 4   Right Knee Flexion 4+/5    Right Knee Extension 5/5    Left Knee Flexion 4+/5    Left Knee Extension 5/5      Transfers   Five time sit to stand comments  16 sec, no UE       Ambulation/Gait   Ambulation/Gait Yes    Ambulation/Gait Assistance 5: Supervision    Ambulation Distance (Feet) 220 Feet   was 240   Assistive device None    Gait Pattern Decreased stance time - right;Decreased step length - right;Decreased step length - left;Decreased stride length;Trunk flexed;Narrow base of support    Ambulation Surface Level;Indoor    Gait Comments 2MWT      Static Standing Balance   Static Standing Balance -  Activities  Tandam Stance - Right Leg;Tandam Stance - Left Leg    Static Standing - Comment/# of Minutes 30 sec min sway; 30 sec semi tandem                                  PT Education - 03/19/20 1003    Education Details on reassessment findings and POC    Person(s) Educated Patient    Methods Explanation    Comprehension Verbalized understanding            PT Short Term Goals - 03/19/20 1031      PT SHORT TERM GOAL #1   Title Patient will be independent with initial HEP to improve functional outcomes    Baseline Reports compliant    Time 3    Period Weeks    Status Achieved    Target Date 02/10/20      PT SHORT TERM GOAL #2   Title Patient will be able to ambulate at least 150 feet during 2MWT with LRAD to demonstrate improved ability to perform functional mobility and associated tasks.    Baseline 220 feet with no AD    Time 3    Period Weeks    Status Achieved    Target Date 02/10/20  PT Long Term Goals - 03/19/20 1031      PT LONG TERM GOAL #1   Title Patient will demonstrate an improvement on FOTO of 10% as evidence of patient's perceived improvement in  functional mobility.     Baseline difficulty answering questions    Time 6    Period Weeks    Status Deferred      PT LONG TERM GOAL #2   Title Patient will be able to ambulate at least 250 feet during 2MWT with LRAD to demonstrate improved ability to perform functional mobility and associated tasks.    Baseline 220 feet with no AD    Time 6    Period Weeks    Status On-going      PT LONG TERM GOAL #3   Title Patient will have equal to or > 4/5 MMT throughout BLE to improve ability to perform functional mobility, stair ambulation and ADLs.    Baseline see MMT    Time 6    Period Weeks    Status Partially Met      PT LONG TERM GOAL #4   Title Patient will be able to perform stand x 5 in < 15 seconds to demonstrate improvement in functional mobility and reduced risk for falls.    Baseline 16 sec (was 12 sec)    Time 6    Period Weeks    Status On-going                 Plan - 03/19/20 1030    Clinical Impression Statement Patient has shown improvement since starting therapy. Patient continues to have issues with balance, reporting 2 falls in recent past, causing episodes on increased pain and stiffness, limiting mobility, such as the case today. Patient does show overall improvement in original complaint of subjective pain, and shows improved walking. Patient continues to be limited by altered mechanics, gait abnormality and balance deficits which continue to negatively impact functional ability. Patient will continue to benefit from skilled therapy service to address these deficits to reduce pain, improve functional mobility and reduce risk for future falls.    Personal Factors and Comorbidities Age;Time since onset of injury/illness/exacerbation    Examination-Activity Limitations Bathing;Squat;Bend;Bed Mobility;Stairs;Stand;Transfers;Dressing;Hygiene/Grooming;Lift;Locomotion Level    Examination-Participation Restrictions Community Activity;Volunteer;Laundry;Yard Work     Stability/Clinical Decision Making Stable/Uncomplicated    Rehab Potential Fair    PT Frequency 2x / week    PT Duration 3 weeks    PT Treatment/Interventions ADLs/Self Care Home Management;Aquatic Therapy;Biofeedback;Cryotherapy;Electrical Stimulation;Iontophoresis 47m/ml Dexamethasone;Moist Heat;Traction;Balance training;Manual techniques;Therapeutic exercise;Vestibular;Splinting;Taping;Vasopneumatic Device;Functional mobility training;Therapeutic activities;Orthotic Fit/Training;Gait training;Stair training;DME Instruction;Patient/family education;Energy conservation;Dry needling;Joint Manipulations;Spinal Manipulations;Passive range of motion;Ultrasound;Parrafin;Fluidtherapy;Contrast Bath;Neuromuscular re-education;Compression bandaging    PT Next Visit Plan Progress functional strength and balance as able. Continue with foam to static balance and quad/hip strengthening. Progress to ambulating over obstacles    PT Home Exercise Plan 4/20: hamstring stretch, prone hip extension, sidelying hip abduction, bridge, SLR  4/29: hip excursions; 5/6 lateral stepping, 02/15/20: supine clam, heel slides    Consulted and Agree with Plan of Care Patient           Patient will benefit from skilled therapeutic intervention in order to improve the following deficits and impairments:  Abnormal gait, Decreased endurance, Hypomobility, Decreased activity tolerance, Decreased strength, Pain, Decreased balance, Decreased mobility, Difficulty walking, Decreased range of motion, Postural dysfunction, Improper body mechanics  Visit Diagnosis: Pain in right hip  Chronic low back pain, unspecified back pain laterality, unspecified whether sciatica present  Other abnormalities  of gait and mobility     Problem List Patient Active Problem List   Diagnosis Date Noted   S/P ORIF (open reduction internal fixation) fracture rIght hip IM Nail 12/21/12 12/14/2017   Effusion of knee joint 10/05/2014   Acute blood  loss anemia 12/22/2012   Closed hip fracture ([place]) 12/21/2012   Intertrochanteric fracture of right hip ([place]) 12/21/2012   10:39 AM, 03/19/20 Josue Hector PT DPT  Physical Therapist with [facility]  [phone] [place], [place], [address] Phone: [phone]   Fax:  [phone]  Name: Angela [name] MRN: [id number] Date of Birth: 08/06/1941

## 2020-03-20 ENCOUNTER — Ambulatory Visit (HOSPITAL_COMMUNITY): Payer: Medicare PPO | Admitting: Physical Therapy

## 2020-03-20 ENCOUNTER — Encounter (HOSPITAL_COMMUNITY): Payer: Self-pay

## 2020-03-20 NOTE — Telephone Encounter (Signed)
I am sorry she fell If she is injured she needs appointment If she is not injured okay for her to follow up with PCP for social/ living situation

## 2020-03-20 NOTE — Telephone Encounter (Addendum)
Patient was offered appointment. Will let us know if needed.

## 2020-03-21 ENCOUNTER — Other Ambulatory Visit: Payer: Self-pay

## 2020-03-21 ENCOUNTER — Ambulatory Visit (HOSPITAL_COMMUNITY): Payer: Medicare PPO | Admitting: Physical Therapy

## 2020-03-21 DIAGNOSIS — M25551 Pain in right hip: Secondary | ICD-10-CM | POA: Diagnosis not present

## 2020-03-21 DIAGNOSIS — M545 Low back pain, unspecified: Secondary | ICD-10-CM

## 2020-03-21 DIAGNOSIS — G8929 Other chronic pain: Secondary | ICD-10-CM | POA: Diagnosis not present

## 2020-03-21 DIAGNOSIS — R2689 Other abnormalities of gait and mobility: Secondary | ICD-10-CM | POA: Diagnosis not present

## 2020-03-21 NOTE — Therapy (Signed)
[facility] [place], [place], [address] Phone: [phone]   Fax:  [phone]  Physical Therapy Treatment  Patient Details  Name: Angela [name] MRN: [id number] Date of Birth: 1941/03/14 Referring Provider (PT): Arther Abbott MD    Encounter Date: 03/21/2020   PT End of Session - 03/21/20 1201    Visit Number 16    Number of Visits 20    Date for PT Re-Evaluation 04/06/20    Authorization Type [employer]    Authorization Time Period 02/17/20-03/16/20 (submitted new auth on 03/19/20)    Authorization - Visit Number 15    Authorization - Number of Visits 20    Progress Note Due on Visit 25    PT Start Time 1055    PT Stop Time 1135    PT Time Calculation (min) 40 min    Equipment Utilized During Treatment Gait belt    Activity Tolerance Patient tolerated treatment well;Patient limited by pain    Behavior During Therapy [facility] for tasks assessed/performed           Past Medical History:  Diagnosis Date  . Clostridium difficile infection 2010  . Hip fx ([place])   . Use of cane as ambulatory aid     Past Surgical History:  Procedure Laterality Date  . INTRAMEDULLARY (IM) NAIL INTERTROCHANTERIC Right 12/21/2012   Procedure: INTRAMEDULLARY (IM) NAIL INTERTROCHANTRIC;  Surgeon: Carole Civil, MD;  Location: [facility];  Service: Orthopedics;  Laterality: Right;  . SKIN BIOPSY     3x 2013  . TONSILLECTOMY      There were no vitals filed for this visit.                      [facility] Adult PT Treatment/Exercise - 03/21/20 0001      Knee/Hip Exercises: Aerobic   Nustep not included in time or billing; at EOS 8 minutes UE/LE Lv 1      Knee/Hip Exercises: Standing   Heel Raises Both;15 reps    SLS with Vectors 5x5" with HHA    Other Standing Knee Exercises Foam tandem stance 3x 15" on foam with intermittent HHA    Other Standing Knee Exercises step over 6" hurdles (3) 2RT alternating LE       Knee/Hip Exercises: Seated   Sit to Sand 2 sets;5 reps                    PT Short Term Goals - 03/19/20 1031      PT SHORT TERM GOAL #1   Title Patient will be independent with initial HEP to improve functional outcomes    Baseline Reports compliant    Time 3    Period Weeks    Status Achieved    Target Date 02/10/20      PT SHORT TERM GOAL #2   Title Patient will be able to ambulate at least 150 feet during 2MWT with LRAD to demonstrate improved ability to perform functional mobility and associated tasks.    Baseline 220 feet with no AD    Time 3    Period Weeks    Status Achieved    Target Date 02/10/20             PT Long Term Goals - 03/19/20 1031      PT LONG TERM GOAL #1   Title Patient will demonstrate an improvement on FOTO of 10% as evidence of patient's perceived improvement  in functional mobility.     Baseline difficulty answering questions    Time 6    Period Weeks    Status Deferred      PT LONG TERM GOAL #2   Title Patient will be able to ambulate at least 250 feet during 2MWT with LRAD to demonstrate improved ability to perform functional mobility and associated tasks.    Baseline 220 feet with no AD    Time 6    Period Weeks    Status On-going      PT LONG TERM GOAL #3   Title Patient will have equal to or > 4/5 MMT throughout BLE to improve ability to perform functional mobility, stair ambulation and ADLs.    Baseline see MMT    Time 6    Period Weeks    Status Partially Met      PT LONG TERM GOAL #4   Title Patient will be able to perform stand x 5 in < 15 seconds to demonstrate improvement in functional mobility and reduced risk for falls.    Baseline 16 sec (was 12 sec)    Time 6    Period Weeks    Status On-going                 Plan - 03/21/20 1204    Clinical Impression Statement Pt with noted antalgia this session and reports of soreness across her hips from her fall Sunday night.  Pt requires constant verbal cues  and more difficulty staying on task this session.  Easily distracted requiring frequent redirection, unable to reproduce repetitive tasks due to forgetting what was just done.  Unable to complete but a fraction of activities this session due to taking extra instructional time with all today. Pt requested to complete nustep at EOS.  Able to continue for 8 minutes using both UE/LE (this was not included in time or billing of session).    Personal Factors and Comorbidities Age;Time since onset of injury/illness/exacerbation    Examination-Activity Limitations Bathing;Squat;Bend;Bed Mobility;Stairs;Stand;Transfers;Dressing;Hygiene/Grooming;Lift;Locomotion Level    Examination-Participation Restrictions Community Activity;Volunteer;Laundry;Yard Work    Stability/Clinical Decision Making Stable/Uncomplicated    Rehab Potential Fair    PT Frequency 2x / week    PT Duration 3 weeks    PT Treatment/Interventions ADLs/Self Care Home Management;Aquatic Therapy;Biofeedback;Cryotherapy;Electrical Stimulation;Iontophoresis 43m/ml Dexamethasone;Moist Heat;Traction;Balance training;Manual techniques;Therapeutic exercise;Vestibular;Splinting;Taping;Vasopneumatic Device;Functional mobility training;Therapeutic activities;Orthotic Fit/Training;Gait training;Stair training;DME Instruction;Patient/family education;Energy conservation;Dry needling;Joint Manipulations;Spinal Manipulations;Passive range of motion;Ultrasound;Parrafin;Fluidtherapy;Contrast Bath;Neuromuscular re-education;Compression bandaging    PT Next Visit Plan Progress functional strength and balance as able. Continue with foam to static balance and quad/hip strengthening.    PT Home Exercise Plan 4/20: hamstring stretch, prone hip extension, sidelying hip abduction, bridge, SLR  4/29: hip excursions; 5/6 lateral stepping, 02/15/20: supine clam, heel slides    Consulted and Agree with Plan of Care Patient           Patient will benefit from skilled  therapeutic intervention in order to improve the following deficits and impairments:  Abnormal gait, Decreased endurance, Hypomobility, Decreased activity tolerance, Decreased strength, Pain, Decreased balance, Decreased mobility, Difficulty walking, Decreased range of motion, Postural dysfunction, Improper body mechanics  Visit Diagnosis: Chronic low back pain, unspecified back pain laterality, unspecified whether sciatica present  Other abnormalities of gait and mobility  Pain in right hip     Problem List Patient Active Problem List   Diagnosis Date Noted  . S/P ORIF (open reduction internal fixation) fracture rIght hip IM Nail 12/21/12 12/14/2017  . Effusion  of knee joint 10/05/2014  . Acute blood loss anemia 12/22/2012  . Closed hip fracture ([place]) 12/21/2012  . Intertrochanteric fracture of right hip ([place]) 12/21/2012   Teena Irani, PTA/CLT [phone]  Teena Irani 03/21/2020, 12:05 PM  [facility] [address] [place], [place], [address] Phone: [phone]   Fax:  [phone]  Name: Angela [name] MRN: [id number] Date of Birth: Sep 23, 1941

## 2020-03-26 ENCOUNTER — Ambulatory Visit (HOSPITAL_COMMUNITY): Payer: Medicare PPO

## 2020-03-26 DIAGNOSIS — R634 Abnormal weight loss: Secondary | ICD-10-CM | POA: Diagnosis not present

## 2020-03-26 DIAGNOSIS — L237 Allergic contact dermatitis due to plants, except food: Secondary | ICD-10-CM | POA: Diagnosis not present

## 2020-03-26 DIAGNOSIS — R296 Repeated falls: Secondary | ICD-10-CM | POA: Diagnosis not present

## 2020-03-26 DIAGNOSIS — R42 Dizziness and giddiness: Secondary | ICD-10-CM | POA: Diagnosis not present

## 2020-03-26 DIAGNOSIS — I951 Orthostatic hypotension: Secondary | ICD-10-CM | POA: Diagnosis not present

## 2020-03-27 ENCOUNTER — Ambulatory Visit (HOSPITAL_COMMUNITY): Payer: Medicare PPO

## 2020-03-27 ENCOUNTER — Other Ambulatory Visit: Payer: Self-pay

## 2020-03-27 ENCOUNTER — Encounter (HOSPITAL_COMMUNITY): Payer: Self-pay

## 2020-03-27 ENCOUNTER — Ambulatory Visit (HOSPITAL_COMMUNITY): Payer: Medicare PPO | Admitting: Physical Therapy

## 2020-03-27 DIAGNOSIS — M545 Low back pain, unspecified: Secondary | ICD-10-CM

## 2020-03-27 DIAGNOSIS — R2689 Other abnormalities of gait and mobility: Secondary | ICD-10-CM | POA: Diagnosis not present

## 2020-03-27 DIAGNOSIS — G8929 Other chronic pain: Secondary | ICD-10-CM | POA: Diagnosis not present

## 2020-03-27 DIAGNOSIS — M25551 Pain in right hip: Secondary | ICD-10-CM | POA: Diagnosis not present

## 2020-03-27 NOTE — Therapy (Signed)
[place], [place], [address] Phone: [phone]   Fax:  [phone]  Physical Therapy Treatment  Patient Details  Name: Angela [name] MRN: [id number] Date of Birth: 12/03/1940 Referring Provider (PT): Arther Abbott MD    Encounter Date: 03/27/2020   PT End of Session - 03/27/20 1726    Visit Number 17    Number of Visits 20    Date for PT Re-Evaluation 04/06/20    Authorization Type [employer]    Authorization Time Period 6 visits approved from 6/14-->04/06/20    Authorization - Visit Number 16   2 out of 6 approved 6/14-->04/06/20   Authorization - Number of Visits 20    Progress Note Due on Visit 25    PT Start Time 1710    PT Stop Time 1800   8 min on Nustep, not included wiht charge   PT Time Calculation (min) 50 min    Equipment Utilized During Treatment Gait belt    Activity Tolerance Patient tolerated treatment well;Patient limited by pain    Behavior During Therapy [facility] for tasks assessed/performed           Past Medical History:  Diagnosis Date   Clostridium difficile infection 2010   Hip fx ([place])    Use of cane as ambulatory aid     Past Surgical History:  Procedure Laterality Date   INTRAMEDULLARY (IM) NAIL INTERTROCHANTERIC Right 12/21/2012   Procedure: INTRAMEDULLARY (IM) NAIL INTERTROCHANTRIC;  Surgeon: Carole Civil, MD;  Location: [facility];  Service: Orthopedics;  Laterality: Right;   SKIN BIOPSY     3x 2013   TONSILLECTOMY      There were no vitals filed for this visit.   Subjective Assessment - 03/27/20 1715    Subjective Pt stated she has a slide out of bed Sunday night, she crawled to storm door and yelled for neighbor to assist.  They called paramedics and helped her to her feet.m  Current pain scale 5/10 while standing.  Reports she feels the best after riding Nustep machine    Pertinent History RT hip ORIF in 2019    Patient Stated Goals "I don't know"     Currently in Pain? Yes    Pain Score 5     Pain Location Groin    Pain Orientation Right    Pain Descriptors / Indicators Sore;Aching    Pain Type Chronic pain    Pain Onset Yesterday    Pain Frequency Constant    Aggravating Factors  sitting still    Pain Relieving Factors standing, walking    Effect of Pain on Daily Activities limits                             [facility] Adult PT Treatment/Exercise - 03/27/20 0001      Ambulation/Gait   Ambulation/Gait Yes    Ambulation/Gait Assistance 5: Supervision    Ambulation Distance (Feet) 226 Feet    Assistive device None    Gait Pattern Decreased stance time - right;Decreased step length - right;Decreased step length - left;Decreased stride length;Trunk flexed;Narrow base of support    Ambulation Surface Level;Indoor    Gait Comments cueing to increase stride length to reduce shuffling mechanics      Knee/Hip Exercises: Aerobic   Nustep UE/LE seat 3 x 4mn at EOS (not included with charges)      Knee/Hip Exercises: Standing  Heel Raises Both;15 reps    Hip Flexion Both;10 reps    Hip Flexion Limitations toe tapping 6in step alternating    SLS with Vectors 5x5" with HHA    Other Standing Knee Exercises forward and lateral over 6in hurlder                    PT Short Term Goals - 03/19/20 1031      PT SHORT TERM GOAL #1   Title Patient will be independent with initial HEP to improve functional outcomes    Baseline Reports compliant    Time 3    Period Weeks    Status Achieved    Target Date 02/10/20      PT SHORT TERM GOAL #2   Title Patient will be able to ambulate at least 150 feet during 2MWT with LRAD to demonstrate improved ability to perform functional mobility and associated tasks.    Baseline 220 feet with no AD    Time 3    Period Weeks    Status Achieved    Target Date 02/10/20             PT Long Term Goals - 03/19/20 1031      PT LONG TERM GOAL #1   Title Patient will  demonstrate an improvement on FOTO of 10% as evidence of patient's perceived improvement in functional mobility.     Baseline difficulty answering questions    Time 6    Period Weeks    Status Deferred      PT LONG TERM GOAL #2   Title Patient will be able to ambulate at least 250 feet during 2MWT with LRAD to demonstrate improved ability to perform functional mobility and associated tasks.    Baseline 220 feet with no AD    Time 6    Period Weeks    Status On-going      PT LONG TERM GOAL #3   Title Patient will have equal to or > 4/5 MMT throughout BLE to improve ability to perform functional mobility, stair ambulation and ADLs.    Baseline see MMT    Time 6    Period Weeks    Status Partially Met      PT LONG TERM GOAL #4   Title Patient will be able to perform stand x 5 in < 15 seconds to demonstrate improvement in functional mobility and reduced risk for falls.    Baseline 16 sec (was 12 sec)    Time 6    Period Weeks    Status On-going                 Plan - 03/27/20 1807    Clinical Impression Statement Pt arrived with shuffled antalgic gait mechanics and reports soreness across her hip following fall Sunday night.  Pt required multimodal cueing to complete all activities this session and constant verbal cueing and demonstration to stay on task.  Pt educated on importnace of clearing feet during gait for fall prevention.  Added lateral hurdles step over for gluteal strengthening and SLS with gait, mod A required with task.  EOS on nustep with UE/LE for coordination, activity tolerance and strenghtening (not included wiht charges).    Personal Factors and Comorbidities Age;Time since onset of injury/illness/exacerbation    Examination-Activity Limitations Bathing;Squat;Bend;Bed Mobility;Stairs;Stand;Transfers;Dressing;Hygiene/Grooming;Lift;Locomotion Level    Examination-Participation Restrictions Community Activity;Volunteer;Laundry;Yard Work    Stability/Clinical  Decision Making Stable/Uncomplicated    Designer, jewellery Low  Rehab Potential Fair    PT Frequency 2x / week    PT Duration 3 weeks    PT Treatment/Interventions ADLs/Self Care Home Management;Aquatic Therapy;Biofeedback;Cryotherapy;Electrical Stimulation;Iontophoresis 55m/ml Dexamethasone;Moist Heat;Traction;Balance training;Manual techniques;Therapeutic exercise;Vestibular;Splinting;Taping;Vasopneumatic Device;Functional mobility training;Therapeutic activities;Orthotic Fit/Training;Gait training;Stair training;DME Instruction;Patient/family education;Energy conservation;Dry needling;Joint Manipulations;Spinal Manipulations;Passive range of motion;Ultrasound;Parrafin;Fluidtherapy;Contrast Bath;Neuromuscular re-education;Compression bandaging    PT Next Visit Plan Progress functional strength and balance as able. Continue with foam to static balance and quad/hip strengthening.    PT Home Exercise Plan 4/20: hamstring stretch, prone hip extension, sidelying hip abduction, bridge, SLR  4/29: hip excursions; 5/6 lateral stepping, 02/15/20: supine clam, heel slides           Patient will benefit from skilled therapeutic intervention in order to improve the following deficits and impairments:  Abnormal gait, Decreased endurance, Hypomobility, Decreased activity tolerance, Decreased strength, Pain, Decreased balance, Decreased mobility, Difficulty walking, Decreased range of motion, Postural dysfunction, Improper body mechanics  Visit Diagnosis: Chronic low back pain, unspecified back pain laterality, unspecified whether sciatica present  Other abnormalities of gait and mobility  Pain in right hip     Problem List Patient Active Problem List   Diagnosis Date Noted   S/P ORIF (open reduction internal fixation) fracture rIght hip IM Nail 12/21/12 12/14/2017   Effusion of knee joint 10/05/2014   Acute blood loss anemia 12/22/2012   [place] 12[place]    [place] of [place] 12[place]   CIhor Austin LPTA/CLT; CBIS [phone] CAldona Lento6/22/2021, [address] [id number]: [phone]  Fax:  [phone] Name: STARHONDA HOLLENBERGMRN: [id number] of Birth: [birth year]

## 2020-03-28 ENCOUNTER — Ambulatory Visit (HOSPITAL_COMMUNITY): Payer: Medicare PPO | Admitting: Physical Therapy

## 2020-03-28 ENCOUNTER — Encounter (HOSPITAL_COMMUNITY): Payer: Self-pay | Admitting: Physical Therapy

## 2020-03-28 DIAGNOSIS — M545 Low back pain, unspecified: Secondary | ICD-10-CM

## 2020-03-28 DIAGNOSIS — R2689 Other abnormalities of gait and mobility: Secondary | ICD-10-CM

## 2020-03-28 DIAGNOSIS — G8929 Other chronic pain: Secondary | ICD-10-CM | POA: Diagnosis not present

## 2020-03-28 DIAGNOSIS — M25551 Pain in right hip: Secondary | ICD-10-CM

## 2020-03-28 NOTE — Therapy (Signed)
[place], [place], [address] Phone: [phone]   Fax:  [phone]  Physical Therapy Treatment  Patient Details  Name: Angela [name] MRN: [id number] Date of Birth: June 29, 1941 Referring Provider (PT): Arther Abbott MD    Encounter Date: 03/28/2020   PT End of Session - 03/28/20 0951    Visit Number 18    Number of Visits 20    Date for PT Re-Evaluation 04/06/20    Authorization Type [employer]    Authorization Time Period 6 visits approved from 6/14-->04/06/20    Authorization - Visit Number 17   2 out of 6 approved 6/14-->04/06/20   Authorization - Number of Visits 20    Progress Note Due on Visit 25    PT Start Time 0948    PT Stop Time 1028    PT Time Calculation (min) 40 min    Equipment Utilized During Treatment Gait belt    Activity Tolerance Patient tolerated treatment well    Behavior During Therapy [facility] for tasks assessed/performed           Past Medical History:  Diagnosis Date  . Clostridium difficile infection 2010  . Hip fx ([place])   . Use of cane as ambulatory aid     Past Surgical History:  Procedure Laterality Date  . INTRAMEDULLARY (IM) NAIL INTERTROCHANTERIC Right 12/21/2012   Procedure: INTRAMEDULLARY (IM) NAIL INTERTROCHANTRIC;  Surgeon: Carole Civil, MD;  Location: [facility];  Service: Orthopedics;  Laterality: Right;  . SKIN BIOPSY     3x 2013  . TONSILLECTOMY      There were no vitals filed for this visit.   Subjective Assessment - 03/28/20 0950    Subjective Patient says she is still sore form the "tumble" she took the other week. Denies pain currenlty. Says "if I move funny it will give me a tweak".    Pertinent History RT hip ORIF in 2019    Patient Stated Goals "I don't know"    Currently in Pain? No/denies    Pain Onset [facility] Adult PT Treatment/Exercise - 03/28/20 0001      Knee/Hip Exercises:  Standing   Heel Raises Both;15 reps    Knee Flexion Both;2 sets;10 reps    Hip Abduction Both;2 sets;10 reps    Other Standing Knee Exercises alternating step taps on 4 inch box x20; tandem stance solid floor 2 x 30"; 1 x 30" on foam       Knee/Hip Exercises: Seated   Sit to Sand 1 set;10 reps;with UE support   slow and conrolled               Balance Exercises - 03/28/20 0001      Balance Exercises: Standing   Tandem Gait 2 reps    Sidestepping 2 reps    Step Over Hurdles / [place] 2RT over 4 inch hurdle                PT Short Term Goals - 03/19/20 1031      PT SHORT TERM GOAL #1   Title Patient will be independent with initial HEP to improve functional outcomes    Baseline Reports compliant    Time 3    Period Weeks    Status Achieved    Target Date 02/10/20  PT SHORT TERM GOAL #2   Title Patient will be able to ambulate at least 150 feet during 2MWT with LRAD to demonstrate improved ability to perform functional mobility and associated tasks.    Baseline 220 feet with no AD    Time 3    Period Weeks    Status Achieved    Target Date 02/10/20             PT Long Term Goals - 03/19/20 1031      PT LONG TERM GOAL #1   Title Patient will demonstrate an improvement on FOTO of 10% as evidence of patient's perceived improvement in functional mobility.     Baseline difficulty answering questions    Time 6    Period Weeks    Status Deferred      PT LONG TERM GOAL #2   Title Patient will be able to ambulate at least 250 feet during 2MWT with LRAD to demonstrate improved ability to perform functional mobility and associated tasks.    Baseline 220 feet with no AD    Time 6    Period Weeks    Status On-going      PT LONG TERM GOAL #3   Title Patient will have equal to or > 4/5 MMT throughout BLE to improve ability to perform functional mobility, stair ambulation and ADLs.    Baseline see MMT    Time 6    Period Weeks    Status Partially Met      PT  LONG TERM GOAL #4   Title Patient will be able to perform stand x 5 in < 15 seconds to demonstrate improvement in functional mobility and reduced risk for falls.    Baseline 16 sec (was 12 sec)    Time 6    Period Weeks    Status On-going                 Plan - 03/28/20 1028    Clinical Impression Statement Patient tolerated session well overall today. Patient noted some discomfort in RT hip with initial step ups on 4-inch step, but improved with reps. Patient continues to be well challenged with dynamic balance involving obstacles. Patient demos LOB x 2 with hurdle ambulation but was corrected with therapist assist. Patient shows good improvement in static balance and was able to progress to tandem stance on foam. Patient will continue to benefit from skilled therapy services to progress LE strength and balance to improve functional mobility and reduce risk of falls.    Personal Factors and Comorbidities Age;Time since onset of injury/illness/exacerbation    Examination-Activity Limitations Bathing;Squat;Bend;Bed Mobility;Stairs;Stand;Transfers;Dressing;Hygiene/Grooming;Lift;Locomotion Level    Examination-Participation Restrictions Community Activity;Volunteer;Laundry;Yard Work    Stability/Clinical Decision Making Stable/Uncomplicated    Rehab Potential Fair    PT Frequency 2x / week    PT Duration 3 weeks    PT Treatment/Interventions ADLs/Self Care Home Management;Aquatic Therapy;Biofeedback;Cryotherapy;Electrical Stimulation;Iontophoresis 13m/ml Dexamethasone;Moist Heat;Traction;Balance training;Manual techniques;Therapeutic exercise;Vestibular;Splinting;Taping;Vasopneumatic Device;Functional mobility training;Therapeutic activities;Orthotic Fit/Training;Gait training;Stair training;DME Instruction;Patient/family education;Energy conservation;Dry needling;Joint Manipulations;Spinal Manipulations;Passive range of motion;Ultrasound;Parrafin;Fluidtherapy;Contrast Bath;Neuromuscular  re-education;Compression bandaging    PT Next Visit Plan Progress functional strength and balance as able. Continue with foam to static balance and quad/hip strengthening.    PT Home Exercise Plan 4/20: hamstring stretch, prone hip extension, sidelying hip abduction, bridge, SLR  4/29: hip excursions; 5/6 lateral stepping, 02/15/20: supine clam, heel slides    Consulted and Agree with Plan of Care Patient  Patient will benefit from skilled therapeutic intervention in order to improve the following deficits and impairments:  Abnormal gait, Decreased endurance, Hypomobility, Decreased activity tolerance, Decreased strength, Pain, Decreased balance, Decreased mobility, Difficulty walking, Decreased range of motion, Postural dysfunction, Improper body mechanics  Visit Diagnosis: Chronic low back pain, unspecified back pain laterality, unspecified whether sciatica present  Other abnormalities of gait and mobility  Pain in right hip     Problem List Patient Active Problem List   Diagnosis Date Noted  . S/P ORIF (open reduction internal fixation) fracture rIght hip IM Nail 12/21/12 12/14/2017  . Effusion of knee joint 10/05/2014  . Acute blood loss anemia 12/22/2012  . Closed hip fracture ([place]) 12/21/2012  . Intertrochanteric fracture of right hip ([place]) 12/21/2012    12:21 PM, 03/28/20 Josue Hector PT DPT  Physical Therapist with [facility]  [phone] [place] [address] [place], [place], [address] Phone: [phone]   Fax:  [phone]  Name: Angela [name] MRN: [id number] Date of Birth: [birth year]

## 2020-04-03 ENCOUNTER — Encounter (HOSPITAL_COMMUNITY): Payer: Self-pay | Admitting: Physical Therapy

## 2020-04-03 ENCOUNTER — Other Ambulatory Visit: Payer: Self-pay

## 2020-04-03 ENCOUNTER — Ambulatory Visit (HOSPITAL_COMMUNITY): Payer: Medicare PPO | Admitting: Physical Therapy

## 2020-04-03 DIAGNOSIS — M25551 Pain in right hip: Secondary | ICD-10-CM

## 2020-04-03 DIAGNOSIS — R2689 Other abnormalities of gait and mobility: Secondary | ICD-10-CM | POA: Diagnosis not present

## 2020-04-03 DIAGNOSIS — M545 Low back pain, unspecified: Secondary | ICD-10-CM

## 2020-04-03 DIAGNOSIS — G8929 Other chronic pain: Secondary | ICD-10-CM | POA: Diagnosis not present

## 2020-04-03 NOTE — Therapy (Signed)
[facility] [address] [place], [place], [address] Phone: [phone]   Fax:  [phone]  Physical Therapy Treatment/ Discharge Summary  Patient Details  Name: KARALINE BURESH MRN: [id number] Date of Birth: 07-01-1941 Referring Provider (PT): Arther Abbott MD    Encounter Date: 04/03/2020   PHYSICAL THERAPY DISCHARGE SUMMARY  Visits from Start of Care: 19  Current functional level related to goals / functional outcomes: See below    Remaining deficits: See below   Education / Equipment: See assessment Plan: Patient agrees to discharge.  Patient goals were met. Patient is being discharged due to being pleased with the current functional level.  ?????        PT End of Session - 04/03/20 1122    Visit Number 19    Number of Visits 20    Date for PT Re-Evaluation 04/06/20    Authorization Type [employer]    Authorization Time Period 6 visits approved from 6/14-->04/06/20    Authorization - Visit Number 18   2 out of 6 approved 6/14-->04/06/20   Authorization - Number of Visits 20    Progress Note Due on Visit 25    PT Start Time 1115    PT Stop Time 1158    PT Time Calculation (min) 43 min    Equipment Utilized During Treatment --    Activity Tolerance Patient tolerated treatment well    Behavior During Therapy WFL for tasks assessed/performed           Past Medical History:  Diagnosis Date  . Clostridium difficile infection 2010  . Hip fx ([place])   . Use of cane as ambulatory aid     Past Surgical History:  Procedure Laterality Date  . INTRAMEDULLARY (IM) NAIL INTERTROCHANTERIC Right 12/21/2012   Procedure: INTRAMEDULLARY (IM) NAIL INTERTROCHANTRIC;  Surgeon: Carole Civil, MD;  Location: [facility];  Service: Orthopedics;  Laterality: Right;  . SKIN BIOPSY     3x 2013  . TONSILLECTOMY      There were no vitals filed for this visit.   Subjective Assessment - 04/03/20 1119    Subjective Patient  says she "feels like all the hard work is paying off". Says she notices she is walking better. Says she has not been having much pain lately. States she feels she has made 90-95% improvement since starting therapy.    Pertinent History RT hip ORIF in 2019    Limitations Lifting;Standing;Walking;House hold activities    Patient Stated Goals "I don't know"    Currently in Pain? No/denies    Pain Onset Efrain Sella PT Assessment - 04/03/20 0001      Assessment   Medical Diagnosis LBP    Referring Provider (PT) Arther Abbott MD     Prior Therapy yes      Precautions   Precautions Fall      Restrictions   Weight Bearing Restrictions No      Balance Screen   Has the patient fallen in the past 6 months Yes    How many times? 3    Has the patient had a decrease in activity level because of a fear of falling?  No    Is the patient reluctant to leave their home because of a fear of falling?  No      Home Ecologist residence    Living Arrangements Children  Available Help at Discharge Family    Type of [place] Access Level entry    [place] Two level;Able to live on main level with bedroom/bathroom      Prior Function   Level of Independence Independent with basic ADLs      Cognition   Overall Cognitive Status Difficult to assess      Observation/Other Assessments   Focus on Therapeutic Outcomes (FOTO)  40% limited    was 43% limited      Strength   Right Hip Flexion 4+/5    Right Hip ABduction 4+/5    Left Hip Flexion 5/5    Left Hip ABduction 5/5   was 4+   Right Knee Flexion 4+/5    Right Knee Extension 5/5    Left Knee Flexion 4+/5    Left Knee Extension 5/5    Right Ankle Dorsiflexion 5/5    Left Ankle Dorsiflexion 5/5      Transfers   Five time sit to stand comments  10.41 sec with no UE    was 16 sec no UE      Ambulation/Gait   Ambulation/Gait Yes    Ambulation/Gait Assistance 6: Modified  independent (Device/Increase time)    Ambulation Distance (Feet) 250 Feet    Assistive device None    Gait Pattern Decreased stance time - right;Decreased stride length;Narrow base of support    Ambulation Surface Level;Indoor    Gait Comments 2MWT                                 PT Education - 04/03/20 1121    Education Details on DC and transition to IND with HEP.    Person(s) Educated Patient    Methods Explanation;Handout    Comprehension Verbalized understanding            PT Short Term Goals - 03/19/20 1031      PT SHORT TERM GOAL #1   Title Patient will be independent with initial HEP to improve functional outcomes    Baseline Reports compliant    Time 3    Period Weeks    Status Achieved    Target Date 02/10/20      PT SHORT TERM GOAL #2   Title Patient will be able to ambulate at least 150 feet during 2MWT with LRAD to demonstrate improved ability to perform functional mobility and associated tasks.    Baseline 220 feet with no AD    Time 3    Period Weeks    Status Achieved    Target Date 02/10/20             PT Long Term Goals - 04/03/20 1142      PT LONG TERM GOAL #1   Title Patient will demonstrate an improvement on FOTO of 10% as evidence of patient's perceived improvement in functional mobility.     Baseline 6% improved    Time 6    Period Weeks    Status Not Met      PT LONG TERM GOAL #2   Title Patient will be able to ambulate at least 250 feet during 2MWT with LRAD to demonstrate improved ability to perform functional mobility and associated tasks.    Baseline 250 feet with no AD    Time 6    Period Weeks    Status Achieved      PT LONG  TERM GOAL #3   Title Patient will have equal to or > 4/5 MMT throughout BLE to improve ability to perform functional mobility, stair ambulation and ADLs.    Baseline see MMT    Time 6    Period Weeks    Status Achieved      PT LONG TERM GOAL #4   Title Patient will be able to  perform stand x 5 in < 15 seconds to demonstrate improvement in functional mobility and reduced risk for falls.    Baseline 10.4 sec with no UEs    Time 6    Period Weeks    Status Achieved                 Plan - 04/03/20 1211    Clinical Impression Statement Patient has made good progress toward therapy goals ands has currently met all functional goals. Patient did not meet improved FOTO score by 10%, however did improve beyond predicted value. Educated patient on transition to DC and independence with HEP, encouraged and answered all questions. Patient still with slight gait deviation, but is likely due to noted leg length discrepancy. Patient educated on option to follow up with MD for orthotic referral to address these issues if desired. Patient says she has dealt with this most of her life and it does not bother her. Patient has made significant improvement in static and dynamic balance, but discussed use of cane when outside on uneven surface, or in large crowded areas for improved balance. Educated patient on, and issued, updated HEP handout. Patient instructed to follow up with therapy services with any further questions or concerns.    Personal Factors and Comorbidities Age;Time since onset of injury/illness/exacerbation    Examination-Activity Limitations Bathing;Squat;Bend;Bed Mobility;Stairs;Stand;Transfers;Dressing;Hygiene/Grooming;Lift;Locomotion Level    Examination-Participation Restrictions Community Activity;Volunteer;Laundry;Yard Work    Stability/Clinical Decision Making Stable/Uncomplicated    Rehab Potential Fair    PT Treatment/Interventions ADLs/Self Care Home Management;Aquatic Therapy;Biofeedback;Cryotherapy;Electrical Stimulation;Iontophoresis 52m/ml Dexamethasone;Moist Heat;Traction;Balance training;Manual techniques;Therapeutic exercise;Vestibular;Splinting;Taping;Vasopneumatic Device;Functional mobility training;Therapeutic activities;Orthotic Fit/Training;Gait  training;Stair training;DME Instruction;Patient/family education;Energy conservation;Dry needling;Joint Manipulations;Spinal Manipulations;Passive range of motion;Ultrasound;Parrafin;Fluidtherapy;Contrast Bath;Neuromuscular re-education;Compression bandaging    PT Next Visit Plan DC    PT Home Exercise Plan 4/20: hamstring stretch, prone hip extension, sidelying hip abduction, bridge, SLR  4/29: hip excursions; 5/6 lateral stepping, 02/15/20: supine clam, heel slides    Consulted and Agree with Plan of Care Patient           Patient will benefit from skilled therapeutic intervention in order to improve the following deficits and impairments:  Abnormal gait, Decreased endurance, Hypomobility, Decreased activity tolerance, Decreased strength, Pain, Decreased balance, Decreased mobility, Difficulty walking, Decreased range of motion, Postural dysfunction, Improper body mechanics  Visit Diagnosis: Chronic low back pain, unspecified back pain laterality, unspecified whether sciatica present  Other abnormalities of gait and mobility  Pain in right hip     Problem List Patient Active Problem List   Diagnosis Date Noted  . S/P ORIF (open reduction internal fixation) fracture rIght hip IM [place] 12/21/12 12/14/2017  . Effusion of knee joint 10/05/2014  . Acute blood loss anemia 12/22/2012  . [place] [place] 12/[place]  . [place] [facility] right [place] 12/21/2012   12:13 PM, 04/03/20 CJosue HectorPT DPT  [facility] [phone] [address]Phone: [phone]  Fax:  [phone] Name: SKAYDYN CHISM  MRN: [id number] Date of Birth: [birth year]

## 2020-04-03 NOTE — Patient Instructions (Signed)
Access Code: [email or web address] URL: [email or web address] Date: 04/03/2020 Prepared by: Georges Lynch  Exercises Sit to Stand with Counter Support - 1 x daily - 4 x weekly - 1 sets - 10 reps Heel rises with counter support - 1 x daily - 4 x weekly - 1 sets - 10 reps Standing Hip Abduction with Counter Support - 1 x daily - 4 x weekly - 1 sets - 10 reps Standing Hip Extension with Counter Support - 1 x daily - 4 x weekly - 1 sets - 10 reps Standing Knee Flexion with Counter Support - 1 x daily - 4 x weekly - 1 sets - 10 reps Standing Tandem Balance with Counter Support - 1 x daily - 4 x weekly - 1 sets - 3 reps - 30 sec hold Standing Single Leg Stance with Counter Support - 1 x daily - 4 x weekly - 1 sets - 3 reps - 10 sec hold

## 2020-04-04 ENCOUNTER — Ambulatory Visit (HOSPITAL_COMMUNITY)
Admission: RE | Admit: 2020-04-04 | Discharge: 2020-04-04 | Disposition: A | Discharge status: Home / Self Care | Payer: Medicare PPO | Source: Ambulatory Visit | Attending: Internal Medicine | Admitting: Internal Medicine

## 2020-04-04 DIAGNOSIS — Z1231 Encounter for screening mammogram for malignant neoplasm of breast: Secondary | ICD-10-CM | POA: Insufficient documentation

## 2020-04-04 DIAGNOSIS — Z516 Encounter for desensitization to allergens: Secondary | ICD-10-CM | POA: Diagnosis not present

## 2020-04-05 ENCOUNTER — Encounter (HOSPITAL_COMMUNITY): Payer: Medicare PPO | Admitting: Physical Therapy

## 2020-04-10 ENCOUNTER — Encounter (HOSPITAL_COMMUNITY): Payer: Medicare PPO | Admitting: Physical Therapy

## 2020-04-12 ENCOUNTER — Encounter (HOSPITAL_COMMUNITY): Payer: Medicare PPO | Admitting: Physical Therapy

## 2020-05-14 ENCOUNTER — Other Ambulatory Visit: Payer: Self-pay

## 2020-05-14 ENCOUNTER — Encounter: Payer: Self-pay | Admitting: Orthopedic Surgery

## 2020-05-14 ENCOUNTER — Ambulatory Visit (INDEPENDENT_AMBULATORY_CARE_PROVIDER_SITE_OTHER): Payer: Medicare PPO | Admitting: Orthopedic Surgery

## 2020-05-14 VITALS — BP 133/90 | HR 71 | Ht 59.0 in | Wt 116.0 lb

## 2020-05-14 DIAGNOSIS — M7061 Trochanteric bursitis, right hip: Secondary | ICD-10-CM | POA: Diagnosis not present

## 2020-05-14 NOTE — Progress Notes (Signed)
Chief Complaint  Patient presents with  . Hip Pain    right hip chronic pain wants injection righthip//left side, hurts worse in last few weeks, had 2 falls 6 weeks ago and one was before that     Procedure note injection for right hip bursitis  Verbal consent was obtained for injection of the right hip  Timeout was completed to confirm the injection site  The medications used were 40 mg of Depo-Medrol and 1% lidocaine 3 cc  Anesthesia was provided by ethyl chloride and the skin was prepped with alcohol.  After cleaning the skin with alcohol a 25-gauge needle was used to inject the greater trochanteric bursa right hip   No complications were noted  Encounter Diagnosis  Name Primary?  . Trochanteric bursitis, right hip Yes

## 2020-05-16 DIAGNOSIS — Z6821 Body mass index (BMI) 21.0-21.9, adult: Secondary | ICD-10-CM | POA: Diagnosis not present

## 2020-05-16 DIAGNOSIS — G300 Alzheimer's disease with early onset: Secondary | ICD-10-CM | POA: Diagnosis not present

## 2020-05-16 DIAGNOSIS — G3184 Mild cognitive impairment, so stated: Secondary | ICD-10-CM | POA: Diagnosis not present

## 2020-05-16 DIAGNOSIS — M1611 Unilateral primary osteoarthritis, right hip: Secondary | ICD-10-CM | POA: Diagnosis not present

## 2020-05-16 DIAGNOSIS — R634 Abnormal weight loss: Secondary | ICD-10-CM | POA: Diagnosis not present

## 2020-06-21 DIAGNOSIS — Z961 Presence of intraocular lens: Secondary | ICD-10-CM | POA: Diagnosis not present

## 2020-06-21 DIAGNOSIS — H26492 Other secondary cataract, left eye: Secondary | ICD-10-CM | POA: Diagnosis not present

## 2020-06-21 DIAGNOSIS — H53022 Refractive amblyopia, left eye: Secondary | ICD-10-CM | POA: Diagnosis not present

## 2020-06-21 DIAGNOSIS — H04123 Dry eye syndrome of bilateral lacrimal glands: Secondary | ICD-10-CM | POA: Diagnosis not present

## 2020-06-25 ENCOUNTER — Ambulatory Visit: Payer: Medicare PPO

## 2020-06-25 ENCOUNTER — Other Ambulatory Visit: Payer: Self-pay

## 2020-06-25 ENCOUNTER — Ambulatory Visit (INDEPENDENT_AMBULATORY_CARE_PROVIDER_SITE_OTHER): Payer: Medicare PPO | Admitting: Orthopedic Surgery

## 2020-06-25 VITALS — BP 130/82 | HR 78 | Ht 59.0 in | Wt 114.0 lb

## 2020-06-25 DIAGNOSIS — M25551 Pain in right hip: Secondary | ICD-10-CM

## 2020-06-25 DIAGNOSIS — W19XXXA Unspecified fall, initial encounter: Secondary | ICD-10-CM | POA: Diagnosis not present

## 2020-06-25 NOTE — Patient Instructions (Signed)

## 2020-06-25 NOTE — Progress Notes (Signed)
Chief Complaint  Patient presents with  . Follow-up    Recheck on right hip.    Larey Seat last week hurts lower back across the bottom / small of the back   C/o thigh pain also   I rule out a hip fracture by getting a pelvis  We also injected her right hip for chronic pain for bursitis  Procedure note injection for right hip bursitis  Verbal consent was obtained for injection of the right hip  Timeout was completed to confirm the injection site  The medications used were 40 mg of Depo-Medrol and 1% lidocaine 3 cc  Anesthesia was provided by ethyl chloride and the skin was prepped with alcohol.  After cleaning the skin with alcohol a 25-gauge needle was used to inject the greater trochanteric bursa right hip   No complications were noted  She is moving her left hip very well however the right hip she has pain when she lifts her leg up in the air although she can lift it she has no neurovascular deficits or motor problems distally on either leg she is tender across the lower part of her back as well.  X-ray of the pelvis shows

## 2020-07-23 ENCOUNTER — Ambulatory Visit (INDEPENDENT_AMBULATORY_CARE_PROVIDER_SITE_OTHER): Payer: Medicare PPO | Admitting: Orthopedic Surgery

## 2020-07-23 ENCOUNTER — Other Ambulatory Visit: Payer: Self-pay

## 2020-07-23 VITALS — BP 130/88 | HR 74 | Ht 59.0 in | Wt 119.0 lb

## 2020-07-23 DIAGNOSIS — M7061 Trochanteric bursitis, right hip: Secondary | ICD-10-CM | POA: Diagnosis not present

## 2020-07-23 NOTE — Progress Notes (Signed)
Right hip pain Angela [name] was seen today for hip pain.  Diagnoses and all orders for this visit:  Trochanteric bursitis, right hip   Fu 1 month   HPI  Angela [name] is here for pain right hip status post right hip fracture nailing with history of right lower back pain and sciatica  Complains of pain over the right greater trochanter requests injection  Procedure note injection for right hip bursitis  Verbal consent was obtained for injection of the right hip  Timeout was completed to confirm the injection site  The medications used were 40 mg of Depo-Medrol and 1% lidocaine 3 cc  Anesthesia was provided by ethyl chloride and the skin was prepped with alcohol.  After cleaning the skin with alcohol a 25-gauge needle was used to inject the greater trochanteric bursa right hip   No complications were noted

## 2020-08-23 ENCOUNTER — Ambulatory Visit (INDEPENDENT_AMBULATORY_CARE_PROVIDER_SITE_OTHER): Payer: Medicare PPO | Admitting: Orthopedic Surgery

## 2020-08-23 ENCOUNTER — Other Ambulatory Visit: Payer: Self-pay

## 2020-08-23 VITALS — BP 135/86 | HR 76 | Ht 59.0 in | Wt 117.0 lb

## 2020-08-23 DIAGNOSIS — Z9889 Other specified postprocedural states: Secondary | ICD-10-CM

## 2020-08-23 DIAGNOSIS — M7061 Trochanteric bursitis, right hip: Secondary | ICD-10-CM | POA: Diagnosis not present

## 2020-08-23 DIAGNOSIS — Z8781 Personal history of (healed) traumatic fracture: Secondary | ICD-10-CM

## 2020-08-23 DIAGNOSIS — G8929 Other chronic pain: Secondary | ICD-10-CM

## 2020-08-23 NOTE — Progress Notes (Signed)
Encounter Diagnoses  Name Primary?  . Trochanteric bursitis, right hip Yes  . Chronic pain of right hip   . S/P ORIF (open reduction internal fixation) fracture rIght hip IM [place] 12/21/12     Chief Complaint  Patient presents with  . Follow-up    Recheck on right hip.    Procedure note injection for right hip bursitis  Verbal consent was obtained for injection of the right hip  Timeout was completed to confirm the injection site  The medications used were 40 mg of Depo-Medrol and 1% lidocaine 3 cc  Anesthesia was provided by ethyl chloride and the skin was prepped with alcohol.  After cleaning the skin with alcohol a 25-gauge needle was used to inject the greater trochanteric bursa right hip   No complications were noted

## 2020-09-24 ENCOUNTER — Ambulatory Visit: Payer: Medicare PPO | Admitting: Orthopedic Surgery

## 2020-09-24 ENCOUNTER — Encounter: Payer: Self-pay | Admitting: Orthopedic Surgery

## 2020-12-04 DIAGNOSIS — F0281 Dementia in other diseases classified elsewhere with behavioral disturbance: Secondary | ICD-10-CM | POA: Diagnosis not present

## 2020-12-04 DIAGNOSIS — R300 Dysuria: Secondary | ICD-10-CM | POA: Diagnosis not present

## 2020-12-04 DIAGNOSIS — F419 Anxiety disorder, unspecified: Secondary | ICD-10-CM | POA: Diagnosis not present

## 2020-12-04 DIAGNOSIS — N390 Urinary tract infection, site not specified: Secondary | ICD-10-CM | POA: Diagnosis not present

## 2020-12-21 DIAGNOSIS — G309 Alzheimer's disease, unspecified: Secondary | ICD-10-CM | POA: Diagnosis not present

## 2020-12-21 DIAGNOSIS — R300 Dysuria: Secondary | ICD-10-CM | POA: Diagnosis not present

## 2020-12-21 DIAGNOSIS — Z6821 Body mass index (BMI) 21.0-21.9, adult: Secondary | ICD-10-CM | POA: Diagnosis not present

## 2020-12-21 DIAGNOSIS — Z0000 Encounter for general adult medical examination without abnormal findings: Secondary | ICD-10-CM | POA: Diagnosis not present

## 2020-12-21 DIAGNOSIS — R531 Weakness: Secondary | ICD-10-CM | POA: Diagnosis not present

## 2020-12-21 DIAGNOSIS — E782 Mixed hyperlipidemia: Secondary | ICD-10-CM | POA: Diagnosis not present

## 2020-12-21 DIAGNOSIS — F419 Anxiety disorder, unspecified: Secondary | ICD-10-CM | POA: Diagnosis not present

## 2020-12-21 DIAGNOSIS — F0281 Dementia in other diseases classified elsewhere with behavioral disturbance: Secondary | ICD-10-CM | POA: Diagnosis not present

## 2020-12-21 DIAGNOSIS — S91332A Puncture wound without foreign body, left foot, initial encounter: Secondary | ICD-10-CM | POA: Diagnosis not present

## 2020-12-21 DIAGNOSIS — R030 Elevated blood-pressure reading, without diagnosis of hypertension: Secondary | ICD-10-CM | POA: Diagnosis not present

## 2020-12-21 DIAGNOSIS — R410 Disorientation, unspecified: Secondary | ICD-10-CM | POA: Diagnosis not present

## 2020-12-21 DIAGNOSIS — M180 Bilateral primary osteoarthritis of first carpometacarpal joints: Secondary | ICD-10-CM | POA: Diagnosis not present

## 2020-12-24 DIAGNOSIS — E782 Mixed hyperlipidemia: Secondary | ICD-10-CM | POA: Diagnosis not present

## 2020-12-24 DIAGNOSIS — Z0000 Encounter for general adult medical examination without abnormal findings: Secondary | ICD-10-CM | POA: Diagnosis not present

## 2020-12-24 DIAGNOSIS — Z0001 Encounter for general adult medical examination with abnormal findings: Secondary | ICD-10-CM | POA: Diagnosis not present

## 2020-12-24 DIAGNOSIS — F0281 Dementia in other diseases classified elsewhere with behavioral disturbance: Secondary | ICD-10-CM | POA: Diagnosis not present

## 2020-12-24 DIAGNOSIS — G309 Alzheimer's disease, unspecified: Secondary | ICD-10-CM | POA: Diagnosis not present

## 2020-12-24 DIAGNOSIS — R410 Disorientation, unspecified: Secondary | ICD-10-CM | POA: Diagnosis not present

## 2020-12-24 DIAGNOSIS — R030 Elevated blood-pressure reading, without diagnosis of hypertension: Secondary | ICD-10-CM | POA: Diagnosis not present

## 2020-12-24 DIAGNOSIS — G300 Alzheimer's disease with early onset: Secondary | ICD-10-CM | POA: Diagnosis not present

## 2020-12-24 DIAGNOSIS — Z6821 Body mass index (BMI) 21.0-21.9, adult: Secondary | ICD-10-CM | POA: Diagnosis not present

## 2020-12-27 DIAGNOSIS — R531 Weakness: Secondary | ICD-10-CM | POA: Diagnosis not present

## 2020-12-27 DIAGNOSIS — S91332A Puncture wound without foreign body, left foot, initial encounter: Secondary | ICD-10-CM | POA: Diagnosis not present

## 2020-12-27 DIAGNOSIS — E559 Vitamin D deficiency, unspecified: Secondary | ICD-10-CM | POA: Diagnosis not present

## 2020-12-27 DIAGNOSIS — R410 Disorientation, unspecified: Secondary | ICD-10-CM | POA: Diagnosis not present

## 2020-12-27 DIAGNOSIS — R030 Elevated blood-pressure reading, without diagnosis of hypertension: Secondary | ICD-10-CM | POA: Diagnosis not present

## 2020-12-27 DIAGNOSIS — Z0000 Encounter for general adult medical examination without abnormal findings: Secondary | ICD-10-CM | POA: Diagnosis not present

## 2020-12-27 DIAGNOSIS — Z6821 Body mass index (BMI) 21.0-21.9, adult: Secondary | ICD-10-CM | POA: Diagnosis not present

## 2020-12-27 DIAGNOSIS — F0281 Dementia in other diseases classified elsewhere with behavioral disturbance: Secondary | ICD-10-CM | POA: Diagnosis not present

## 2020-12-27 DIAGNOSIS — F419 Anxiety disorder, unspecified: Secondary | ICD-10-CM | POA: Diagnosis not present

## 2020-12-27 DIAGNOSIS — E782 Mixed hyperlipidemia: Secondary | ICD-10-CM | POA: Diagnosis not present

## 2020-12-27 DIAGNOSIS — G309 Alzheimer's disease, unspecified: Secondary | ICD-10-CM | POA: Diagnosis not present

## 2020-12-27 DIAGNOSIS — N390 Urinary tract infection, site not specified: Secondary | ICD-10-CM | POA: Diagnosis not present

## 2020-12-27 DIAGNOSIS — Z0001 Encounter for general adult medical examination with abnormal findings: Secondary | ICD-10-CM | POA: Diagnosis not present

## 2021-01-28 DIAGNOSIS — E782 Mixed hyperlipidemia: Secondary | ICD-10-CM | POA: Diagnosis not present

## 2021-01-28 DIAGNOSIS — F0281 Dementia in other diseases classified elsewhere with behavioral disturbance: Secondary | ICD-10-CM | POA: Diagnosis not present

## 2021-01-28 DIAGNOSIS — E559 Vitamin D deficiency, unspecified: Secondary | ICD-10-CM | POA: Diagnosis not present

## 2021-01-28 DIAGNOSIS — F419 Anxiety disorder, unspecified: Secondary | ICD-10-CM | POA: Diagnosis not present

## 2021-01-28 DIAGNOSIS — G309 Alzheimer's disease, unspecified: Secondary | ICD-10-CM | POA: Diagnosis not present

## 2021-01-29 DIAGNOSIS — R2689 Other abnormalities of gait and mobility: Secondary | ICD-10-CM | POA: Diagnosis not present

## 2021-01-30 DIAGNOSIS — F419 Anxiety disorder, unspecified: Secondary | ICD-10-CM | POA: Diagnosis not present

## 2021-01-30 DIAGNOSIS — G309 Alzheimer's disease, unspecified: Secondary | ICD-10-CM | POA: Diagnosis not present

## 2021-01-30 DIAGNOSIS — E782 Mixed hyperlipidemia: Secondary | ICD-10-CM | POA: Diagnosis not present

## 2021-01-30 DIAGNOSIS — F0281 Dementia in other diseases classified elsewhere with behavioral disturbance: Secondary | ICD-10-CM | POA: Diagnosis not present

## 2021-01-30 DIAGNOSIS — E559 Vitamin D deficiency, unspecified: Secondary | ICD-10-CM | POA: Diagnosis not present

## 2021-02-05 DIAGNOSIS — F0281 Dementia in other diseases classified elsewhere with behavioral disturbance: Secondary | ICD-10-CM | POA: Diagnosis not present

## 2021-02-05 DIAGNOSIS — E782 Mixed hyperlipidemia: Secondary | ICD-10-CM | POA: Diagnosis not present

## 2021-02-05 DIAGNOSIS — G309 Alzheimer's disease, unspecified: Secondary | ICD-10-CM | POA: Diagnosis not present

## 2021-02-05 DIAGNOSIS — E559 Vitamin D deficiency, unspecified: Secondary | ICD-10-CM | POA: Diagnosis not present

## 2021-02-05 DIAGNOSIS — F419 Anxiety disorder, unspecified: Secondary | ICD-10-CM | POA: Diagnosis not present

## 2021-02-13 DIAGNOSIS — F0281 Dementia in other diseases classified elsewhere with behavioral disturbance: Secondary | ICD-10-CM | POA: Diagnosis not present

## 2021-02-13 DIAGNOSIS — G309 Alzheimer's disease, unspecified: Secondary | ICD-10-CM | POA: Diagnosis not present

## 2021-02-13 DIAGNOSIS — E559 Vitamin D deficiency, unspecified: Secondary | ICD-10-CM | POA: Diagnosis not present

## 2021-02-13 DIAGNOSIS — F419 Anxiety disorder, unspecified: Secondary | ICD-10-CM | POA: Diagnosis not present

## 2021-02-13 DIAGNOSIS — E782 Mixed hyperlipidemia: Secondary | ICD-10-CM | POA: Diagnosis not present

## 2021-02-28 DIAGNOSIS — R2689 Other abnormalities of gait and mobility: Secondary | ICD-10-CM | POA: Diagnosis not present

## 2022-12-04 ENCOUNTER — Encounter: Payer: Self-pay | Admitting: Radiology

## 2023-02-04 DEATH — deceased

## 2023-05-11 ENCOUNTER — Other Ambulatory Visit (HOSPITAL_COMMUNITY)
Admission: RE | Admit: 2023-05-11 | Discharge: 2023-05-11 | Disposition: A | Discharge status: Home / Self Care | Source: Hospice | Attending: Hematology and Oncology | Admitting: Hematology and Oncology

## 2023-05-11 DIAGNOSIS — R350 Frequency of micturition: Secondary | ICD-10-CM | POA: Diagnosis present

## 2023-06-07 DEATH — deceased
